# Patient Record
Sex: Male | Born: 1950 | Race: White | Hispanic: No | Marital: Married | Smoking: Former smoker
Health system: Southern US, Community
[De-identification: ages and names within clinical notes are randomized; demographics above are authoritative.]

## PROBLEM LIST (undated history)

## (undated) DIAGNOSIS — M25559 Pain in unspecified hip: Secondary | ICD-10-CM

## (undated) DIAGNOSIS — I4891 Unspecified atrial fibrillation: Secondary | ICD-10-CM

## (undated) DIAGNOSIS — K635 Polyp of colon: Secondary | ICD-10-CM

## (undated) DIAGNOSIS — N503 Cyst of epididymis: Secondary | ICD-10-CM

## (undated) DIAGNOSIS — K219 Gastro-esophageal reflux disease without esophagitis: Secondary | ICD-10-CM

## (undated) DIAGNOSIS — K759 Inflammatory liver disease, unspecified: Secondary | ICD-10-CM

## (undated) HISTORY — DX: Polyp of colon: K63.5

## (undated) HISTORY — PX: APPENDECTOMY: SHX54

## (undated) HISTORY — DX: Gastro-esophageal reflux disease without esophagitis: K21.9

---
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
## 2001-02-16 ENCOUNTER — Emergency Department (HOSPITAL_COMMUNITY): Admission: EM | Admit: 2001-02-16 | Discharge: 2001-02-16 | Payer: Self-pay | Admitting: *Deleted

## 2001-04-27 ENCOUNTER — Encounter: Payer: Self-pay | Admitting: Gastroenterology

## 2001-04-27 ENCOUNTER — Encounter: Admission: RE | Admit: 2001-04-27 | Discharge: 2001-04-27 | Payer: Self-pay | Admitting: Gastroenterology

## 2002-08-29 ENCOUNTER — Encounter (INDEPENDENT_AMBULATORY_CARE_PROVIDER_SITE_OTHER): Payer: Self-pay | Admitting: Specialist

## 2002-08-29 ENCOUNTER — Ambulatory Visit (HOSPITAL_BASED_OUTPATIENT_CLINIC_OR_DEPARTMENT_OTHER): Admission: RE | Admit: 2002-08-29 | Discharge: 2002-08-29 | Payer: Self-pay | Admitting: Urology

## 2002-08-29 HISTORY — PX: EPIDIDYMECTOMY: SHX478

## 2004-05-13 ENCOUNTER — Ambulatory Visit: Payer: Self-pay | Admitting: Gastroenterology

## 2005-06-03 ENCOUNTER — Encounter: Admission: RE | Admit: 2005-06-03 | Discharge: 2005-06-03 | Payer: Self-pay | Admitting: Neurology

## 2005-11-08 LAB — HM COLONOSCOPY: HM Colonoscopy: NORMAL

## 2006-07-11 HISTORY — PX: NASAL SINUS SURGERY: SHX719

## 2006-10-09 ENCOUNTER — Ambulatory Visit (HOSPITAL_BASED_OUTPATIENT_CLINIC_OR_DEPARTMENT_OTHER): Admission: RE | Admit: 2006-10-09 | Discharge: 2006-10-09 | Payer: Self-pay | Admitting: Otolaryngology

## 2006-10-09 ENCOUNTER — Encounter (INDEPENDENT_AMBULATORY_CARE_PROVIDER_SITE_OTHER): Payer: Self-pay | Admitting: Specialist

## 2010-01-29 ENCOUNTER — Ambulatory Visit: Payer: Self-pay | Admitting: Internal Medicine

## 2010-01-29 DIAGNOSIS — J309 Allergic rhinitis, unspecified: Secondary | ICD-10-CM | POA: Insufficient documentation

## 2010-01-29 DIAGNOSIS — R05 Cough: Secondary | ICD-10-CM | POA: Insufficient documentation

## 2010-01-29 DIAGNOSIS — Z860100 Personal history of colon polyps, unspecified: Secondary | ICD-10-CM | POA: Insufficient documentation

## 2010-01-29 DIAGNOSIS — K219 Gastro-esophageal reflux disease without esophagitis: Secondary | ICD-10-CM | POA: Insufficient documentation

## 2010-01-29 DIAGNOSIS — R059 Cough, unspecified: Secondary | ICD-10-CM | POA: Insufficient documentation

## 2010-01-29 LAB — CONVERTED CEMR LAB
ALT: 21 units/L (ref 0–53)
AST: 26 units/L (ref 0–37)
Albumin: 4.7 g/dL (ref 3.5–5.2)
Alkaline Phosphatase: 66 units/L (ref 39–117)
BUN: 13 mg/dL (ref 6–23)
Basophils Absolute: 0 10*3/uL (ref 0.0–0.1)
Basophils Relative: 0.4 % (ref 0.0–3.0)
Bilirubin, Direct: 0.2 mg/dL (ref 0.0–0.3)
CO2: 27 meq/L (ref 19–32)
Calcium: 9.8 mg/dL (ref 8.4–10.5)
Chloride: 104 meq/L (ref 96–112)
Cholesterol: 184 mg/dL (ref 0–200)
Creatinine, Ser: 0.8 mg/dL (ref 0.4–1.5)
Eosinophils Absolute: 0.2 10*3/uL (ref 0.0–0.7)
Eosinophils Relative: 3 % (ref 0.0–5.0)
GFR calc non Af Amer: 106.5 mL/min (ref >60)
Glucose, Bld: 102 mg/dL — ABNORMAL HIGH (ref 70–99)
HCT: 48.4 % (ref 39.0–52.0)
HDL: 81.8 mg/dL (ref >39.00)
Hemoglobin: 17 g/dL (ref 13.0–17.0)
LDL Cholesterol: 91 mg/dL (ref 0–99)
Lymphocytes Relative: 20.6 % (ref 12.0–46.0)
Lymphs Abs: 1.5 10*3/uL (ref 0.7–4.0)
MCHC: 35 g/dL (ref 30.0–36.0)
MCV: 95 fL (ref 78.0–100.0)
Monocytes Absolute: 0.5 10*3/uL (ref 0.1–1.0)
Monocytes Relative: 6.7 % (ref 3.0–12.0)
Neutro Abs: 4.9 10*3/uL (ref 1.4–7.7)
Neutrophils Relative %: 69.3 % (ref 43.0–77.0)
Platelets: 218 10*3/uL (ref 150.0–400.0)
Potassium: 4.8 meq/L (ref 3.5–5.1)
RBC: 5.09 M/uL (ref 4.22–5.81)
RDW: 12.9 % (ref 11.5–14.6)
Sodium: 141 meq/L (ref 135–145)
TSH: 1.36 microintl units/mL (ref 0.35–5.50)
Total Bilirubin: 1.2 mg/dL (ref 0.3–1.2)
Total CHOL/HDL Ratio: 2
Total Protein: 7.1 g/dL (ref 6.0–8.3)
Triglycerides: 55 mg/dL (ref 0.0–149.0)
VLDL: 11 mg/dL (ref 0.0–40.0)
WBC: 7.1 10*3/uL (ref 4.5–10.5)

## 2010-02-03 ENCOUNTER — Ambulatory Visit: Payer: Self-pay | Admitting: Internal Medicine

## 2010-08-10 NOTE — Letter (Signed)
Summary: Out of Work  [facility]  [address] [place], [place] [address]   Phone: [phone]  Fax: [phone]    January 29, 2010   Employee:  GARON MELANDER    To Whom It May Concern:   For Medical reasons, please excuse the above named employee from work for the following dates:  Start:   01/25/10  End:   02/01/10  If you need additional information, please feel free to contact our office.         Sincerely,    Etta Grandchild MD

## 2010-08-10 NOTE — Assessment & Plan Note (Signed)
Summary: NEW PT PHY/BCBS/#/CD---STC   Vital Signs:  Patient profile:   60 year old male Height:      72 inches Weight:      160 pounds BMI:     21.78 O2 Sat:      98 % on Room air Temp:     97.5 degrees F oral Pulse rate:   75 / minute Pulse rhythm:   regular Resp:     16 per minute BP sitting:   128 / 82  (left arm) Cuff size:   large  Vitals Entered By: Rock Nephew CMA (January 29, 2010 9:16 AM)  O2 Flow:  Room air CC: New pt physical w/ labs// pt wants to discuss a cxr order, Preventive Care Is Patient Diabetic? No Pain Assessment Patient in pain? no        Primary Care Provider:  Etta Grandchild MD  CC:  New pt physical w/ labs// pt wants to discuss a cxr order and Preventive Care.  History of Present Illness: New to me he wants a complete physical done.  Preventive Screening-Counseling & Management  Alcohol-Tobacco     Alcohol drinks/day: <1     Alcohol type: all     >5/day in last 3 mos: no     Alcohol Counseling: not indicated; use of alcohol is not excessive or problematic     Feels need to cut down: no     Feels annoyed by complaints: no     Feels guilty re: drinking: no     Needs 'eye opener' in am: no     Smoking Status: quit > 6 months     Year Started: 1964     Year Quit: 1074     Pack years: 10     Tobacco Counseling: to remain off tobacco products  Caffeine-Diet-Exercise     Does Patient Exercise: yes  Hep-HIV-STD-Contraception     Hepatitis Risk: no risk noted     HIV Risk: no risk noted     STD Risk: no risk noted     TSE monthly: yes     Testicular SE Education/Counseling to perform regular STE  Safety-Violence-Falls     Seat Belt Use: yes     Helmet Use: yes     Firearms in the Home: no firearms in the home     Smoke Detectors: yes     Violence in the Home: no risk noted      Sexual History:  currently monogamous.        Drug Use:  never and no.        Blood Transfusions:  no.    Medications Prior to Update: 1)   None  Current Medications (verified): 1)  Vitamin D 1000 Unit Tabs (Cholecalciferol) .... Take 1 Tablet By Mouth Once A Day 2)  Multi Vitamin 3)  Asa 325mg  .... As Needed 4)  Betamethasone Dipropionate 0.05 % Crea (Betamethasone Dipropionate) .... As Needed 5)  Fluticasone Propionate 50 Mcg/act Susp (Fluticasone Propionate) .... As Directed  Allergies (verified): No Known Drug Allergies  Past History:  Family History: Last updated: 01/29/2010 Family History of Arthritis Family History of Stroke M 1st degree relative <50  Social History: Last updated: 01/29/2010 Retired Married Alcohol use-no Drug use-no Regular exercise-yes  Risk Factors: Alcohol Use: <1 (01/29/2010) >5 drinks/d w/in last 3 months: no (01/29/2010) Exercise: yes (01/29/2010)  Risk Factors: Smoking Status: quit > 6 months (01/29/2010)  Past Medical History: Allergic rhinitis GERD Colonic polyps, hx of  Past Surgical History: Epididyectomy 2004- he sees Dr. Jari Sportsman twice a year, last PSA=0.41 Oct 2010 Appendectomy  Family History: Reviewed history and no changes required. Family History of Arthritis Family History of Stroke M 1st degree relative <50  Social History: Reviewed history and no changes required. Retired Married Alcohol use-no Drug use-no Regular exercise-yes Drug Use:  never, no Does Patient Exercise:  yes Smoking Status:  quit > 6 months Hepatitis Risk:  no risk noted HIV Risk:  no risk noted STD Risk:  no risk noted Seat Belt Use:  yes Sexual History:  currently monogamous Blood Transfusions:  no  Review of Systems  The patient denies anorexia, fever, weight loss, weight gain, chest pain, syncope, dyspnea on exertion, peripheral edema, prolonged cough, headaches, hemoptysis, abdominal pain, melena, hematochezia, severe indigestion/heartburn, hematuria, suspicious skin lesions, difficulty walking, depression, enlarged lymph nodes, angioedema, and testicular masses.     Physical Exam  General:  alert, well-developed, well-nourished, well-hydrated, appropriate dress, normal appearance, healthy-appearing, cooperative to examination, and good hygiene.   Head:  normocephalic, atraumatic, no abnormalities observed, and no abnormalities palpated.   Eyes:  vision grossly intact.   Ears:  R ear normal and L ear normal.   Mouth:  Oral mucosa and oropharynx without lesions or exudates.  Teeth in good repair. Neck:  supple, full ROM, no masses, no thyromegaly, no thyroid nodules or tenderness, no JVD, normal carotid upstroke, no carotid bruits, no cervical lymphadenopathy, and no neck tenderness.   Chest Wall:  No deformities, masses, tenderness or gynecomastia noted. Breasts:  No masses or gynecomastia noted Lungs:  Normal respiratory effort, chest expands symmetrically. Lungs are clear to auscultation, no crackles or wheezes. Heart:  Normal rate and regular rhythm. S1 and S2 normal without gallop, murmur, click, rub or other extra sounds. Abdomen:  soft, non-tender, normal bowel sounds, no distention, no masses, no guarding, no rigidity, no rebound tenderness, no abdominal hernia, no inguinal hernia, no hepatomegaly, no splenomegaly, and abdominal scar(s).   Rectal:  No external abnormalities noted. Normal sphincter tone. No rectal masses or tenderness. Genitalia:  circumcised, no scrotal masses, no testicular masses or atrophy, no cutaneous lesions, no urethral discharge, and R spermatocele.   Prostate:  no gland enlargement, no nodules, no asymmetry, and no induration.   Msk:  normal ROM, no joint tenderness, no joint swelling, no joint warmth, no redness over joints, no joint deformities, no joint instability, no crepitation, and no muscle atrophy.   Pulses:  R and L carotid,radial,femoral,dorsalis pedis and posterior tibial pulses are full and equal bilaterally Extremities:  No clubbing, cyanosis, edema, or deformity noted with normal full range of motion of all  joints.   Neurologic:  No cranial nerve deficits noted. Station and gait are normal. Plantar reflexes are down-going bilaterally. DTRs are symmetrical throughout. Sensory, motor and coordinative functions appear intact. Skin:  turgor normal, no rashes, no suspicious lesions, no ecchymoses, no petechiae, no purpura, no ulcerations, no edema, and excessive tan.   Cervical Nodes:  no anterior cervical adenopathy and no posterior cervical adenopathy.   Axillary Nodes:  no R axillary adenopathy and no L axillary adenopathy.   Inguinal Nodes:  no R inguinal adenopathy and no L inguinal adenopathy.   Psych:  Cognition and judgment appear intact. Alert and cooperative with normal attention span and concentration. No apparent delusions, illusions, hallucinations   Impression & Recommendations:  Problem # 1:  ROUTINE GENERAL MEDICAL EXAM@HEALTH  CARE FACL (ICD-V70.0) Assessment New  Orders: Venipuncture ([address]) TLB-Lipid Panel (80061-LIPID)  TLB-BMP (Basic Metabolic Panel-BMET) (80048-METABOL) TLB-CBC Platelet - w/Differential (85025-CBCD) TLB-Hepatic/Liver Function Pnl (80076-HEPATIC) TLB-TSH (Thyroid Stimulating Hormone) (84443-TSH) EKG w/ Interpretation (93000)  Colonoscopy: normal (11/08/2005) Td Booster: Historical (07/11/2008)    Discussed using sunscreen, use of alcohol, drug use, self testicular exam, routine dental care, routine eye care, routine physical exam, seat belts, multiple vitamins,  and recommendations for immunizations.  Discussed exercise and checking cholesterol.  Also recommend checking PSA.  Complete Medication List: 1)  Vitamin D 1000 Unit Tabs (Cholecalciferol) .... Take 1 tablet by mouth once a day 2)  Multi Vitamin  3)  Asa 325mg   .... As needed 4)  Betamethasone Dipropionate 0.05 % Crea (Betamethasone dipropionate) .... As needed 5)  Fluticasone Propionate 50 Mcg/act Susp (Fluticasone propionate) .... As directed  Other Orders: T-2 View CXR (71020TC)  Colorectal  Screening:  Current Recommendations:    Hemoccult: NEG X 1 today    Colonoscopy recommended: scheduled with G.I.  PSA Screening:    Reviewed PSA screening recommendations: patient defers PSA but will reconsider in the future  Immunization & Chemoprophylaxis:    Tetanus vaccine: Historical  (07/11/2008)  Patient Instructions: 1)  Please schedule a follow-up appointment as needed. 2)  Schedule a colonoscopy/sigmoidoscopy to help detect colon cancer.  Preventive Care Screening  Last Tetanus Booster:    Date:  07/11/2008    Results:  Historical   Colonoscopy:    Date:  11/08/2005    Results:  normal

## 2010-11-26 NOTE — Op Note (Signed)
NAME:  Mark Cohen, Mark Cohen                       ACCOUNT NO.:  [id number]   MEDICAL RECORD NO.:  [id number]                   PATIENT TYPE:  AMB   LOCATION:  [facility]                                 FACILITY:  [facility]   PHYSICIAN:  Sigmund I. Patsi Sears, M.D.         DATE OF BIRTH:  18-Jul-1950   DATE OF PROCEDURE:  08/29/2002  DATE OF DISCHARGE:                                 OPERATIVE REPORT   PREOPERATIVE DIAGNOSIS:  Multiloculated left scrotal mass and varicocele.   POSTOPERATIVE DIAGNOSIS:  Multiloculated left scrotal mass and varicocele.   OPERATION:  1. Left internal spermatic vein ligation.  2. Left epididymal cyst removal.   SURGEON:  Sigmund I. Patsi Sears, M.D.   ANESTHESIA:  General (LMA).   PREPARATION:  After appropriate preanesthesia, the patient is brought to the  operating room and placed on the operating table in the dorsal supine  position where general LMA anesthesia was introduced.  He remained in this  position where the pubis was prepped with Betadine solution and draped in  the usual fashion.   DESCRIPTION OF PROCEDURE:  A left inguinal incision was made, measuring 12  cm, and subcutaneous tissue was dissected with the electrosurgical unit.  The fascia of the external oblique was entered, with care taken to avoid  injury to the ilioinguinal nerve.  The pubis was identified, and a Penrose  drain was placed at the level of the pubis.  The spermatic cord was then  dissected, and several large left spermatic veins were identified as well as  lymphatics, and these were all ligated with 3-0 silk suture.  Following  this, 0.25% Marcaine was injected into the spermatic cord and the wound  edges.   The testicle was then delivered into the wound, and the testicle was noted  to measure about 3 cm and was somewhat soft.  A very large, multiloculated  cystic mass was identified, and this was dissected.  This required incision  of the cyst and drainage of the cyst, in  order to remove the cyst and save  the vasculature to the testicle.  The previously dissected sperm granuloma  from prior vasectomy was excised as well.  The patient tolerated the  procedure well, and testicle was placed into the scrotum and sutured in  place with 3-0 Vicryl suture.  The patient underwent closure with 3-0 PDS  closure of the external iliac fascia and then 3-0 Vicryl closure of Scarpa's  fascia.  The skin was closed with skin stapler.  The patient was awakened  and taken to recovery room in good condition.                                               Sigmund I. Patsi Sears, M.D.    SIT/MEDQ  D:  08/29/2002  T:  08/29/2002  Job:  [id number]  

## 2010-11-26 NOTE — Op Note (Signed)
NAME:  Mark Cohen, Mark Cohen             ACCOUNT NO.:  [id number]   MEDICAL RECORD NO.:  [id number]          PATIENT TYPE:  AMB   LOCATION:  [facility]                          FACILITY:  [facility]   PHYSICIAN:  Jefry H. Pollyann Kennedy, MD     DATE OF BIRTH:  12/04/1950   DATE OF PROCEDURE:  10/09/2006  DATE OF DISCHARGE:                               OPERATIVE REPORT   PREOPERATIVE DIAGNOSIS:  Chronic ethmoid and maxillary sinusitis.   POSTOPERATIVE DIAGNOSIS:  Chronic ethmoid and maxillary sinusitis.   PROCEDURE:  Bilateral anterior endoscopic ethmoidectomy and bilateral  endoscopic maxillary antrostomy.   SURGEON:  Dr. Pollyann Kennedy   ANESTHESIA:  General endotracheal anesthesia was used.   COMPLICATIONS:  No complications.   BLOOD LOSS:  Less than 50 mL.   FINDINGS:  Diffuse polypoid and hyperplastic mucosa throughout the  ethmoid complex and the medial aspect of the maxillary sinuses  bilaterally.  There is a bony dehiscence of the left fovea but there was  no dura exposed and no CSF leak.  There was a bony dehiscence of the  right lamina papyracea but again no orbital contents or periorbita  exposed.   REFERRING PHYSICIAN:  [name]. Cohen, M.D.   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  Risks, benefits, alternatives,  complications of procedure explained to the patient who seemed to  understand and agreed to surgery.   PROCEDURE:  The patient was taken to the operating room, placed on the  operating table in supine position.  Following induction of general  endotracheal anesthesia the patient was prepped and draped in standard  fashion.  Afrin spray was used preoperatively in the nasal cavities.  1%  Xylocaine with epinephrine was infiltrated into the superior and  posterior attachments of the middle turbinates and a lateral nasal wall.   1. Bilateral  anterior endoscopic ethmoidectomy.  The uncinate process      was transected at its base with a sickle knife and then resected      with Wild Blakesley forceps.  A 0 and 30 degrees nasal endoscopes      were used throughout the procedure.  The microdebrider was then      used to complete an anterior ethmoid dissection.  The lateral limit      of dissection was the lamina papyracea bilaterally, the superior      limit the fovea and the posterior limit the ground lamella which      was kept intact.  Hyperplastic mucosa and multiple large polyps      were removed with other disease tissue.  There is no fungal type      debris or pus obtained.  This similar procedure was performed on      both sides.  The frontal recess was explored and on the left side      some hyperplastic mucosa was cleaned out of the frontal duct.  The      frontal duct was packed with Gelfoam bilaterally to  ensure patency      postoperatively.  Kennedy packs were placed in the ethmoid cavities      bilaterally at the termination of the procedure.   1. Bilateral maxillary antrostomy.  After the initial uncinate and the      bulla dissection the fontanelle was explored with a curved suction      and the maxillary sinus was entered on both sides.  A backbiting      forceps was used to enlarge the ostia anteriorly and on the left      side a flag forceps was used to enlarge inferiorly.  Posteriorly      the dissection was performed using a straight and angled Tru-Cut      forceps.  After the opening was enlarged as much as possible the      procedure was completed.  The nasal cavities and pharynx were      suctioned of blood and secretions.  The patient was awakened,      extubated, transferred to recovery in stable condition.      Jefry H. Pollyann Kennedy, MD  Electronically Signed     JHR/MEDQ  D:  10/09/2006  T:  10/09/2006  Job:  [id number]   cc:   Mark Cohen, M.D.

## 2011-04-22 ENCOUNTER — Encounter: Payer: Self-pay | Admitting: Internal Medicine

## 2011-04-28 ENCOUNTER — Other Ambulatory Visit (INDEPENDENT_AMBULATORY_CARE_PROVIDER_SITE_OTHER): Payer: BC Managed Care – PPO

## 2011-04-28 ENCOUNTER — Ambulatory Visit (INDEPENDENT_AMBULATORY_CARE_PROVIDER_SITE_OTHER): Payer: BC Managed Care – PPO | Admitting: Internal Medicine

## 2011-04-28 ENCOUNTER — Encounter: Payer: Self-pay | Admitting: Internal Medicine

## 2011-04-28 VITALS — BP 126/82 | HR 68 | Temp 98.2°F | Resp 16 | Wt 157.8 lb

## 2011-04-28 DIAGNOSIS — Z0000 Encounter for general adult medical examination without abnormal findings: Secondary | ICD-10-CM

## 2011-04-28 DIAGNOSIS — Z136 Encounter for screening for cardiovascular disorders: Secondary | ICD-10-CM | POA: Insufficient documentation

## 2011-04-28 LAB — LIPID PANEL
Cholesterol: 165 mg/dL (ref 0–200)
HDL: 78.4 mg/dL (ref >39.00)
Triglycerides: 28 mg/dL (ref 0.0–149.0)
VLDL: 5.6 mg/dL (ref 0.0–40.0)

## 2011-04-28 LAB — CBC WITH DIFFERENTIAL/PLATELET
Eosinophils Absolute: 0.2 10*3/uL (ref 0.0–0.7)
Lymphs Abs: 1.5 10*3/uL (ref 0.7–4.0)
MCHC: 33.5 g/dL (ref 30.0–36.0)
MCV: 95.8 fl (ref 78.0–100.0)
Monocytes Absolute: 0.5 10*3/uL (ref 0.1–1.0)
Neutrophils Relative %: 64.4 % (ref 43.0–77.0)
Platelets: 217 10*3/uL (ref 150.0–400.0)
RDW: 13 % (ref 11.5–14.6)

## 2011-04-28 LAB — URINALYSIS, ROUTINE W REFLEX MICROSCOPIC
Bilirubin Urine: NEGATIVE
Leukocytes, UA: NEGATIVE
Nitrite: NEGATIVE
Specific Gravity, Urine: >=1.03 (ref 1.000–1.030)
Total Protein, Urine: NEGATIVE
pH: 5.5 (ref 5.0–8.0)

## 2011-04-28 LAB — COMPREHENSIVE METABOLIC PANEL
AST: 27 U/L (ref 0–37)
Albumin: 4.3 g/dL (ref 3.5–5.2)
Alkaline Phosphatase: 65 U/L (ref 39–117)
Potassium: 4.6 mEq/L (ref 3.5–5.1)
Sodium: 141 mEq/L (ref 135–145)
Total Bilirubin: 1.3 mg/dL — ABNORMAL HIGH (ref 0.3–1.2)
Total Protein: 6.7 g/dL (ref 6.0–8.3)

## 2011-04-28 LAB — PSA: PSA: 0.4 ng/mL (ref 0.10–4.00)

## 2011-04-28 NOTE — Assessment & Plan Note (Signed)
His EKG is normal 

## 2011-04-28 NOTE — Progress Notes (Signed)
  Subjective:    Patient ID: Mark Cohen, male    DOB: June 20, 1951, 60 y.o.   MRN: [id number]  HPI He returns for a physical and he tells me that he feels well.   Review of Systems  Constitutional: Negative.   HENT: Negative.   Eyes: Negative.   Respiratory: Negative.   Cardiovascular: Negative.   Gastrointestinal: Negative.   Genitourinary: Negative.   Musculoskeletal: Negative.   Skin: Negative.   Neurological: Negative.   Hematological: Negative.   Psychiatric/Behavioral: Negative.        Objective:   Physical Exam  Vitals reviewed. Constitutional: He is oriented to person, place, and time. He appears well-developed and well-nourished. No distress.  HENT:  Head: Normocephalic.  Mouth/Throat: Oropharynx is clear and moist. No oropharyngeal exudate.  Eyes: Conjunctivae are normal. Right eye exhibits no discharge. Left eye exhibits no discharge. No scleral icterus.  Neck: Normal range of motion. Neck supple. No JVD present. No tracheal deviation present. No thyromegaly present.  Cardiovascular: Normal rate, regular rhythm, normal heart sounds and intact distal pulses.  Exam reveals no gallop and no friction rub.   No murmur heard. Pulmonary/Chest: Effort normal and breath sounds normal. No stridor. No respiratory distress. He has no wheezes. He has no rales. He exhibits no tenderness.  Abdominal: Soft. Bowel sounds are normal. He exhibits no distension and no mass. There is no tenderness. There is no rebound and no guarding. Hernia confirmed negative in the right inguinal area and confirmed negative in the left inguinal area.  Genitourinary: Rectum normal, testes normal and penis normal. Rectal exam shows no external hemorrhoid, no internal hemorrhoid, no fissure, no mass, no tenderness and anal tone normal. Guaiac negative stool. Prostate is not enlarged and not tender. Right testis shows no mass, no swelling and no tenderness. Right testis is descended. Left testis shows no  mass, no swelling and no tenderness. Left testis is descended. Circumcised. No penile tenderness. No discharge found.  Musculoskeletal: Normal range of motion. He exhibits no edema and no tenderness.  Lymphadenopathy:    He has no cervical adenopathy.       Right: No inguinal adenopathy present.       Left: No inguinal adenopathy present.  Neurological: He is oriented to person, place, and time. He displays normal reflexes. He exhibits normal muscle tone. Coordination normal.  Skin: Skin is warm and dry. No rash noted. He is not diaphoretic. No erythema. No pallor.  Psychiatric: He has a normal mood and affect. His behavior is normal. Judgment and thought content normal.          Assessment & Plan:

## 2011-04-28 NOTE — Assessment & Plan Note (Signed)
Exam done, labs ordered, pt ed material was given 

## 2011-04-28 NOTE — Patient Instructions (Signed)
Health Maintenance, Males A healthy lifestyle and preventative care can promote health and wellness.  Maintain regular health, dental, and eye exams.   Eat a healthy diet. Foods like vegetables, fruits, whole grains, low-fat dairy products, and lean protein foods contain the nutrients you need without too many calories. Decrease your intake of foods high in solid fats, added sugars, and salt. Get information about a proper diet from your caregiver, if necessary.   Regular physical exercise is one of the most important things you can do for your health. Most adults should get at least 150 minutes of moderate-intensity exercise (any activity that increases your heart rate and causes you to sweat) each week. In addition, most adults need muscle-strengthening exercises on 2 or more days a week.    Maintain a healthy weight. The body mass index (BMI) is a screening tool to identify possible weight problems. It provides an estimate of body fat based on height and weight. Your caregiver can help determine your BMI, and can help you achieve or maintain a healthy weight. For adults 20 years and older:   A BMI below 18.5 is considered underweight.   A BMI of 18.5 to 24.9 is normal.   A BMI of 25 to 29.9 is considered overweight.   A BMI of 30 and above is considered obese.   Maintain normal blood lipids and cholesterol by exercising and minimizing your intake of saturated fat. Eat a balanced diet with plenty of fruits and vegetables. Blood tests for lipids and cholesterol should begin at age 20 and be repeated every 5 years. If your lipid or cholesterol levels are high, you are over 50, or you are a high risk for heart disease, you may need your cholesterol levels checked more frequently.Ongoing high lipid and cholesterol levels should be treated with medicines, if diet and exercise are not effective.   If you smoke, find out from your caregiver how to quit. If you do not use tobacco, do not start.    If you choose to drink alcohol, do not exceed 2 drinks per day. One drink is considered to be 12 ounces (355 mL) of beer, 5 ounces (148 mL) of wine, or 1.5 ounces (44 mL) of liquor.   Avoid use of street drugs. Do not share needles with anyone. Ask for help if you need support or instructions about stopping the use of drugs.   High blood pressure causes heart disease and increases the risk of stroke. Blood pressure should be checked at least every 1 to 2 years. Ongoing high blood pressure should be treated with medicines if weight loss and exercise are not effective.   If you are 45 to 61 years old, ask your caregiver if you should take aspirin to prevent heart disease.   Diabetes screening involves taking a blood sample to check your fasting blood sugar level. This should be done once every 3 years, after age 45, if you are within normal weight and without risk factors for diabetes. Testing should be considered at a younger age or be carried out more frequently if you are overweight and have at least 1 risk factor for diabetes.   Colorectal cancer can be detected and often prevented. Most routine colorectal cancer screening begins at the age of 50 and continues through age 75. However, your caregiver may recommend screening at an earlier age if you have risk factors for colon cancer. On a yearly basis, your caregiver may provide home test kits to check for hidden   blood in the stool. Use of a small camera at the end of a tube, to directly examine the colon (sigmoidoscopy or colonoscopy), can detect the earliest forms of colorectal cancer. Talk to your caregiver about this at age 50, when routine screening begins. Direct examination of the colon should be repeated every 5 to 10 years through age 75, unless early forms of pre-cancerous polyps or small growths are found.   Healthy men should no longer receive prostate-specific antigen (PSA) blood tests as part of routine cancer screening. Consult with  your caregiver about prostate cancer screening.   Practice safe sex. Use condoms and avoid high-risk sexual practices to reduce the spread of sexually transmitted infections (STIs).   Use sunscreen with a sun protection factor (SPF) of 30 or greater. Apply sunscreen liberally and repeatedly throughout the day. You should seek shade when your shadow is shorter than you. Protect yourself by wearing long sleeves, pants, a wide-brimmed hat, and sunglasses year round, whenever you are outdoors.   Notify your caregiver of new moles or changes in moles, especially if there is a change in shape or color. Also notify your caregiver if a mole is larger than the size of a pencil eraser.   A one-time screening for abdominal aortic aneurysm (AAA) and surgical repair of large AAAs by sound wave imaging (ultrasonography) is recommended for ages 65 to 75 years who are current or former smokers.   Stay current with your immunizations.  Document Released: 12/24/2007 Document Revised: 03/09/2011 Document Reviewed: 11/22/2010 [employer] Patient Information 2012 [employer]. 

## 2011-07-29 ENCOUNTER — Other Ambulatory Visit: Payer: Self-pay | Admitting: Gastroenterology

## 2011-08-12 ENCOUNTER — Ambulatory Visit (INDEPENDENT_AMBULATORY_CARE_PROVIDER_SITE_OTHER): Payer: BC Managed Care – PPO | Admitting: Internal Medicine

## 2011-08-12 ENCOUNTER — Encounter: Payer: Self-pay | Admitting: Internal Medicine

## 2011-08-12 ENCOUNTER — Other Ambulatory Visit (INDEPENDENT_AMBULATORY_CARE_PROVIDER_SITE_OTHER): Payer: BC Managed Care – PPO

## 2011-08-12 VITALS — BP 144/96 | HR 70 | Temp 97.8°F | Resp 16 | Wt 160.0 lb

## 2011-08-12 DIAGNOSIS — M25552 Pain in left hip: Secondary | ICD-10-CM

## 2011-08-12 DIAGNOSIS — M545 Low back pain, unspecified: Secondary | ICD-10-CM

## 2011-08-12 DIAGNOSIS — I10 Essential (primary) hypertension: Secondary | ICD-10-CM

## 2011-08-12 DIAGNOSIS — M25559 Pain in unspecified hip: Secondary | ICD-10-CM

## 2011-08-12 DIAGNOSIS — M5416 Radiculopathy, lumbar region: Secondary | ICD-10-CM | POA: Insufficient documentation

## 2011-08-12 DIAGNOSIS — R739 Hyperglycemia, unspecified: Secondary | ICD-10-CM | POA: Insufficient documentation

## 2011-08-12 DIAGNOSIS — R7309 Other abnormal glucose: Secondary | ICD-10-CM

## 2011-08-12 LAB — BASIC METABOLIC PANEL
BUN: 13 mg/dL (ref 6–23)
Calcium: 9.4 mg/dL (ref 8.4–10.5)
Creatinine, Ser: 0.8 mg/dL (ref 0.4–1.5)
GFR: 102.94 mL/min (ref >60.00)
Glucose, Bld: 101 mg/dL — ABNORMAL HIGH (ref 70–99)

## 2011-08-12 MED ORDER — OLMESARTAN MEDOXOMIL 40 MG PO TABS: 40.0000 mg | ORAL_TABLET | Freq: Every day | ORAL | Status: DC

## 2011-08-12 MED ORDER — METHYLPREDNISOLONE 4 MG PO KIT: PACK | ORAL | Status: AC

## 2011-08-12 NOTE — Assessment & Plan Note (Signed)
He will take a course of medrol dose pak for the pain and today I will check plain films to look for spondylosis, occult fracture, etc

## 2011-08-12 NOTE — Progress Notes (Signed)
Subjective:    Patient ID: Mark Cohen, male    DOB: 04/22/1951, 61 y.o.   MRN: [id number]  Hypertension This is a new problem. The current episode started more than 1 month ago. The problem has been gradually worsening since onset. The problem is uncontrolled. Pertinent negatives include no anxiety, blurred vision, chest pain, headaches, malaise/fatigue, neck pain, orthopnea, palpitations, peripheral edema, PND, shortness of breath or sweats.  Back Pain This is a chronic problem. The current episode started more than 1 month ago. The problem occurs intermittently. The problem is unchanged. The pain is present in the lumbar spine. The quality of the pain is described as aching. The pain radiates to the left thigh. The pain is at a severity of 2/10. The pain is mild. The pain is worse during the day. The symptoms are aggravated by position. Stiffness is present in the morning. Associated symptoms include leg pain (left hip). Pertinent negatives include no abdominal pain, bladder incontinence, bowel incontinence, chest pain, dysuria, fever, headaches, numbness, paresis, paresthesias, pelvic pain, perianal numbness, tingling, weakness or weight loss. Risk factors include history of steroid use. Treatments tried: steroids. The treatment provided significant relief.      Review of Systems  Constitutional: Negative for fever, chills, weight loss, malaise/fatigue, diaphoresis, activity change, appetite change, fatigue and unexpected weight change.  HENT: Negative.  Negative for neck pain.   Eyes: Negative.  Negative for blurred vision.  Respiratory: Negative for cough, chest tightness, shortness of breath and wheezing.   Cardiovascular: Negative for chest pain, palpitations, orthopnea, leg swelling and PND.  Gastrointestinal: Negative for nausea, vomiting, abdominal pain, diarrhea, constipation, blood in stool, abdominal distention, anal bleeding and bowel incontinence.  Genitourinary: Negative.   Negative for bladder incontinence, dysuria and pelvic pain.  Musculoskeletal: Positive for back pain and arthralgias (left hip). Negative for myalgias, joint swelling and gait problem.  Skin: Negative for color change, pallor, rash and wound.  Neurological: Negative for dizziness, tingling, tremors, seizures, syncope, facial asymmetry, speech difficulty, weakness, light-headedness, numbness, headaches and paresthesias.  Hematological: Negative for adenopathy. Does not bruise/bleed easily.  Psychiatric/Behavioral: Negative.        Objective:   Physical Exam  Vitals reviewed. Constitutional: He is oriented to person, place, and time. He appears well-developed and well-nourished. No distress.  HENT:  Head: Normocephalic and atraumatic.  Mouth/Throat: Oropharynx is clear and moist. No oropharyngeal exudate.  Eyes: Conjunctivae are normal. Right eye exhibits no discharge. Left eye exhibits no discharge. No scleral icterus.  Neck: Normal range of motion. Neck supple. No JVD present. No tracheal deviation present. No thyromegaly present.  Cardiovascular: Normal rate, regular rhythm, normal heart sounds and intact distal pulses.  Exam reveals no gallop and no friction rub.   No murmur heard. Pulmonary/Chest: Effort normal and breath sounds normal. No stridor. No respiratory distress. He has no wheezes. He has no rales. He exhibits no tenderness.  Abdominal: Soft. Bowel sounds are normal. He exhibits no distension and no mass. There is no tenderness. There is no rebound and no guarding.  Musculoskeletal: Normal range of motion. He exhibits no edema and no tenderness.       Left hip: Normal. He exhibits normal range of motion, normal strength, no tenderness, no bony tenderness, no swelling, no crepitus, no deformity and no laceration.       Lumbar back: Normal. He exhibits normal range of motion, no tenderness, no bony tenderness, no swelling, no edema, no deformity, no laceration, no pain, no spasm  and  normal pulse.  Lymphadenopathy:    He has no cervical adenopathy.  Neurological: He is alert and oriented to person, place, and time. He has normal strength. He displays no atrophy, no tremor and normal reflexes. No cranial nerve deficit or sensory deficit. He exhibits normal muscle tone. He displays a negative Romberg sign. He displays no seizure activity. Coordination and gait normal. He displays no Babinski's sign on the right side. He displays no Babinski's sign on the left side.  Reflex Scores:      Tricep reflexes are 1+ on the right side.      Bicep reflexes are 1+ on the right side and 1+ on the left side.      Brachioradialis reflexes are 1+ on the right side and 1+ on the left side.      Patellar reflexes are 1+ on the right side and 1+ on the left side.      Achilles reflexes are 1+ on the right side and 1+ on the left side. Skin: Skin is warm and dry. No rash noted. He is not diaphoretic. No erythema. No pallor.  Psychiatric: He has a normal mood and affect. His behavior is normal. Judgment and thought content normal.      Lab Results  Component Value Date   WBC 6.1 04/28/2011   HGB 16.0 04/28/2011   HCT 47.7 04/28/2011   PLT 217.0 04/28/2011   GLUCOSE 107* 04/28/2011   CHOL 165 04/28/2011   TRIG 28.0 04/28/2011   HDL 78.40 04/28/2011   LDLCALC 81 04/28/2011   ALT 23 04/28/2011   AST 27 04/28/2011   NA 141 04/28/2011   K 4.6 04/28/2011   CL 105 04/28/2011   CREATININE 0.9 04/28/2011   BUN 11 04/28/2011   CO2 29 04/28/2011   TSH 1.29 04/28/2011   PSA 0.40 04/28/2011      Assessment & Plan:

## 2011-08-12 NOTE — Assessment & Plan Note (Signed)
a1c today to check for DM II

## 2011-08-12 NOTE — Patient Instructions (Signed)
Back Pain, Adult Low back pain is very common. About 1 in 5 people have back pain.The cause of low back pain is rarely dangerous. The pain often gets better over time.About half of people with a sudden onset of back pain feel better in just 2 weeks. About 8 in 10 people feel better by 6 weeks.  CAUSES Some common causes of back pain include:  Strain of the muscles or ligaments supporting the spine.   Wear and tear (degeneration) of the spinal discs.   Arthritis.   Direct injury to the back.  DIAGNOSIS Most of the time, the direct cause of low back pain is not known.However, back pain can be treated effectively even when the exact cause of the pain is unknown.Answering your caregiver's questions about your overall health and symptoms is one of the most accurate ways to make sure the cause of your pain is not dangerous. If your caregiver needs more information, he or she may order lab work or imaging tests (X-rays or MRIs).However, even if imaging tests show changes in your back, this usually does not require surgery. HOME CARE INSTRUCTIONS For many people, back pain returns.Since low back pain is rarely dangerous, it is often a condition that people can learn to manageon their own.   Remain active. It is stressful on the back to sit or stand in one place. Do not sit, drive, or stand in one place for more than 30 minutes at a time. Take short walks on level surfaces as soon as pain allows.Try to increase the length of time you walk each day.   Do not stay in bed.Resting more than 1 or 2 days can delay your recovery.   Do not avoid exercise or work.Your body is made to move.It is not dangerous to be active, even though your back may hurt.Your back will likely heal faster if you return to being active before your pain is gone.   Pay attention to your body when you bend and lift. Many people have less discomfortwhen lifting if they bend their knees, keep the load close to their  bodies,and avoid twisting. Often, the most comfortable positions are those that put less stress on your recovering back.   Find a comfortable position to sleep. Use a firm mattress and lie on your side with your knees slightly bent. If you lie on your back, put a pillow under your knees.   Only take over-the-counter or prescription medicines as directed by your caregiver. Over-the-counter medicines to reduce pain and inflammation are often the most helpful.Your caregiver may prescribe muscle relaxant drugs.These medicines help dull your pain so you can more quickly return to your normal activities and healthy exercise.   Put ice on the injured area.   Put ice in a plastic bag.   Place a towel between your skin and the bag.   Leave the ice on for 15 to 20 minutes, 3 to 4 times a day for the first 2 to 3 days. After that, ice and heat may be alternated to reduce pain and spasms.   Ask your caregiver about trying back exercises and gentle massage. This may be of some benefit.   Avoid feeling anxious or stressed.Stress increases muscle tension and can worsen back pain.It is important to recognize when you are anxious or stressed and learn ways to manage it.Exercise is a great option.  SEEK MEDICAL CARE IF:  You have pain that is not relieved with rest or medicine.   You have   pain that does not improve in 1 week.   You have new symptoms.   You are generally not feeling well.  SEEK IMMEDIATE MEDICAL CARE IF:   You have pain that radiates from your back into your legs.   You develop new bowel or bladder control problems.   You have unusual weakness or numbness in your arms or legs.   You develop nausea or vomiting.   You develop abdominal pain.   You feel faint.  Document Released: 06/27/2005 Document Revised: 03/09/2011 Document Reviewed: 11/15/2010 [employer] Patient Information 2012 [employer].Hypertension As your heart beats, it forces blood through your arteries. This  force is your blood pressure. If the pressure is too high, it is called hypertension (HTN) or high blood pressure. HTN is dangerous because you may have it and not know it. High blood pressure may mean that your heart has to work harder to pump blood. Your arteries may be narrow or stiff. The extra work puts you at risk for heart disease, stroke, and other problems.  Blood pressure consists of two numbers, a higher number over a lower, 110/72, for example. It is stated as "110 over 72." The ideal is below 120 for the top number (systolic) and under 80 for the bottom (diastolic). Write down your blood pressure today. You should pay close attention to your blood pressure if you have certain conditions such as:  Heart failure.   Prior heart attack.   Diabetes   Chronic kidney disease.   Prior stroke.   Multiple risk factors for heart disease.  To see if you have HTN, your blood pressure should be measured while you are seated with your arm held at the level of the heart. It should be measured at least twice. A one-time elevated blood pressure reading (especially in the Emergency Department) does not mean that you need treatment. There may be conditions in which the blood pressure is different between your right and left arms. It is important to see your caregiver soon for a recheck. Most people have essential hypertension which means that there is not a specific cause. This type of high blood pressure may be lowered by changing lifestyle factors such as:  Stress.   Smoking.   Lack of exercise.   Excessive weight.   Drug/tobacco/alcohol use.   Eating less salt.  Most people do not have symptoms from high blood pressure until it has caused damage to the body. Effective treatment can often prevent, delay or reduce that damage. TREATMENT  When a cause has been identified, treatment for high blood pressure is directed at the cause. There are a large number of medications to treat HTN. These  fall into several categories, and your caregiver will help you select the medicines that are best for you. Medications may have side effects. You should review side effects with your caregiver. If your blood pressure stays high after you have made lifestyle changes or started on medicines,   Your medication(s) may need to be changed.   Other problems may need to be addressed.   Be certain you understand your prescriptions, and know how and when to take your medicine.   Be sure to follow up with your caregiver within the time frame advised (usually within two weeks) to have your blood pressure rechecked and to review your medications.   If you are taking more than one medicine to lower your blood pressure, make sure you know how and at what times they should be taken. Taking   two medicines at the same time can result in blood pressure that is too low.  SEEK IMMEDIATE MEDICAL CARE IF:  You develop a severe headache, blurred or changing vision, or confusion.   You have unusual weakness or numbness, or a faint feeling.   You have severe chest or abdominal pain, vomiting, or breathing problems.  MAKE SURE YOU:   Understand these instructions.   Will watch your condition.   Will get help right away if you are not doing well or get worse.  Document Released: 06/27/2005 Document Revised: 03/09/2011 Document Reviewed: 02/15/2008 [employer] Patient Information 2012 [employer]. 

## 2011-08-12 NOTE — Assessment & Plan Note (Addendum)
I will check an xray to look for DJD, AVN, etc

## 2011-08-12 NOTE — Assessment & Plan Note (Signed)
Start benicar 

## 2011-08-14 ENCOUNTER — Encounter: Payer: Self-pay | Admitting: Internal Medicine

## 2013-01-18 ENCOUNTER — Encounter: Payer: Self-pay | Admitting: Internal Medicine

## 2013-01-18 ENCOUNTER — Ambulatory Visit (INDEPENDENT_AMBULATORY_CARE_PROVIDER_SITE_OTHER)
Admission: RE | Admit: 2013-01-18 | Discharge: 2013-01-18 | Disposition: A | Discharge status: Home / Self Care | Payer: BC Managed Care – PPO | Source: Ambulatory Visit | Attending: Internal Medicine | Admitting: Internal Medicine

## 2013-01-18 ENCOUNTER — Other Ambulatory Visit (INDEPENDENT_AMBULATORY_CARE_PROVIDER_SITE_OTHER): Payer: BC Managed Care – PPO

## 2013-01-18 ENCOUNTER — Ambulatory Visit (INDEPENDENT_AMBULATORY_CARE_PROVIDER_SITE_OTHER): Payer: BC Managed Care – PPO | Admitting: Internal Medicine

## 2013-01-18 VITALS — BP 130/82 | HR 64 | Temp 98.6°F | Resp 16 | Wt 169.2 lb

## 2013-01-18 DIAGNOSIS — Z23 Encounter for immunization: Secondary | ICD-10-CM

## 2013-01-18 DIAGNOSIS — Z0000 Encounter for general adult medical examination without abnormal findings: Secondary | ICD-10-CM

## 2013-01-18 DIAGNOSIS — I10 Essential (primary) hypertension: Secondary | ICD-10-CM

## 2013-01-18 DIAGNOSIS — M545 Low back pain, unspecified: Secondary | ICD-10-CM

## 2013-01-18 DIAGNOSIS — Z2911 Encounter for prophylactic immunotherapy for respiratory syncytial virus (RSV): Secondary | ICD-10-CM

## 2013-01-18 DIAGNOSIS — M25559 Pain in unspecified hip: Secondary | ICD-10-CM

## 2013-01-18 DIAGNOSIS — M25552 Pain in left hip: Secondary | ICD-10-CM

## 2013-01-18 LAB — COMPREHENSIVE METABOLIC PANEL
ALT: 19 U/L (ref 0–53)
AST: 25 U/L (ref 0–37)
Albumin: 4.6 g/dL (ref 3.5–5.2)
Calcium: 9.7 mg/dL (ref 8.4–10.5)
Chloride: 103 mEq/L (ref 96–112)
Creatinine, Ser: 0.9 mg/dL (ref 0.4–1.5)
Potassium: 4.5 mEq/L (ref 3.5–5.1)
Sodium: 139 mEq/L (ref 135–145)

## 2013-01-18 LAB — CBC WITH DIFFERENTIAL/PLATELET
Basophils Absolute: 0 10*3/uL (ref 0.0–0.1)
Eosinophils Absolute: 0.3 10*3/uL (ref 0.0–0.7)
Hemoglobin: 16.6 g/dL (ref 13.0–17.0)
Lymphocytes Relative: 23.9 % (ref 12.0–46.0)
MCHC: 34.3 g/dL (ref 30.0–36.0)
Neutro Abs: 4.1 10*3/uL (ref 1.4–7.7)
RDW: 13 % (ref 11.5–14.6)

## 2013-01-18 LAB — URINALYSIS, ROUTINE W REFLEX MICROSCOPIC
Bilirubin Urine: NEGATIVE
Ketones, ur: NEGATIVE
Leukocytes, UA: NEGATIVE
Specific Gravity, Urine: 1.015 (ref 1.000–1.030)
Urobilinogen, UA: 0.2 (ref 0.0–1.0)

## 2013-01-18 LAB — LIPID PANEL
LDL Cholesterol: 85 mg/dL (ref 0–99)
Total CHOL/HDL Ratio: 2

## 2013-01-18 MED ORDER — TRAMADOL HCL 50 MG PO TABS: 50.0000 mg | ORAL_TABLET | Freq: Three times a day (TID) | ORAL | Status: DC | PRN

## 2013-01-18 NOTE — Patient Instructions (Signed)
Hip Bursitis Bursitis is a swelling and soreness (inflammation) of a fluid-filled sac (bursa). This sac overlies and protects the joints.  CAUSES   Injury.  Overuse of the muscles surrounding the joint.  Arthritis.  Gout.  Infection.  Cold weather.  Inadequate warm-up and conditioning prior to activities. The cause may not be known.  SYMPTOMS   Mild to severe irritation.  Tenderness and swelling over the outside of the hip.  Pain with motion of the hip.  If the bursa becomes infected, a fever may be present. Redness, tenderness, and warmth will develop over the hip. Symptoms usually lessen in 3 to 4 weeks with treatment, but can come back. TREATMENT If conservative treatment does not work, your caregiver may advise draining the bursa and injecting cortisone into the area. This may speed up the healing process. This may also be used as an initial treatment of choice. HOME CARE INSTRUCTIONS   Apply ice to the affected area for 15-20 minutes every 3 to 4 hours while awake for the first 2 days. Put the ice in a plastic bag and place a towel between the bag of ice and your skin.  Rest the painful joint as much as possible, but continue to put the joint through a normal range of motion at least 4 times per day. When the pain lessens, begin normal, slow movements and usual activities to help prevent stiffness of the hip.  Only take over-the-counter or prescription medicines for pain, discomfort, or fever as directed by your caregiver.  Use crutches to limit weight bearing on the hip joint, if advised.  Elevate your painful hip to reduce swelling. Use pillows for propping and cushioning your legs and hips.  Gentle massage may provide comfort and decrease swelling. SEEK IMMEDIATE MEDICAL CARE IF:   Your pain increases even during treatment, or you are not improving.  You have a fever.  You have heat and inflammation over the involved bursa.  You have any other questions or  concerns. MAKE SURE YOU:   Understand these instructions.  Will watch your condition.  Will get help right away if you are not doing well or get worse. Document Released: 12/17/2001 Document Revised: 09/19/2011 Document Reviewed: 07/16/2008 [facility] Patient Information 2014 [place], [place]. Health Maintenance, Males A healthy lifestyle and preventative care can promote health and wellness.  Maintain regular health, dental, and eye exams.  Eat a healthy diet. Foods like vegetables, fruits, whole grains, low-fat dairy products, and lean protein foods contain the nutrients you need without too many calories. Decrease your intake of foods high in solid fats, added sugars, and salt. Get information about a proper diet from your caregiver, if necessary.  Regular physical exercise is one of the most important things you can do for your health. Most adults should get at least 150 minutes of moderate-intensity exercise (any activity that increases your heart rate and causes you to sweat) each week. In addition, most adults need muscle-strengthening exercises on 2 or more days a week.   Maintain a healthy weight. The body mass index (BMI) is a screening tool to identify possible weight problems. It provides an estimate of body fat based on height and weight. Your caregiver can help determine your BMI, and can help you achieve or maintain a healthy weight. For adults 20 years and older:  A BMI below 18.5 is considered underweight.  A BMI of 18.5 to 24.9 is normal.  A BMI of 25 to 29.9 is considered overweight.  A BMI  of 30 and above is considered obese.  Maintain normal blood lipids and cholesterol by exercising and minimizing your intake of saturated fat. Eat a balanced diet with plenty of fruits and vegetables. Blood tests for lipids and cholesterol should begin at age 58 and be repeated every 5 years. If your lipid or cholesterol levels are high, you are over 50, or you are a high risk for  heart disease, you may need your cholesterol levels checked more frequently.Ongoing high lipid and cholesterol levels should be treated with medicines, if diet and exercise are not effective.  If you smoke, find out from your caregiver how to quit. If you do not use tobacco, do not start.  If you choose to drink alcohol, do not exceed 2 drinks per day. One drink is considered to be 12 ounces (355 mL) of beer, 5 ounces (148 mL) of wine, or 1.5 ounces (44 mL) of liquor.  Avoid use of street drugs. Do not share needles with anyone. Ask for help if you need support or instructions about stopping the use of drugs.  High blood pressure causes heart disease and increases the risk of stroke. Blood pressure should be checked at least every 1 to 2 years. Ongoing high blood pressure should be treated with medicines if weight loss and exercise are not effective.  If you are 55 to 63 years old, ask your caregiver if you should take aspirin to prevent heart disease.  Diabetes screening involves taking a blood sample to check your fasting blood sugar level. This should be done once every 3 years, after age 41, if you are within normal weight and without risk factors for diabetes. Testing should be considered at a younger age or be carried out more frequently if you are overweight and have at least 1 risk factor for diabetes.  Colorectal cancer can be detected and often prevented. Most routine colorectal cancer screening begins at the age of 13 and continues through age 69. However, your caregiver may recommend screening at an earlier age if you have risk factors for colon cancer. On a yearly basis, your caregiver may provide home test kits to check for hidden blood in the stool. Use of a small camera at the end of a tube, to directly examine the colon (sigmoidoscopy or colonoscopy), can detect the earliest forms of colorectal cancer. Talk to your caregiver about this at age 57, when routine screening begins. Direct  examination of the colon should be repeated every 5 to 10 years through age 18, unless early forms of pre-cancerous polyps or small growths are found.  Hepatitis C blood testing is recommended for all people born from [birth year] through 1966 and any individual with known risks for hepatitis C.  Healthy men should no longer receive prostate-specific antigen (PSA) blood tests as part of routine cancer screening. Consult with your caregiver about prostate cancer screening.  Testicular cancer screening is not recommended for adolescents or adult males who have no symptoms. Screening includes self-exam, caregiver exam, and other screening tests. Consult with your caregiver about any symptoms you have or any concerns you have about testicular cancer.  Practice safe sex. Use condoms and avoid high-risk sexual practices to reduce the spread of sexually transmitted infections (STIs).  Use sunscreen with a sun protection factor (SPF) of 30 or greater. Apply sunscreen liberally and repeatedly throughout the day. You should seek shade when your shadow is shorter than you. Protect yourself by wearing long sleeves, pants, a wide-brimmed hat, and sunglasses year  round, whenever you are outdoors.  Notify your caregiver of new moles or changes in moles, especially if there is a change in shape or color. Also notify your caregiver if a mole is larger than the size of a pencil eraser.  A one-time screening for abdominal aortic aneurysm (AAA) and surgical repair of large AAAs by sound wave imaging (ultrasonography) is recommended for ages 102 to 82 years who are current or former smokers.  Stay current with your immunizations. Document Released: 12/24/2007 Document Revised: 09/19/2011 Document Reviewed: 11/22/2010 [facility] Patient Information 2014 [place], [place].

## 2013-01-18 NOTE — Assessment & Plan Note (Signed)
Plain film is + for DDD He has no s/s of radicular pathology Will continue nsaids and will try tramadol

## 2013-01-18 NOTE — Progress Notes (Signed)
Subjective:    Patient ID: Mark Cohen, male    DOB: [birth year], 62 y.o.   MRN: [id number]  Arthritis Presents for follow-up visit. He complains of pain. He reports no stiffness, joint swelling or joint warmth. The symptoms have been worsening. Affected locations include the left hip. His pain is at a severity of 3/10. Associated symptoms include pain at night and pain while resting. Pertinent negatives include no diarrhea, dry eyes, dry mouth, dysuria, fatigue, fever, rash, Raynaud's syndrome, uveitis or weight loss.      Review of Systems  Constitutional: Negative.  Negative for fever, chills, weight loss, diaphoresis, activity change, appetite change, fatigue and unexpected weight change.  HENT: Negative.   Eyes: Negative.   Respiratory: Negative.  Negative for cough, chest tightness, shortness of breath, wheezing and stridor.   Cardiovascular: Negative.  Negative for chest pain, palpitations and leg swelling.  Gastrointestinal: Negative.  Negative for nausea, vomiting, abdominal pain, diarrhea, constipation and blood in stool.  Endocrine: Negative.   Genitourinary: Negative.  Negative for dysuria, urgency, frequency, hematuria, flank pain, decreased urine volume, discharge, penile swelling, scrotal swelling, enuresis, difficulty urinating, genital sores, penile pain and testicular pain.  Musculoskeletal: Positive for back pain, arthralgias and arthritis. Negative for myalgias, joint swelling, gait problem and stiffness.  Skin: Negative.  Negative for color change, pallor, rash and wound.  Allergic/Immunologic: Negative.   Neurological: Negative.  Negative for dizziness.  Hematological: Negative.  Negative for adenopathy. Does not bruise/bleed easily.  Psychiatric/Behavioral: Negative.        Objective:   Physical Exam  Vitals reviewed. Constitutional: He is oriented to person, place, and time. He appears well-developed and well-nourished. No distress.  HENT:  Head:  Normocephalic and atraumatic.  Mouth/Throat: Oropharynx is clear and moist. No oropharyngeal exudate.  Eyes: Conjunctivae are normal. Right eye exhibits no discharge. Left eye exhibits no discharge. No scleral icterus.  Neck: Normal range of motion. Neck supple. No JVD present. No tracheal deviation present. No thyromegaly present.  Cardiovascular: Normal rate, regular rhythm, normal heart sounds and intact distal pulses.  Exam reveals no gallop and no friction rub.   No murmur heard. Pulmonary/Chest: Effort normal and breath sounds normal. No stridor. No respiratory distress. He has no wheezes. He has no rales. He exhibits no tenderness.  Abdominal: Soft. Bowel sounds are normal. He exhibits no distension and no mass. There is no tenderness. There is no rebound and no guarding. Hernia confirmed negative in the right inguinal area and confirmed negative in the left inguinal area.  Genitourinary: Rectum normal, prostate normal, testes normal and penis normal. Rectal exam shows no external hemorrhoid, no internal hemorrhoid, no fissure, no mass, no tenderness and anal tone normal. Guaiac negative stool. Prostate is not enlarged and not tender. Right testis shows no mass, no swelling and no tenderness. Right testis is descended. Left testis shows no mass, no swelling and no tenderness. Left testis is descended. Circumcised. No penile erythema or penile tenderness. No discharge found.  Left hemiscrotum is empty  Musculoskeletal: Normal range of motion. He exhibits no edema and no tenderness.       Left hip: He exhibits tenderness (over the GT). He exhibits normal range of motion, normal strength, no bony tenderness, no swelling, no crepitus, no deformity and no laceration.       Lumbar back: Normal. He exhibits normal range of motion, no tenderness, no bony tenderness, no swelling, no edema, no deformity, no laceration, no pain, no spasm and normal pulse.  Lymphadenopathy:    He has no cervical adenopathy.        Right: No inguinal adenopathy present.       Left: No inguinal adenopathy present.  Neurological: He is alert and oriented to person, place, and time. He has normal reflexes. He displays normal reflexes. No cranial nerve deficit. He exhibits normal muscle tone. Coordination normal.  Skin: Skin is warm and dry. No rash noted. He is not diaphoretic. No erythema. No pallor.  Psychiatric: He has a normal mood and affect. His behavior is normal. Judgment and thought content normal.      Lab Results  Component Value Date   WBC 6.1 04/28/2011   HGB 16.0 04/28/2011   HCT 47.7 04/28/2011   PLT 217.0 04/28/2011   GLUCOSE 101* 08/12/2011   CHOL 165 04/28/2011   TRIG 28.0 04/28/2011   HDL 78.40 04/28/2011   LDLCALC 81 04/28/2011   ALT 23 04/28/2011   AST 27 04/28/2011   NA 137 08/12/2011   K 3.9 08/12/2011   CL 103 08/12/2011   CREATININE 0.8 08/12/2011   BUN 13 08/12/2011   CO2 27 08/12/2011   TSH 1.29 04/28/2011   PSA 0.40 04/28/2011   HGBA1C 5.2 08/12/2011      Assessment & Plan:

## 2013-01-18 NOTE — Assessment & Plan Note (Signed)
Exam done Labs ordered Vaccines were reviewed and updated Pt ed material was given

## 2013-01-18 NOTE — Assessment & Plan Note (Signed)
He appears to have trochanteric bursitis Will try tramadol in addition to the nsaids He does not want to see ortho

## 2013-01-18 NOTE — Assessment & Plan Note (Signed)
His BP is well controlled 

## 2013-01-23 ENCOUNTER — Other Ambulatory Visit: Payer: Self-pay | Admitting: Physician Assistant

## 2013-02-22 ENCOUNTER — Ambulatory Visit (INDEPENDENT_AMBULATORY_CARE_PROVIDER_SITE_OTHER): Payer: BC Managed Care – PPO | Admitting: Internal Medicine

## 2013-02-22 ENCOUNTER — Encounter: Payer: Self-pay | Admitting: Internal Medicine

## 2013-02-22 VITALS — BP 138/86 | HR 68 | Temp 97.9°F | Resp 16 | Wt 168.0 lb

## 2013-02-22 DIAGNOSIS — I10 Essential (primary) hypertension: Secondary | ICD-10-CM

## 2013-02-22 DIAGNOSIS — IMO0002 Reserved for concepts with insufficient information to code with codable children: Secondary | ICD-10-CM

## 2013-02-22 DIAGNOSIS — M5416 Radiculopathy, lumbar region: Secondary | ICD-10-CM

## 2013-02-22 MED ORDER — CELECOXIB 200 MG PO CAPS: 200.0000 mg | ORAL_CAPSULE | Freq: Every day | ORAL | Status: DC

## 2013-02-22 MED ORDER — HYDROCODONE-ACETAMINOPHEN 5-325 MG PO TABS: 1.0000 | ORAL_TABLET | Freq: Four times a day (QID) | ORAL | Status: DC | PRN

## 2013-02-22 NOTE — Patient Instructions (Signed)
Back Pain, Adult  Low back pain is very common. About 1 in 5 people have back pain. The cause of low back pain is rarely dangerous. The pain often gets better over time. About half of people with a sudden onset of back pain feel better in just 2 weeks. About 8 in 10 people feel better by 6 weeks.   CAUSES  Some common causes of back pain include:  · Strain of the muscles or ligaments supporting the spine.  · Wear and tear (degeneration) of the spinal discs.  · Arthritis.  · Direct injury to the back.  DIAGNOSIS  Most of the time, the direct cause of low back pain is not known. However, back pain can be treated effectively even when the exact cause of the pain is unknown. Answering your caregiver's questions about your overall health and symptoms is one of the most accurate ways to make sure the cause of your pain is not dangerous. If your caregiver needs more information, he or she may order lab work or imaging tests (X-rays or MRIs). However, even if imaging tests show changes in your back, this usually does not require surgery.  HOME CARE INSTRUCTIONS  For many people, back pain returns. Since low back pain is rarely dangerous, it is often a condition that people can learn to manage on their own.   · Remain active. It is stressful on the back to sit or stand in one place. Do not sit, drive, or stand in one place for more than 30 minutes at a time. Take short walks on level surfaces as soon as pain allows. Try to increase the length of time you walk each day.  · Do not stay in bed. Resting more than 1 or 2 days can delay your recovery.  · Do not avoid exercise or work. Your body is made to move. It is not dangerous to be active, even though your back may hurt. Your back will likely heal faster if you return to being active before your pain is gone.  · Pay attention to your body when you  bend and lift. Many people have less discomfort when lifting if they bend their knees, keep the load close to their bodies, and  avoid twisting. Often, the most comfortable positions are those that put less stress on your recovering back.  · Find a comfortable position to sleep. Use a firm mattress and lie on your side with your knees slightly bent. If you lie on your back, put a pillow under your knees.  · Only take over-the-counter or prescription medicines as directed by your caregiver. Over-the-counter medicines to reduce pain and inflammation are often the most helpful. Your caregiver may prescribe muscle relaxant drugs. These medicines help dull your pain so you can more quickly return to your normal activities and healthy exercise.  · Put ice on the injured area.  · Put ice in a plastic bag.  · Place a towel between your skin and the bag.  · Leave the ice on for 15-20 minutes, 3-4 times a day for the first 2 to 3 days. After that, ice and heat may be alternated to reduce pain and spasms.  · Ask your caregiver about trying back exercises and gentle massage. This may be of some benefit.  · Avoid feeling anxious or stressed. Stress increases muscle tension and can worsen back pain. It is important to recognize when you are anxious or stressed and learn ways to manage it. Exercise is a great option.  SEEK MEDICAL CARE IF:  · You have pain that is not relieved with rest or   medicine.  · You have pain that does not improve in 1 week.  · You have new symptoms.  · You are generally not feeling well.  SEEK IMMEDIATE MEDICAL CARE IF:   · You have pain that radiates from your back into your legs.  · You develop new bowel or bladder control problems.  · You have unusual weakness or numbness in your arms or legs.  · You develop nausea or vomiting.  · You develop abdominal pain.  · You feel faint.  Document Released: 06/27/2005 Document Revised: 12/27/2011 Document Reviewed: 11/15/2010  [employer]® Patient Information ©2014 [employer].

## 2013-02-22 NOTE — Progress Notes (Signed)
Subjective:    Patient ID: Mark Cohen, male    DOB: 1951-06-19, 62 y.o.   MRN: [id number]  Back Pain This is a recurrent problem. The current episode started more than 1 month ago. The problem occurs constantly. The problem has been gradually worsening since onset. The pain is present in the lumbar spine. The quality of the pain is described as aching and shooting. The pain radiates to the left thigh. The pain is at a severity of 4/10. The pain is moderate. The pain is worse during the day. The symptoms are aggravated by position, standing and bending. Associated symptoms include leg pain and numbness (in his left thigh). Pertinent negatives include no abdominal pain, bladder incontinence, bowel incontinence, chest pain, dysuria, fever, headaches, paresis, paresthesias, pelvic pain, perianal numbness, tingling, weakness or weight loss. He has tried NSAIDs and analgesics for the symptoms. The treatment provided mild relief.      Review of Systems  Constitutional: Negative.  Negative for fever, chills, weight loss, diaphoresis, appetite change and fatigue.  HENT: Negative.   Eyes: Negative.   Respiratory: Negative.  Negative for cough, chest tightness, shortness of breath, wheezing and stridor.   Cardiovascular: Negative.  Negative for chest pain, palpitations and leg swelling.  Gastrointestinal: Negative.  Negative for nausea, vomiting, abdominal pain, diarrhea, constipation and bowel incontinence.  Endocrine: Negative.   Genitourinary: Negative.  Negative for bladder incontinence, dysuria and pelvic pain.  Musculoskeletal: Positive for back pain. Negative for myalgias, joint swelling, arthralgias and gait problem.  Skin: Negative.   Allergic/Immunologic: Negative.   Neurological: Positive for numbness (in his left thigh). Negative for dizziness, tingling, tremors, weakness, headaches and paresthesias.  Hematological: Negative.  Negative for adenopathy. Does not bruise/bleed easily.   Psychiatric/Behavioral: Negative.        Objective:   Physical Exam  Vitals reviewed. Constitutional: He is oriented to person, place, and time. He appears well-developed and well-nourished. No distress.  HENT:  Head: Normocephalic and atraumatic.  Mouth/Throat: Oropharynx is clear and moist. No oropharyngeal exudate.  Eyes: Conjunctivae are normal. Right eye exhibits no discharge. Left eye exhibits no discharge. No scleral icterus.  Neck: Normal range of motion. Neck supple. No JVD present. No tracheal deviation present. No thyromegaly present.  Cardiovascular: Normal rate, regular rhythm, normal heart sounds and intact distal pulses.  Exam reveals no gallop and no friction rub.   No murmur heard. Pulmonary/Chest: Effort normal and breath sounds normal. No stridor. No respiratory distress. He has no wheezes. He has no rales. He exhibits no tenderness.  Abdominal: Soft. Bowel sounds are normal. He exhibits no distension and no mass. There is no tenderness. There is no rebound and no guarding.  Musculoskeletal: Normal range of motion. He exhibits no edema and no tenderness.       Lumbar back: Normal. He exhibits normal range of motion, no tenderness, no bony tenderness, no swelling, no edema, no deformity, no laceration, no pain, no spasm and normal pulse.  Lymphadenopathy:    He has no cervical adenopathy.  Neurological: He is alert and oriented to person, place, and time. He has normal strength. He displays no atrophy, no tremor and normal reflexes. No cranial nerve deficit or sensory deficit. He exhibits normal muscle tone. He displays no seizure activity. Coordination and gait normal. He displays no Babinski's sign on the right side. He displays no Babinski's sign on the left side.  Reflex Scores:      Tricep reflexes are 1+ on the right side and  1+ on the left side.      Bicep reflexes are 1+ on the right side and 1+ on the left side.      Brachioradialis reflexes are 1+ on the right  side and 1+ on the left side.      Patellar reflexes are 1+ on the right side and 1+ on the left side.      Achilles reflexes are 1+ on the right side and 1+ on the left side. Neg SLR in BLE  Skin: Skin is warm and dry. No rash noted. He is not diaphoretic. No erythema. No pallor.  Psychiatric: He has a normal mood and affect. His behavior is normal. Judgment and thought content normal.     Lab Results  Component Value Date   WBC 6.3 01/18/2013   HGB 16.6 01/18/2013   HCT 48.6 01/18/2013   PLT 204.0 01/18/2013   GLUCOSE 100* 01/18/2013   CHOL 173 01/18/2013   TRIG 35.0 01/18/2013   HDL 80.90 01/18/2013   LDLCALC 85 01/18/2013   ALT 19 01/18/2013   AST 25 01/18/2013   NA 139 01/18/2013   K 4.5 01/18/2013   CL 103 01/18/2013   CREATININE 0.9 01/18/2013   BUN 15 01/18/2013   CO2 30 01/18/2013   TSH 1.66 01/18/2013   PSA 0.40 04/28/2011   HGBA1C 5.2 08/12/2011       Assessment & Plan:

## 2013-02-22 NOTE — Assessment & Plan Note (Signed)
His BP is adequately well controlled 

## 2013-02-22 NOTE — Assessment & Plan Note (Signed)
He was taking aleve for pain but that interferes with his asa therapy so I have asked him to change to celebrex Also, tramadol has not help much with his pain so I have changed to norco He has DDD on his plain film and has radicular s/s so I have ordered an MRI to if she he has spinal stenosis, nerve impingement, HNP

## 2013-02-23 ENCOUNTER — Other Ambulatory Visit: Payer: BC Managed Care – PPO

## 2013-03-07 ENCOUNTER — Encounter: Payer: Self-pay | Admitting: Internal Medicine

## 2013-03-07 ENCOUNTER — Ambulatory Visit
Admission: RE | Admit: 2013-03-07 | Discharge: 2013-03-07 | Disposition: A | Discharge status: Home / Self Care | Payer: BC Managed Care – PPO | Source: Ambulatory Visit | Attending: Internal Medicine | Admitting: Internal Medicine

## 2013-03-07 ENCOUNTER — Other Ambulatory Visit: Payer: Self-pay | Admitting: Internal Medicine

## 2013-03-07 DIAGNOSIS — M5416 Radiculopathy, lumbar region: Secondary | ICD-10-CM

## 2013-03-21 ENCOUNTER — Other Ambulatory Visit: Payer: Self-pay | Admitting: Neurosurgery

## 2013-03-21 DIAGNOSIS — M47817 Spondylosis without myelopathy or radiculopathy, lumbosacral region: Secondary | ICD-10-CM

## 2013-04-02 ENCOUNTER — Ambulatory Visit
Admission: RE | Admit: 2013-04-02 | Discharge: 2013-04-02 | Disposition: A | Discharge status: Home / Self Care | Payer: BC Managed Care – PPO | Source: Ambulatory Visit | Attending: Neurosurgery | Admitting: Neurosurgery

## 2013-04-02 DIAGNOSIS — M47817 Spondylosis without myelopathy or radiculopathy, lumbosacral region: Secondary | ICD-10-CM

## 2013-04-02 MED ORDER — GADOBENATE DIMEGLUMINE 529 MG/ML IV SOLN
15.0000 mL | Freq: Once | INTRAVENOUS | Status: AC | PRN
  Administered 2013-04-02: 15 mL via INTRAVENOUS

## 2013-05-07 ENCOUNTER — Telehealth: Payer: Self-pay

## 2013-05-07 MED ORDER — ALPRAZOLAM 0.25 MG PO TABS: 0.2500 mg | ORAL_TABLET | Freq: Two times a day (BID) | ORAL | Status: DC | PRN

## 2013-05-07 NOTE — Telephone Encounter (Signed)
Pt called stating that wife has be diagnosed with cancer which has caused increased stress and sleeping difficulty. He would like to know if MD was perscribed xanax or something related to help him during this time.

## 2013-05-10 ENCOUNTER — Ambulatory Visit (INDEPENDENT_AMBULATORY_CARE_PROVIDER_SITE_OTHER): Payer: BC Managed Care – PPO | Admitting: Internal Medicine

## 2013-05-10 ENCOUNTER — Encounter: Payer: Self-pay | Admitting: Internal Medicine

## 2013-05-10 ENCOUNTER — Ambulatory Visit (INDEPENDENT_AMBULATORY_CARE_PROVIDER_SITE_OTHER)
Admission: RE | Admit: 2013-05-10 | Discharge: 2013-05-10 | Disposition: A | Discharge status: Home / Self Care | Payer: BC Managed Care – PPO | Source: Ambulatory Visit | Attending: Internal Medicine | Admitting: Internal Medicine

## 2013-05-10 VITALS — BP 122/86 | HR 71 | Temp 98.1°F | Resp 16 | Ht 72.0 in | Wt 169.8 lb

## 2013-05-10 DIAGNOSIS — M25552 Pain in left hip: Secondary | ICD-10-CM

## 2013-05-10 DIAGNOSIS — H9190 Unspecified hearing loss, unspecified ear: Secondary | ICD-10-CM | POA: Insufficient documentation

## 2013-05-10 DIAGNOSIS — M25559 Pain in unspecified hip: Secondary | ICD-10-CM

## 2013-05-10 DIAGNOSIS — M5416 Radiculopathy, lumbar region: Secondary | ICD-10-CM

## 2013-05-10 DIAGNOSIS — IMO0002 Reserved for concepts with insufficient information to code with codable children: Secondary | ICD-10-CM

## 2013-05-10 DIAGNOSIS — M159 Polyosteoarthritis, unspecified: Secondary | ICD-10-CM | POA: Insufficient documentation

## 2013-05-10 MED ORDER — CELECOXIB 200 MG PO CAPS: 200.0000 mg | ORAL_CAPSULE | Freq: Every day | ORAL | Status: DC

## 2013-05-10 MED ORDER — HYDROCODONE-ACETAMINOPHEN 5-325 MG PO TABS: 1.0000 | ORAL_TABLET | Freq: Four times a day (QID) | ORAL | Status: DC | PRN

## 2013-05-10 NOTE — Progress Notes (Signed)
Subjective:    Patient ID: Mark Cohen, male    DOB: 28-Jan-1951, 62 y.o.   MRN: [id number]  Back Pain This is a chronic problem. The current episode started more than 1 year ago. The problem occurs intermittently. The problem is unchanged. The pain is present in the lumbar spine. The quality of the pain is described as aching. The pain radiates to the left thigh. The pain is at a severity of 3/10. The pain is mild. The pain is worse during the day. The symptoms are aggravated by bending, position and standing. Associated symptoms include leg pain (left thigh pain). Pertinent negatives include no abdominal pain, bladder incontinence, bowel incontinence, chest pain, dysuria, fever, headaches, numbness, paresis, paresthesias, pelvic pain, perianal numbness, tingling, weakness or weight loss. He has tried analgesics and NSAIDs for the symptoms. The treatment provided moderate relief.      Review of Systems  Constitutional: Negative.  Negative for fever, chills, weight loss, diaphoresis, appetite change and fatigue.  HENT: Negative.   Eyes: Negative.   Respiratory: Negative.  Negative for cough, chest tightness, shortness of breath, wheezing and stridor.   Cardiovascular: Negative.  Negative for chest pain, palpitations and leg swelling.  Gastrointestinal: Negative.  Negative for nausea, vomiting, abdominal pain, diarrhea, constipation and bowel incontinence.  Endocrine: Negative.   Genitourinary: Negative.  Negative for bladder incontinence, dysuria and pelvic pain.  Musculoskeletal: Positive for back pain. Negative for arthralgias, gait problem, joint swelling, myalgias, neck pain and neck stiffness.  Skin: Negative.   Allergic/Immunologic: Negative.   Neurological: Negative.  Negative for dizziness, tingling, tremors, weakness, light-headedness, numbness, headaches and paresthesias.  Hematological: Negative.  Negative for adenopathy. Does not bruise/bleed easily.  Psychiatric/Behavioral:  Negative for suicidal ideas, hallucinations, behavioral problems, confusion, sleep disturbance, self-injury, dysphoric mood, decreased concentration and agitation. The patient is nervous/anxious. The patient is not hyperactive.        Objective:   Physical Exam  Vitals reviewed. Constitutional: He is oriented to person, place, and time. He appears well-developed and well-nourished. No distress.  HENT:  Head: Normocephalic and atraumatic.  Mouth/Throat: Oropharynx is clear and moist. No oropharyngeal exudate.  Eyes: Conjunctivae are normal. Right eye exhibits no discharge. Left eye exhibits no discharge. No scleral icterus.  Neck: Normal range of motion. Neck supple. No JVD present. No tracheal deviation present. No thyromegaly present.  Cardiovascular: Normal rate, regular rhythm, normal heart sounds and intact distal pulses.  Exam reveals no gallop and no friction rub.   No murmur heard. Pulmonary/Chest: Effort normal and breath sounds normal. No stridor. No respiratory distress. He has no wheezes. He has no rales. He exhibits no tenderness.  Abdominal: Soft. Bowel sounds are normal. He exhibits no distension and no mass. There is no tenderness. There is no rebound and no guarding.  Musculoskeletal: Normal range of motion. He exhibits no edema and no tenderness.       Left hip: Normal.       Left knee: Normal.       Lumbar back: Normal.       Left upper leg: Normal. He exhibits no tenderness, no bony tenderness, no swelling, no edema, no deformity and no laceration.  Lymphadenopathy:    He has no cervical adenopathy.  Neurological: He is alert and oriented to person, place, and time. He has normal strength. He displays no atrophy, no tremor and normal reflexes. No cranial nerve deficit or sensory deficit. He exhibits normal muscle tone. He displays a negative Romberg sign. He displays  no seizure activity. Coordination and gait normal. He displays no Babinski's sign on the right side. He  displays no Babinski's sign on the left side.  Reflex Scores:      Tricep reflexes are 1+ on the right side and 1+ on the left side.      Bicep reflexes are 1+ on the right side and 1+ on the left side.      Brachioradialis reflexes are 1+ on the right side and 1+ on the left side.      Patellar reflexes are 1+ on the right side and 1+ on the left side.      Achilles reflexes are 1+ on the right side and 1+ on the left side. Neg SLR in BLE  Skin: Skin is warm and dry. No rash noted. He is not diaphoretic. No erythema. No pallor.  Psychiatric: He has a normal mood and affect. His behavior is normal. Judgment and thought content normal.     Lab Results  Component Value Date   WBC 6.3 01/18/2013   HGB 16.6 01/18/2013   HCT 48.6 01/18/2013   PLT 204.0 01/18/2013   GLUCOSE 100* 01/18/2013   CHOL 173 01/18/2013   TRIG 35.0 01/18/2013   HDL 80.90 01/18/2013   LDLCALC 85 01/18/2013   ALT 19 01/18/2013   AST 25 01/18/2013   NA 139 01/18/2013   K 4.5 01/18/2013   CL 103 01/18/2013   CREATININE 0.9 01/18/2013   BUN 15 01/18/2013   CO2 30 01/18/2013   TSH 1.66 01/18/2013   PSA 0.40 04/28/2011   HGBA1C 5.2 08/12/2011       Assessment & Plan:

## 2013-05-10 NOTE — Assessment & Plan Note (Signed)
He will cont celebrex and norco as needed for pain

## 2013-05-10 NOTE — Patient Instructions (Signed)
Back Pain, Adult  Low back pain is very common. About 1 in 5 people have back pain. The cause of low back pain is rarely dangerous. The pain often gets better over time. About half of people with a sudden onset of back pain feel better in just 2 weeks. About 8 in 10 people feel better by 6 weeks.   CAUSES  Some common causes of back pain include:  · Strain of the muscles or ligaments supporting the spine.  · Wear and tear (degeneration) of the spinal discs.  · Arthritis.  · Direct injury to the back.  DIAGNOSIS  Most of the time, the direct cause of low back pain is not known. However, back pain can be treated effectively even when the exact cause of the pain is unknown. Answering your caregiver's questions about your overall health and symptoms is one of the most accurate ways to make sure the cause of your pain is not dangerous. If your caregiver needs more information, he or she may order lab work or imaging tests (X-rays or MRIs). However, even if imaging tests show changes in your back, this usually does not require surgery.  HOME CARE INSTRUCTIONS  For many people, back pain returns. Since low back pain is rarely dangerous, it is often a condition that people can learn to manage on their own.   · Remain active. It is stressful on the back to sit or stand in one place. Do not sit, drive, or stand in one place for more than 30 minutes at a time. Take short walks on level surfaces as soon as pain allows. Try to increase the length of time you walk each day.  · Do not stay in bed. Resting more than 1 or 2 days can delay your recovery.  · Do not avoid exercise or work. Your body is made to move. It is not dangerous to be active, even though your back may hurt. Your back will likely heal faster if you return to being active before your pain is gone.  · Pay attention to your body when you  bend and lift. Many people have less discomfort when lifting if they bend their knees, keep the load close to their bodies, and  avoid twisting. Often, the most comfortable positions are those that put less stress on your recovering back.  · Find a comfortable position to sleep. Use a firm mattress and lie on your side with your knees slightly bent. If you lie on your back, put a pillow under your knees.  · Only take over-the-counter or prescription medicines as directed by your caregiver. Over-the-counter medicines to reduce pain and inflammation are often the most helpful. Your caregiver may prescribe muscle relaxant drugs. These medicines help dull your pain so you can more quickly return to your normal activities and healthy exercise.  · Put ice on the injured area.  · Put ice in a plastic bag.  · Place a towel between your skin and the bag.  · Leave the ice on for 15-20 minutes, 3-4 times a day for the first 2 to 3 days. After that, ice and heat may be alternated to reduce pain and spasms.  · Ask your caregiver about trying back exercises and gentle massage. This may be of some benefit.  · Avoid feeling anxious or stressed. Stress increases muscle tension and can worsen back pain. It is important to recognize when you are anxious or stressed and learn ways to manage it. Exercise is a great option.  SEEK MEDICAL CARE IF:  · You have pain that is not relieved with rest or   medicine.  · You have pain that does not improve in 1 week.  · You have new symptoms.  · You are generally not feeling well.  SEEK IMMEDIATE MEDICAL CARE IF:   · You have pain that radiates from your back into your legs.  · You develop new bowel or bladder control problems.  · You have unusual weakness or numbness in your arms or legs.  · You develop nausea or vomiting.  · You develop abdominal pain.  · You feel faint.  Document Released: 06/27/2005 Document Revised: 12/27/2011 Document Reviewed: 11/15/2010  [employer]® Patient Information ©2014 [employer].

## 2013-05-10 NOTE — Assessment & Plan Note (Signed)
I will check a plain film to see if there is a boney lesion

## 2013-06-23 ENCOUNTER — Other Ambulatory Visit: Payer: Self-pay | Admitting: Internal Medicine

## 2013-10-14 ENCOUNTER — Telehealth: Payer: Self-pay | Admitting: *Deleted

## 2013-10-14 NOTE — Telephone Encounter (Signed)
He needs to be seen

## 2013-10-14 NOTE — Telephone Encounter (Signed)
Pt called requesting Hydrocodone refill.  Please advise 

## 2013-10-14 NOTE — Telephone Encounter (Signed)
Left detailed message on pts VM advising of MDs message

## 2013-11-20 ENCOUNTER — Ambulatory Visit: Payer: BC Managed Care – PPO | Admitting: Internal Medicine

## 2013-11-22 ENCOUNTER — Ambulatory Visit (INDEPENDENT_AMBULATORY_CARE_PROVIDER_SITE_OTHER): Payer: BC Managed Care – PPO | Admitting: Internal Medicine

## 2013-11-22 ENCOUNTER — Encounter: Payer: Self-pay | Admitting: Internal Medicine

## 2013-11-22 ENCOUNTER — Other Ambulatory Visit (INDEPENDENT_AMBULATORY_CARE_PROVIDER_SITE_OTHER): Payer: BC Managed Care – PPO

## 2013-11-22 VITALS — BP 138/82 | HR 88 | Temp 98.2°F | Resp 16 | Ht 72.0 in | Wt 168.0 lb

## 2013-11-22 DIAGNOSIS — M25559 Pain in unspecified hip: Secondary | ICD-10-CM

## 2013-11-22 DIAGNOSIS — IMO0002 Reserved for concepts with insufficient information to code with codable children: Secondary | ICD-10-CM

## 2013-11-22 DIAGNOSIS — I10 Essential (primary) hypertension: Secondary | ICD-10-CM

## 2013-11-22 DIAGNOSIS — R7309 Other abnormal glucose: Secondary | ICD-10-CM

## 2013-11-22 DIAGNOSIS — M5416 Radiculopathy, lumbar region: Secondary | ICD-10-CM

## 2013-11-22 LAB — HEMOGLOBIN A1C: Hgb A1c MFr Bld: 5.2 % (ref 4.6–6.5)

## 2013-11-22 LAB — BASIC METABOLIC PANEL
BUN: 11 mg/dL (ref 6–23)
CALCIUM: 9.5 mg/dL (ref 8.4–10.5)
CO2: 27 meq/L (ref 19–32)
CREATININE: 0.8 mg/dL (ref 0.4–1.5)
Chloride: 105 mEq/L (ref 96–112)
GFR: 100.74 mL/min (ref >60.00)
GLUCOSE: 95 mg/dL (ref 70–99)
Potassium: 4.3 mEq/L (ref 3.5–5.1)
SODIUM: 139 meq/L (ref 135–145)

## 2013-11-22 MED ORDER — HYDROCODONE-ACETAMINOPHEN 5-325 MG PO TABS: 1.0000 | ORAL_TABLET | Freq: Four times a day (QID) | ORAL | Status: DC | PRN

## 2013-11-22 NOTE — Progress Notes (Signed)
Subjective:    Patient ID: Mark Cohen, male    DOB: March 19, 1951, 63 y.o.   MRN: [id number]  Arthritis Presents for follow-up visit. He complains of pain. He reports no stiffness, joint swelling or joint warmth. Affected locations include the left hip. His pain is at a severity of 3/10. Associated symptoms include pain at night and pain while resting. Pertinent negatives include no diarrhea, dry eyes, dry mouth, dysuria, fatigue, fever, rash, Raynaud's syndrome, uveitis or weight loss. His past medical history is significant for osteoarthritis. His pertinent risk factors include overuse. Past treatments include NSAIDs, acetaminophen and an opioid. The treatment provided significant relief. Factors aggravating his arthritis include activity. Compliance with prior treatments has been good.      Review of Systems  Constitutional: Negative.  Negative for fever, chills, weight loss, diaphoresis, appetite change and fatigue.  HENT: Negative.   Eyes: Negative.   Respiratory: Negative.  Negative for cough, choking, chest tightness, shortness of breath and stridor.   Cardiovascular: Negative.  Negative for chest pain, palpitations and leg swelling.  Gastrointestinal: Negative.  Negative for nausea, vomiting, abdominal pain, diarrhea, constipation and blood in stool.  Endocrine: Negative.   Genitourinary: Negative.  Negative for dysuria.  Musculoskeletal: Positive for arthralgias and arthritis. Negative for back pain, gait problem, joint swelling, myalgias, neck pain, neck stiffness and stiffness.  Skin: Negative.  Negative for rash.  Allergic/Immunologic: Negative.   Neurological: Negative.   Hematological: Negative.  Negative for adenopathy. Does not bruise/bleed easily.  Psychiatric/Behavioral: Negative.        Objective:   Physical Exam  Vitals reviewed. Constitutional: He is oriented to person, place, and time. He appears well-developed and well-nourished. No distress.  HENT:    Head: Normocephalic and atraumatic.  Mouth/Throat: Oropharynx is clear and moist. No oropharyngeal exudate.  Eyes: Conjunctivae are normal. Right eye exhibits no discharge. Left eye exhibits no discharge. No scleral icterus.  Neck: Normal range of motion. Neck supple. No JVD present. No tracheal deviation present. No thyromegaly present.  Cardiovascular: Normal rate, regular rhythm, normal heart sounds and intact distal pulses.  Exam reveals no gallop and no friction rub.   No murmur heard. Pulmonary/Chest: Effort normal and breath sounds normal. No stridor. No respiratory distress. He has no wheezes. He has no rales. He exhibits no tenderness.  Abdominal: Soft. Bowel sounds are normal. He exhibits no distension and no mass. There is no tenderness. There is no rebound and no guarding.  Musculoskeletal: Normal range of motion. He exhibits no edema and no tenderness.       Left hip: Normal. He exhibits normal range of motion, normal strength, no tenderness, no bony tenderness, no swelling, no crepitus, no deformity and no laceration.  Lymphadenopathy:    He has no cervical adenopathy.  Neurological: He is oriented to person, place, and time.  Skin: Skin is warm and dry. No rash noted. He is not diaphoretic. No erythema. No pallor.  Psychiatric: He has a normal mood and affect. His behavior is normal. Judgment and thought content normal.    Lab Results  Component Value Date   WBC 6.3 01/18/2013   HGB 16.6 01/18/2013   HCT 48.6 01/18/2013   PLT 204.0 01/18/2013   GLUCOSE 100* 01/18/2013   CHOL 173 01/18/2013   TRIG 35.0 01/18/2013   HDL 80.90 01/18/2013   LDLCALC 85 01/18/2013   ALT 19 01/18/2013   AST 25 01/18/2013   NA 139 01/18/2013   K 4.5 01/18/2013   CL  103 01/18/2013   CREATININE 0.9 01/18/2013   BUN 15 01/18/2013   CO2 30 01/18/2013   TSH 1.66 01/18/2013   PSA 0.40 04/28/2011   HGBA1C 5.2 08/12/2011        Assessment & Plan:

## 2013-11-22 NOTE — Progress Notes (Signed)
Pre visit review using our clinic review tool, if applicable. No additional management support is needed unless otherwise documented below in the visit note. 

## 2013-11-22 NOTE — Assessment & Plan Note (Signed)
His BP is well controlled I will monitor his lytes and renal function today 

## 2013-11-22 NOTE — Patient Instructions (Signed)
Osteoarthritis Osteoarthritis is a disease that causes soreness and swelling (inflammation) of a joint. It occurs when the cartilage at the affected joint wears down. Cartilage acts as a cushion, covering the ends of bones where they meet to form a joint. Osteoarthritis is the most common form of arthritis. It often occurs in older people. The joints affected most often by this condition include those in the:  Ends of the fingers.  Thumbs.  Neck.  Lower back.  Knees.  Hips. CAUSES  Over time, the cartilage that covers the ends of bones begins to wear away. This causes bone to rub on bone, producing pain and stiffness in the affected joints.  RISK FACTORS Certain factors can increase your chances of having osteoarthritis, including:  Older age.  Excessive body weight.  Overuse of joints. SIGNS AND SYMPTOMS   Pain, swelling, and stiffness in the joint.  Over time, the joint may lose its normal shape.  Small deposits of bone (osteophytes) may grow on the edges of the joint.  Bits of bone or cartilage can break off and float inside the joint space. This may cause more pain and damage. DIAGNOSIS  Your health care provider will do a physical exam and ask about your symptoms. Various tests may be ordered, such as:  X-rays of the affected joint.  An MRI scan.  Blood tests to rule out other types of arthritis.  Joint fluid tests. This involves using a needle to draw fluid from the joint and examining the fluid under a microscope. TREATMENT  Goals of treatment are to control pain and improve joint function. Treatment plans may include:  A prescribed exercise program that allows for rest and joint relief.  A weight control plan.  Pain relief techniques, such as:  Properly applied heat and cold.  Electric pulses delivered to nerve endings under the skin (transcutaneous electrical nerve stimulation, TENS).  Massage.  Certain nutritional supplements.  Medicines to  control pain, such as:  Acetaminophen.  Nonsteroidal anti-inflammatory drugs (NSAIDs), such as naproxen.  Narcotic or central-acting agents, such as tramadol.  Corticosteroids. These can be given orally or as an injection.  Surgery to reposition the bones and relieve pain (osteotomy) or to remove loose pieces of bone and cartilage. Joint replacement may be needed in advanced states of osteoarthritis. HOME CARE INSTRUCTIONS   Only take over-the-counter or prescription medicines as directed by your health care provider. Take all medicines exactly as instructed.  Maintain a healthy weight. Follow your health care provider's instructions for weight control. This may include dietary instructions.  Exercise as directed. Your health care provider can recommend specific types of exercise. These may include:  Strengthening exercises These are done to strengthen the muscles that support joints affected by arthritis. They can be performed with weights or with exercise bands to add resistance.  Aerobic activities These are exercises, such as brisk walking or low-impact aerobics, that get your heart pumping.  Range-of-motion activities These keep your joints limber.  Balance and agility exercises These help you maintain daily living skills.  Rest your affected joints as directed by your health care provider.  Follow up with your health care provider as directed. SEEK MEDICAL CARE IF:   Your skin turns red.  You develop a rash in addition to your joint pain.  You have worsening joint pain. SEEK IMMEDIATE MEDICAL CARE IF:  You have a significant loss of weight or appetite.  You have a fever along with joint or muscle aches.  You have   night sweats. FOR MORE INFORMATION  National Institute of Arthritis and Musculoskeletal and Skin Diseases: www.niams.nih.gov National Institute on Aging: www.nia.nih.gov American College of Rheumatology: www.rheumatology.org Document Released: 06/27/2005  Document Revised: 04/17/2013 Document Reviewed: 03/04/2013 [employer] Patient Information 2014 [employer].  

## 2013-11-22 NOTE — Assessment & Plan Note (Signed)
His low back pain has improved

## 2013-11-22 NOTE — Assessment & Plan Note (Signed)
He will cont the current meds for pain 

## 2013-11-22 NOTE — Assessment & Plan Note (Signed)
I will check his A1C to see if he has developed DM2 

## 2013-12-10 ENCOUNTER — Encounter: Payer: Self-pay | Admitting: Internal Medicine

## 2013-12-25 ENCOUNTER — Other Ambulatory Visit: Payer: Self-pay | Admitting: Internal Medicine

## 2014-03-14 ENCOUNTER — Other Ambulatory Visit: Payer: Self-pay | Admitting: Physician Assistant

## 2014-08-06 ENCOUNTER — Other Ambulatory Visit: Payer: Self-pay | Admitting: Internal Medicine

## 2015-01-21 ENCOUNTER — Other Ambulatory Visit (INDEPENDENT_AMBULATORY_CARE_PROVIDER_SITE_OTHER): Payer: BLUE CROSS/BLUE SHIELD

## 2015-01-21 ENCOUNTER — Encounter: Payer: Self-pay | Admitting: Internal Medicine

## 2015-01-21 ENCOUNTER — Ambulatory Visit (INDEPENDENT_AMBULATORY_CARE_PROVIDER_SITE_OTHER): Payer: BLUE CROSS/BLUE SHIELD | Admitting: Internal Medicine

## 2015-01-21 VITALS — BP 130/86 | HR 73 | Temp 98.0°F | Resp 11 | Ht 72.0 in | Wt 164.0 lb

## 2015-01-21 DIAGNOSIS — F411 Generalized anxiety disorder: Secondary | ICD-10-CM | POA: Diagnosis not present

## 2015-01-21 DIAGNOSIS — R739 Hyperglycemia, unspecified: Secondary | ICD-10-CM

## 2015-01-21 DIAGNOSIS — M5416 Radiculopathy, lumbar region: Secondary | ICD-10-CM | POA: Diagnosis not present

## 2015-01-21 DIAGNOSIS — Z0000 Encounter for general adult medical examination without abnormal findings: Secondary | ICD-10-CM | POA: Diagnosis not present

## 2015-01-21 DIAGNOSIS — Z23 Encounter for immunization: Secondary | ICD-10-CM | POA: Diagnosis not present

## 2015-01-21 DIAGNOSIS — I10 Essential (primary) hypertension: Secondary | ICD-10-CM | POA: Diagnosis not present

## 2015-01-21 LAB — PSA: PSA: 0.5 ng/mL (ref 0.10–4.00)

## 2015-01-21 LAB — CBC WITH DIFFERENTIAL/PLATELET
BASOS ABS: 0 10*3/uL (ref 0.0–0.1)
BASOS PCT: 0.3 % (ref 0.0–3.0)
EOS ABS: 0.1 10*3/uL (ref 0.0–0.7)
Eosinophils Relative: 1.7 % (ref 0.0–5.0)
HCT: 47.5 % (ref 39.0–52.0)
HEMOGLOBIN: 16.1 g/dL (ref 13.0–17.0)
LYMPHS ABS: 1.5 10*3/uL (ref 0.7–4.0)
Lymphocytes Relative: 17.5 % (ref 12.0–46.0)
MCHC: 33.8 g/dL (ref 30.0–36.0)
MCV: 93.6 fl (ref 78.0–100.0)
MONO ABS: 0.5 10*3/uL (ref 0.1–1.0)
Monocytes Relative: 5.8 % (ref 3.0–12.0)
NEUTROS ABS: 6.3 10*3/uL (ref 1.4–7.7)
Neutrophils Relative %: 74.7 % (ref 43.0–77.0)
PLATELETS: 218 10*3/uL (ref 150.0–400.0)
RBC: 5.07 Mil/uL (ref 4.22–5.81)
RDW: 13.2 % (ref 11.5–15.5)
WBC: 8.4 10*3/uL (ref 4.0–10.5)

## 2015-01-21 LAB — COMPREHENSIVE METABOLIC PANEL
ALK PHOS: 70 U/L (ref 39–117)
ALT: 19 U/L (ref 0–53)
AST: 23 U/L (ref 0–37)
Albumin: 4.7 g/dL (ref 3.5–5.2)
BUN: 11 mg/dL (ref 6–23)
CHLORIDE: 104 meq/L (ref 96–112)
CO2: 29 meq/L (ref 19–32)
CREATININE: 0.93 mg/dL (ref 0.40–1.50)
Calcium: 9.8 mg/dL (ref 8.4–10.5)
GFR: 86.79 mL/min (ref >60.00)
GLUCOSE: 133 mg/dL — AB (ref 70–99)
Potassium: 4.3 mEq/L (ref 3.5–5.1)
SODIUM: 140 meq/L (ref 135–145)
Total Bilirubin: 0.7 mg/dL (ref 0.2–1.2)
Total Protein: 7 g/dL (ref 6.0–8.3)

## 2015-01-21 LAB — LIPID PANEL
CHOL/HDL RATIO: 2
Cholesterol: 154 mg/dL (ref 0–200)
HDL: 73.6 mg/dL (ref >39.00)
LDL Cholesterol: 66 mg/dL (ref 0–99)
NonHDL: 80.4
Triglycerides: 73 mg/dL (ref 0.0–149.0)
VLDL: 14.6 mg/dL (ref 0.0–40.0)

## 2015-01-21 LAB — URINALYSIS, ROUTINE W REFLEX MICROSCOPIC
Bilirubin Urine: NEGATIVE
Hgb urine dipstick: NEGATIVE
Ketones, ur: NEGATIVE
Leukocytes, UA: NEGATIVE
NITRITE: NEGATIVE
Total Protein, Urine: NEGATIVE
Urine Glucose: NEGATIVE
Urobilinogen, UA: 0.2 (ref 0.0–1.0)
pH: 5.5 (ref 5.0–8.0)

## 2015-01-21 LAB — HEMOGLOBIN A1C: HEMOGLOBIN A1C: 5.2 % (ref 4.6–6.5)

## 2015-01-21 LAB — TSH: TSH: 1.78 u[IU]/mL (ref 0.35–4.50)

## 2015-01-21 MED ORDER — HYDROCODONE-ACETAMINOPHEN 5-325 MG PO TABS: 1.0000 | ORAL_TABLET | Freq: Four times a day (QID) | ORAL | Status: DC | PRN

## 2015-01-21 MED ORDER — ALPRAZOLAM 0.25 MG PO TABS: 0.2500 mg | ORAL_TABLET | Freq: Two times a day (BID) | ORAL | Status: DC | PRN

## 2015-01-21 NOTE — Progress Notes (Signed)
Subjective:  Patient ID: Mark Cohen, male    DOB: 09-13-1950  Age: 64 y.o. MRN: [id number]  CC: Hypertension; Annual Exam; and Back Pain   HPI Mark Cohen presents for a complete physical as well as a blood pressure check and refill medication for chronic low back pain. He reports that overall the pain is a little worse over the last year. There is some radiation into the right lower extremity with numbness and tingling in the right lower extremity as well. 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  Outpatient Prescriptions Prior to Visit  Medication Sig Dispense Refill  . aspirin 325 MG tablet Take 325 mg by mouth as needed.     . cholecalciferol (VITAMIN D) 1000 UNITS tablet Take 1,000 Units by mouth daily.      . fluticasone (FLONASE) 50 MCG/ACT nasal spray Place 2 sprays into the nose as directed.      Marland Kitchen ALPRAZolam (XANAX) 0.25 MG tablet TAKE 1 TABLET BY MOUTH TWICE DAILY AS NEEDED 35 tablet 1  . HYDROcodone-acetaminophen (NORCO/VICODIN) 5-325 MG per tablet Take 1 tablet by mouth every 6 (six) hours as needed. 65 tablet 0  . B Complex-C-Folic Acid (MULTIVITAMIN, STRESS FORMULA) tablet Take 1 tablet by mouth daily.      Marland Kitchen BETAMETHASONE PO Apply topically as needed.     Marland Kitchen olmesartan (BENICAR) 40 MG tablet Take 1 tablet (40 mg total) by mouth daily. 84 tablet 0  . celecoxib (CELEBREX) 200 MG capsule Take 1 capsule (200 mg total) by mouth daily. (Patient not taking: Reported on 01/21/2015) 90 capsule 3   No facility-administered medications prior to visit.    ROS Review of Systems  Constitutional: Negative.  Negative for fever, chills, diaphoresis, appetite change and fatigue.  HENT: Negative.  Negative for trouble swallowing and voice change.   Eyes: Negative.   Respiratory: Negative.  Negative for cough, choking, chest tightness, shortness of breath and stridor.   Cardiovascular: Negative.   Negative for chest pain, palpitations and leg swelling.  Gastrointestinal: Negative.  Negative for nausea, vomiting, abdominal pain, diarrhea, constipation and blood in stool.  Endocrine: Negative.  Negative for polydipsia, polyphagia and polyuria.  Genitourinary: Negative.  Negative for dysuria, hematuria and difficulty urinating.  Musculoskeletal: Positive for back pain. Negative for myalgias, joint swelling, arthralgias, gait problem, neck pain and neck stiffness.  Skin: Negative.  Negative for rash.  Allergic/Immunologic: Negative.   Neurological: Negative.   Hematological: Negative.  Negative for adenopathy. Does not bruise/bleed easily.  Psychiatric/Behavioral: Negative for suicidal ideas, hallucinations, behavioral problems, confusion, sleep disturbance, self-injury, dysphoric mood, decreased concentration and agitation. The patient is nervous/anxious. The patient is not hyperactive.     Objective:  BP 130/86 mmHg  Pulse 73  Temp(Src) 98 F (36.7 C) (Oral)  Resp 11  Ht 6' (1.829 m)  Wt 164 lb (74.39 kg)  BMI 22.24 kg/m2  SpO2 98%  BP Readings from Last 3 Encounters:  01/21/15 130/86  11/22/13 138/82  05/10/13 122/86    Wt Readings from Last 3 Encounters:  01/21/15 164 lb (74.39 kg)  11/22/13 168 lb (76.204 kg)  05/10/13 169 lb 12 oz (76.998 kg)    Physical Exam  Constitutional: He is oriented to person, place, and time. He appears well-developed and well-nourished. No distress.  HENT:  Head: Normocephalic and atraumatic.  Mouth/Throat: Oropharynx is clear and moist. No oropharyngeal exudate.  Eyes: Conjunctivae are normal. Right eye exhibits no discharge. Left eye exhibits no discharge. No scleral icterus.  Neck: Normal range of motion. Neck supple. No JVD present. No tracheal deviation present. No thyromegaly present.  Cardiovascular: Normal rate, regular rhythm, normal heart sounds and intact distal pulses.  Exam reveals no gallop and no friction rub.   No murmur  heard. Pulmonary/Chest: Effort normal and breath sounds normal. No stridor. No respiratory distress. He has no wheezes. He has no rales. He exhibits no tenderness.  Abdominal: Soft. Bowel sounds are normal. He exhibits no distension and no mass. There is no tenderness. There is no rebound and no guarding.  Musculoskeletal: Normal range of motion. He exhibits no edema or tenderness.       Lumbar back: Normal. He exhibits normal range of motion, no tenderness, no bony tenderness, no swelling, no edema, no deformity, no laceration, no pain, no spasm and normal pulse.  Lymphadenopathy:    He has no cervical adenopathy.  Neurological: He is oriented to person, place, and time. He has normal strength. He displays no atrophy, no tremor and normal reflexes. No cranial nerve deficit or sensory deficit. He exhibits normal muscle tone. He displays a negative Romberg sign. He displays no seizure activity. Coordination and gait normal. He displays no Babinski's sign on the right side. He displays no Babinski's sign on the left side.  Skin: Skin is warm and dry. No rash noted. He is not diaphoretic. No erythema. No pallor.  Psychiatric: He has a normal mood and affect. His behavior is normal. Judgment and thought content normal.  Vitals reviewed.   Lab Results  Component Value Date   WBC 8.4 01/21/2015   HGB 16.1 01/21/2015   HCT 47.5 01/21/2015   PLT 218.0 01/21/2015   GLUCOSE 133* 01/21/2015   CHOL 154 01/21/2015   TRIG 73.0 01/21/2015   HDL 73.60 01/21/2015   LDLCALC 66 01/21/2015   ALT 19 01/21/2015   AST 23 01/21/2015   NA 140 01/21/2015   K 4.3 01/21/2015   CL 104 01/21/2015   CREATININE 0.93 01/21/2015   BUN 11 01/21/2015   CO2 29 01/21/2015   TSH 1.78 01/21/2015   PSA 0.50 01/21/2015   HGBA1C 5.2 01/21/2015    Dg Femur Left  05/10/2013   CLINICAL DATA:  Leg numbness  EXAM: LEFT FEMUR - 2 VIEW  COMPARISON:  01/18/2013  FINDINGS: No acute fracture. No dislocation. Mild degenerative  changes of the hip joint. Moderate degenerative change of the knee joint.  IMPRESSION: No acute bony pathology.   Electronically Signed   By: Maryclare Bean M.D.   On: 05/10/2013 09:09    Assessment & Plan:   Ada was seen today for hypertension, annual exam and back pain.  Diagnoses and all orders for this visit:  Routine general medical examination at a health care facility- exam done, labs ordered and reviewed, the vaccines were reviewed and updated, he was given patient education material. Colonoscopy is up-to-date. Orders: -     Lipid panel; Future -     Comprehensive metabolic panel; Future -     CBC with Differential/Platelet; Future -     TSH; Future -     Urinalysis, Routine w reflex microscopic (not at [facility]); Future -     PSA; Future  Essential hypertension, benign- his blood pressure is well controlled, lites and renal function are stable.  Hyperglycemia- he has very mild hyperglycemia, will continue his lifestyle modifications. 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 I've also asked him to have a follow-up examination with his neurosurgeon. Orders: -     Ambulatory referral to Neurosurgery -     MR Lumbar Spine Wo Contrast; Future -     HYDROcodone-acetaminophen (NORCO/VICODIN) 5-325 MG per tablet; Take 1 tablet by mouth every 6 (six) hours as needed.  GAD (generalized anxiety disorder) Orders: -     ALPRAZolam (XANAX) 0.25 MG tablet; Take 1 tablet (0.25 mg total) by mouth 2 (two) times daily as needed.  Need for prophylactic vaccination against Streptococcus pneumoniae (pneumococcus) Orders: -     Pneumococcal polysaccharide vaccine 23-valent greater than or equal to 2yo subcutaneous/IM   I have discontinued Mr. Conover celecoxib. I have also changed his ALPRAZolam. Additionally, I am having him maintain his  aspirin, fluticasone, (multivitamin, stress formula), cholecalciferol, BETAMETHASONE PO, olmesartan, and HYDROcodone-acetaminophen.  Meds ordered this encounter  Medications  . HYDROcodone-acetaminophen (NORCO/VICODIN) 5-325 MG per tablet    Sig: Take 1 tablet by mouth every 6 (six) hours as needed.    Dispense:  65 tablet    Refill:  0    Fill on or after 01/21/15  . ALPRAZolam (XANAX) 0.25 MG tablet    Sig: Take 1 tablet (0.25 mg total) by mouth 2 (two) times daily as needed.    Dispense:  35 tablet    Refill:  1     Follow-up: Return in about 4 weeks (around 02/18/2015).  Scarlette Calico, MD

## 2015-01-21 NOTE — Patient Instructions (Signed)

## 2015-01-21 NOTE — Progress Notes (Signed)
Pre visit review using our clinic review tool, if applicable. No additional management support is needed unless otherwise documented below in the visit note. 

## 2015-02-12 ENCOUNTER — Inpatient Hospital Stay: Admission: RE | Admit: 2015-02-12 | Payer: BLUE CROSS/BLUE SHIELD | Source: Ambulatory Visit

## 2015-05-14 ENCOUNTER — Other Ambulatory Visit: Payer: Self-pay | Admitting: Internal Medicine

## 2015-05-14 DIAGNOSIS — M5416 Radiculopathy, lumbar region: Secondary | ICD-10-CM

## 2015-05-14 NOTE — Telephone Encounter (Signed)
Pt requesting refill for HYDROcodone-acetaminophen (NORCO/VICODIN) 5-325 MG per tablet [[id number]]  Patient is aware Dr. Ronnald Ramp is out till next week which is fine with him. He still has enough for a while

## 2015-05-15 NOTE — Telephone Encounter (Signed)
Pt requesting rf on pended med 

## 2015-05-17 MED ORDER — HYDROCODONE-ACETAMINOPHEN 5-325 MG PO TABS: 1.0000 | ORAL_TABLET | Freq: Four times a day (QID) | ORAL | Status: DC | PRN

## 2015-05-18 NOTE — Telephone Encounter (Signed)
LMOVM script at front

## 2015-06-23 ENCOUNTER — Other Ambulatory Visit: Payer: Self-pay | Admitting: Internal Medicine

## 2015-06-24 NOTE — Telephone Encounter (Signed)
Faxed back to [facility]...Johny Chess

## 2015-07-21 DIAGNOSIS — H518 Other specified disorders of binocular movement: Secondary | ICD-10-CM | POA: Diagnosis not present

## 2015-09-23 DIAGNOSIS — N509 Disorder of male genital organs, unspecified: Secondary | ICD-10-CM | POA: Diagnosis not present

## 2015-09-23 DIAGNOSIS — Z0000 Encounter for general adult medical examination without abnormal findings: Secondary | ICD-10-CM | POA: Diagnosis not present

## 2015-09-23 DIAGNOSIS — N500 Atrophy of testis: Secondary | ICD-10-CM | POA: Diagnosis not present

## 2015-09-24 ENCOUNTER — Other Ambulatory Visit (HOSPITAL_COMMUNITY): Payer: Self-pay | Admitting: Urology

## 2015-09-24 DIAGNOSIS — N500 Atrophy of testis: Secondary | ICD-10-CM

## 2015-09-29 ENCOUNTER — Ambulatory Visit (HOSPITAL_COMMUNITY)
Admission: RE | Admit: 2015-09-29 | Discharge: 2015-09-29 | Disposition: A | Discharge status: Home / Self Care | Payer: Medicare Other | Source: Ambulatory Visit | Attending: Urology | Admitting: Urology

## 2015-09-29 DIAGNOSIS — N4341 Spermatocele of epididymis, single: Secondary | ICD-10-CM | POA: Diagnosis not present

## 2015-09-29 DIAGNOSIS — N500 Atrophy of testis: Secondary | ICD-10-CM

## 2015-09-29 DIAGNOSIS — N503 Cyst of epididymis: Secondary | ICD-10-CM | POA: Insufficient documentation

## 2015-09-29 MED ORDER — GADOBENATE DIMEGLUMINE 529 MG/ML IV SOLN
15.0000 mL | Freq: Once | INTRAVENOUS | Status: AC | PRN
  Administered 2015-09-29: 15 mL via INTRAVENOUS

## 2015-10-01 DIAGNOSIS — R278 Other lack of coordination: Secondary | ICD-10-CM | POA: Diagnosis not present

## 2015-10-01 DIAGNOSIS — M62838 Other muscle spasm: Secondary | ICD-10-CM | POA: Diagnosis not present

## 2015-10-01 DIAGNOSIS — R1030 Lower abdominal pain, unspecified: Secondary | ICD-10-CM | POA: Diagnosis not present

## 2015-10-01 DIAGNOSIS — M545 Low back pain: Secondary | ICD-10-CM | POA: Diagnosis not present

## 2015-10-05 DIAGNOSIS — N4341 Spermatocele of epididymis, single: Secondary | ICD-10-CM | POA: Diagnosis not present

## 2016-01-14 ENCOUNTER — Encounter: Payer: Self-pay | Admitting: Internal Medicine

## 2016-01-14 ENCOUNTER — Ambulatory Visit (INDEPENDENT_AMBULATORY_CARE_PROVIDER_SITE_OTHER): Payer: Medicare Other | Admitting: Internal Medicine

## 2016-01-14 VITALS — BP 120/88 | HR 74 | Temp 97.9°F | Resp 16 | Ht 72.0 in | Wt 164.8 lb

## 2016-01-14 DIAGNOSIS — E785 Hyperlipidemia, unspecified: Secondary | ICD-10-CM | POA: Diagnosis not present

## 2016-01-14 DIAGNOSIS — M150 Primary generalized (osteo)arthritis: Secondary | ICD-10-CM

## 2016-01-14 DIAGNOSIS — F411 Generalized anxiety disorder: Secondary | ICD-10-CM

## 2016-01-14 DIAGNOSIS — I10 Essential (primary) hypertension: Secondary | ICD-10-CM

## 2016-01-14 DIAGNOSIS — M159 Polyosteoarthritis, unspecified: Secondary | ICD-10-CM

## 2016-01-14 DIAGNOSIS — M5416 Radiculopathy, lumbar region: Secondary | ICD-10-CM

## 2016-01-14 DIAGNOSIS — R739 Hyperglycemia, unspecified: Secondary | ICD-10-CM

## 2016-01-14 DIAGNOSIS — Z0000 Encounter for general adult medical examination without abnormal findings: Secondary | ICD-10-CM

## 2016-01-14 DIAGNOSIS — M8949 Other hypertrophic osteoarthropathy, multiple sites: Secondary | ICD-10-CM

## 2016-01-14 DIAGNOSIS — Z1159 Encounter for screening for other viral diseases: Secondary | ICD-10-CM | POA: Insufficient documentation

## 2016-01-14 MED ORDER — HYDROCODONE-ACETAMINOPHEN 5-325 MG PO TABS: 1.0000 | ORAL_TABLET | Freq: Four times a day (QID) | ORAL | Status: DC | PRN

## 2016-01-14 MED ORDER — CELECOXIB 200 MG PO CAPS: 200.0000 mg | ORAL_CAPSULE | Freq: Two times a day (BID) | ORAL | Status: DC

## 2016-01-14 MED ORDER — ALPRAZOLAM 0.25 MG PO TABS: 0.2500 mg | ORAL_TABLET | Freq: Two times a day (BID) | ORAL | Status: DC | PRN

## 2016-01-14 NOTE — Patient Instructions (Signed)

## 2016-01-14 NOTE — Progress Notes (Signed)
Subjective:  Patient ID: Mark Cohen, male    DOB: 15-May-1951  Age: 65 y.o. MRN: [id number]  CC: Annual Exam; Hypertension; Hyperlipidemia; and Back Pain   HPI DJ MCCRANIE presents for a CPX.  He tells me that his blood pressures been well controlled on no medications. He is very diligent about his lifestyle modifications.  He is due for a lipid panel. Other than being male and his age he has no significant risk factors for coronary artery disease or CVA.  He complains of persistent back pain and wants to restart Celebrex. He stopped taking it years ago because he heard bad things about it but now he is not concerned about the side effects of Celebrex so he had some around the house and started taking them a few weeks ago and says it is helped his low back pain. His low back pain is stable and does not radiate into his lower extremities and he denies paresthesias.  He saw his urologist, Dr. Gaynelle Arabian, about 3 months ago and is having an MRI done because of the asymmetry in his testicles.    Past Medical History  Diagnosis Date  . Allergic rhinitis   . GERD (gastroesophageal reflux disease)   . Colon polyps    Past Surgical History  Procedure Laterality Date  . Epididymectomy  2004    Sees Dr Gaynelle Arabian twice a year  . Appendectomy      reports that he has never smoked. He has never used smokeless tobacco. He reports that he does not drink alcohol or use illicit drugs. family history is negative for Cancer. No Known Allergies  Outpatient Prescriptions Prior to Visit  Medication Sig Dispense Refill  . BETAMETHASONE PO Apply topically as needed.     . cholecalciferol (VITAMIN D) 1000 UNITS tablet Take 1,000 Units by mouth daily.      . fluticasone (FLONASE) 50 MCG/ACT nasal spray Place 2 sprays into the nose as directed.      Marland Kitchen ALPRAZolam (XANAX) 0.25 MG tablet TAKE 1 TABLET BY MOUTH TWICE DAILY AS NEEDED 35 tablet 2  . HYDROcodone-acetaminophen (NORCO/VICODIN)  5-325 MG tablet Take 1 tablet by mouth every 6 (six) hours as needed. 65 tablet 0  . olmesartan (BENICAR) 40 MG tablet Take 1 tablet (40 mg total) by mouth daily. 84 tablet 0  . aspirin 325 MG tablet Take 325 mg by mouth as needed.     . B Complex-C-Folic Acid (MULTIVITAMIN, STRESS FORMULA) tablet Take 1 tablet by mouth daily.       No facility-administered medications prior to visit.    ROS Review of Systems  Constitutional: Negative.  Negative for diaphoresis, activity change and fatigue.  HENT: Negative.  Negative for trouble swallowing.   Eyes: Negative.  Negative for visual disturbance.  Respiratory: Negative.  Negative for cough, choking, chest tightness, shortness of breath and stridor.   Cardiovascular: Negative.  Negative for chest pain and leg swelling.  Gastrointestinal: Negative.  Negative for abdominal pain.  Endocrine: Negative.   Genitourinary: Negative.   Musculoskeletal: Positive for back pain. Negative for myalgias, joint swelling, arthralgias and neck pain.  Skin: Negative.  Negative for color change.  Allergic/Immunologic: Negative.   Neurological: Negative.  Negative for dizziness, weakness, light-headedness and numbness.  Hematological: Negative.  Negative for adenopathy. Does not bruise/bleed easily.  Psychiatric/Behavioral: Negative for suicidal ideas, confusion, sleep disturbance, dysphoric mood and decreased concentration. The patient is nervous/anxious.     Objective:  BP 120/88 mmHg  Pulse  74  Temp(Src) 97.9 F (36.6 C) (Oral)  Resp 16  Ht 6' (1.829 m)  Wt 164 lb 12.8 oz (74.753 kg)  BMI 22.35 kg/m2  SpO2 96%  BP Readings from Last 3 Encounters:  01/14/16 120/88  01/21/15 130/86  11/22/13 138/82    Wt Readings from Last 3 Encounters:  01/14/16 164 lb 12.8 oz (74.753 kg)  01/21/15 164 lb (74.39 kg)  11/22/13 168 lb (76.204 kg)    Physical Exam  Constitutional: He is oriented to person, place, and time. No distress.  HENT:  Head:  Normocephalic and atraumatic.  Mouth/Throat: Oropharynx is clear and moist. No oropharyngeal exudate.  Eyes: Conjunctivae are normal. Right eye exhibits no discharge. Left eye exhibits no discharge. No scleral icterus.  Neck: Normal range of motion. Neck supple. No JVD present. No tracheal deviation present. No thyromegaly present.  Cardiovascular: Normal rate, regular rhythm, normal heart sounds and intact distal pulses.  Exam reveals no gallop and no friction rub.   No murmur heard. Pulmonary/Chest: Effort normal and breath sounds normal. No stridor. No respiratory distress. He has no wheezes. He has no rales. He exhibits no tenderness.  Abdominal: Soft. Bowel sounds are normal. He exhibits no distension and no mass. There is no tenderness. There is no rebound and no guarding.  Genitourinary:  GU and rectal exams were deferred at his request since he just saw his urologist 3 months ago  Musculoskeletal: Normal range of motion. He exhibits no edema or tenderness.       Lumbar back: Normal.  Lymphadenopathy:    He has no cervical adenopathy.  Neurological: He is oriented to person, place, and time. He has normal reflexes. He displays normal reflexes. He exhibits normal muscle tone. Coordination normal.  Neg SLR in BLE  Skin: Skin is warm and dry. No rash noted. He is not diaphoretic. No erythema. No pallor.  Psychiatric: His speech is normal and behavior is normal. Thought content normal. His mood appears not anxious. Cognition and memory are normal. He does not exhibit a depressed mood.  Vitals reviewed.   Lab Results  Component Value Date   WBC 8.4 01/21/2015   HGB 16.1 01/21/2015   HCT 47.5 01/21/2015   PLT 218.0 01/21/2015   GLUCOSE 133* 01/21/2015   CHOL 154 01/21/2015   TRIG 73.0 01/21/2015   HDL 73.60 01/21/2015   LDLCALC 66 01/21/2015   ALT 19 01/21/2015   AST 23 01/21/2015   NA 140 01/21/2015   K 4.3 01/21/2015   CL 104 01/21/2015   CREATININE 0.93 01/21/2015   BUN 11  01/21/2015   CO2 29 01/21/2015   TSH 1.78 01/21/2015   PSA 0.50 01/21/2015   HGBA1C 5.2 01/21/2015    Mr Pelvis W Wo Contrast  09/29/2015  CLINICAL DATA:  Right inguinal canal pain, evaluate for inguinal hernia, multiple right epididymal cysts EXAM: MRI PELVIS WITHOUT AND WITH CONTRAST TECHNIQUE: Multiplanar multisequence MR imaging of the pelvis was performed both before and after administration of intravenous contrast. CONTRAST:  62mL MULTIHANCE GADOBENATE DIMEGLUMINE 529 MG/ML IV SOLN COMPARISON:  [facility] scrotal ultrasound dated 11/19/2014 FINDINGS: No evidence of inguinal hernia. 9.0 x 4.3 x 6.1 cm septated cystic lesion in the right hemiscrotum adjacent to the right testis, without a few thin septations but without enhancement, compatible with a spermatocele/epididymal cyst. Prostate is grossly unremarkable. Bladder is mildly thick-walled although underdistended. Sigmoid diverticulosis with mucosal wall thickening, suggesting chronic mucosal hypertrophy. No associated inflammatory changes. No pelvic ascites. No suspicious  pelvic lymphadenopathy. No focal osseous lesions. IMPRESSION: No evidence of inguinal hernia. 9.0 x 4.3 x 6.1 cm right spermatocele/epididymal cyst. Electronically Signed   By: Julian Hy M.D.   On: 09/29/2015 16:06    Assessment & Plan:   Jahmali was seen today for annual exam, hypertension, hyperlipidemia and back pain.  Diagnoses and all orders for this visit:  Essential hypertension, benign- his blood pressure is adequately well-controlled on no medications, I will check his labs to screen for secondary causes and end organ damage. -     Comprehensive metabolic panel; Future -     CBC with Differential/Platelet; Future -     TSH; Future  Hyperglycemia- I will recheck his A1c to screen for prediabetes or diabetes, will treat if indicated. -     Comprehensive metabolic panel; Future -     Hemoglobin A1c; Future  Routine general medical examination at  a health care facility  Hyperlipidemia with target LDL less than 130- his Framingham risk was only 6% so I do not anticipate starting a statin, he is due for a follow-up fasting lipid panel and if there is been a significant change will advise further. -     Lipid panel; Future -     Comprehensive metabolic panel; Future -     TSH; Future  Need for hepatitis C screening test -     Hepatitis C antibody; Future  Primary osteoarthritis involving multiple joints -     celecoxib (CELEBREX) 200 MG capsule; Take 1 capsule (200 mg total) by mouth 2 (two) times daily. -     Discontinue: HYDROcodone-acetaminophen (NORCO/VICODIN) 5-325 MG tablet; Take 1 tablet by mouth every 6 (six) hours as needed. -     Discontinue: HYDROcodone-acetaminophen (NORCO/VICODIN) 5-325 MG tablet; Take 1 tablet by mouth every 6 (six) hours as needed. -     HYDROcodone-acetaminophen (NORCO/VICODIN) 5-325 MG tablet; Take 1 tablet by mouth every 6 (six) hours as needed.  Left lumbar radiculitis -     celecoxib (CELEBREX) 200 MG capsule; Take 1 capsule (200 mg total) by mouth 2 (two) times daily. -     Discontinue: HYDROcodone-acetaminophen (NORCO/VICODIN) 5-325 MG tablet; Take 1 tablet by mouth every 6 (six) hours as needed. -     Discontinue: HYDROcodone-acetaminophen (NORCO/VICODIN) 5-325 MG tablet; Take 1 tablet by mouth every 6 (six) hours as needed. -     HYDROcodone-acetaminophen (NORCO/VICODIN) 5-325 MG tablet; Take 1 tablet by mouth every 6 (six) hours as needed.  GAD (generalized anxiety disorder) -     ALPRAZolam (XANAX) 0.25 MG tablet; Take 1 tablet (0.25 mg total) by mouth 2 (two) times daily as needed.   I have discontinued Mr. Waldrop aspirin and (multivitamin, stress formula). I have also changed his ALPRAZolam. Additionally, I am having him start on celecoxib. Lastly, I am having him maintain his fluticasone, cholecalciferol, BETAMETHASONE PO, olmesartan, and HYDROcodone-acetaminophen.  Meds ordered this  encounter  Medications  . celecoxib (CELEBREX) 200 MG capsule    Sig: Take 1 capsule (200 mg total) by mouth 2 (two) times daily.    Dispense:  90 capsule    Refill:  3  . DISCONTD: HYDROcodone-acetaminophen (NORCO/VICODIN) 5-325 MG tablet    Sig: Take 1 tablet by mouth every 6 (six) hours as needed.    Dispense:  65 tablet    Refill:  0    Fill on or after 01/14/16  . DISCONTD: HYDROcodone-acetaminophen (NORCO/VICODIN) 5-325 MG tablet    Sig: Take 1 tablet by mouth  every 6 (six) hours as needed.    Dispense:  65 tablet    Refill:  0    Fill on or after 02/14/16  . HYDROcodone-acetaminophen (NORCO/VICODIN) 5-325 MG tablet    Sig: Take 1 tablet by mouth every 6 (six) hours as needed.    Dispense:  65 tablet    Refill:  0    Fill on or after 03/16/16  . ALPRAZolam (XANAX) 0.25 MG tablet    Sig: Take 1 tablet (0.25 mg total) by mouth 2 (two) times daily as needed.    Dispense:  35 tablet    Refill:  2   See AVS for instructions about healthy living and anticipatory guidance.  Follow-up: Return in about 6 months (around 07/16/2016).  Scarlette Calico, MD

## 2016-01-20 ENCOUNTER — Other Ambulatory Visit (INDEPENDENT_AMBULATORY_CARE_PROVIDER_SITE_OTHER): Payer: Medicare Other

## 2016-01-20 ENCOUNTER — Encounter: Payer: Self-pay | Admitting: Internal Medicine

## 2016-01-20 DIAGNOSIS — R739 Hyperglycemia, unspecified: Secondary | ICD-10-CM | POA: Diagnosis not present

## 2016-01-20 DIAGNOSIS — E785 Hyperlipidemia, unspecified: Secondary | ICD-10-CM

## 2016-01-20 DIAGNOSIS — I10 Essential (primary) hypertension: Secondary | ICD-10-CM

## 2016-01-20 DIAGNOSIS — Z1159 Encounter for screening for other viral diseases: Secondary | ICD-10-CM | POA: Diagnosis not present

## 2016-01-20 LAB — COMPREHENSIVE METABOLIC PANEL
ALK PHOS: 65 U/L (ref 39–117)
ALT: 16 U/L (ref 0–53)
AST: 20 U/L (ref 0–37)
Albumin: 4.4 g/dL (ref 3.5–5.2)
BUN: 15 mg/dL (ref 6–23)
CO2: 28 meq/L (ref 19–32)
Calcium: 9.7 mg/dL (ref 8.4–10.5)
Chloride: 106 mEq/L (ref 96–112)
Creatinine, Ser: 0.93 mg/dL (ref 0.40–1.50)
GFR: 86.52 mL/min (ref >60.00)
GLUCOSE: 104 mg/dL — AB (ref 70–99)
POTASSIUM: 4.6 meq/L (ref 3.5–5.1)
SODIUM: 140 meq/L (ref 135–145)
TOTAL PROTEIN: 6.6 g/dL (ref 6.0–8.3)
Total Bilirubin: 0.7 mg/dL (ref 0.2–1.2)

## 2016-01-20 LAB — CBC WITH DIFFERENTIAL/PLATELET
BASOS PCT: 0.5 % (ref 0.0–3.0)
Basophils Absolute: 0 10*3/uL (ref 0.0–0.1)
EOS PCT: 5.7 % — AB (ref 0.0–5.0)
Eosinophils Absolute: 0.3 10*3/uL (ref 0.0–0.7)
HCT: 45.4 % (ref 39.0–52.0)
Hemoglobin: 15.4 g/dL (ref 13.0–17.0)
LYMPHS ABS: 1.9 10*3/uL (ref 0.7–4.0)
Lymphocytes Relative: 33.6 % (ref 12.0–46.0)
MCHC: 34 g/dL (ref 30.0–36.0)
MCV: 93.2 fl (ref 78.0–100.0)
MONO ABS: 0.4 10*3/uL (ref 0.1–1.0)
MONOS PCT: 7.5 % (ref 3.0–12.0)
NEUTROS ABS: 3 10*3/uL (ref 1.4–7.7)
NEUTROS PCT: 52.7 % (ref 43.0–77.0)
PLATELETS: 193 10*3/uL (ref 150.0–400.0)
RBC: 4.87 Mil/uL (ref 4.22–5.81)
RDW: 13 % (ref 11.5–15.5)
WBC: 5.6 10*3/uL (ref 4.0–10.5)

## 2016-01-20 LAB — LIPID PANEL
CHOL/HDL RATIO: 2
Cholesterol: 159 mg/dL (ref 0–200)
HDL: 76.8 mg/dL (ref >39.00)
LDL Cholesterol: 76 mg/dL (ref 0–99)
NONHDL: 82.43
Triglycerides: 34 mg/dL (ref 0.0–149.0)
VLDL: 6.8 mg/dL (ref 0.0–40.0)

## 2016-01-20 LAB — TSH: TSH: 1.84 u[IU]/mL (ref 0.35–4.50)

## 2016-01-20 LAB — HEPATITIS C ANTIBODY: HCV Ab: NEGATIVE

## 2016-01-20 LAB — HEMOGLOBIN A1C: Hgb A1c MFr Bld: 5.3 % (ref 4.6–6.5)

## 2016-04-22 DIAGNOSIS — Z23 Encounter for immunization: Secondary | ICD-10-CM | POA: Diagnosis not present

## 2016-06-14 DIAGNOSIS — R350 Frequency of micturition: Secondary | ICD-10-CM | POA: Diagnosis not present

## 2016-06-14 DIAGNOSIS — N500 Atrophy of testis: Secondary | ICD-10-CM | POA: Diagnosis not present

## 2016-06-14 DIAGNOSIS — N401 Enlarged prostate with lower urinary tract symptoms: Secondary | ICD-10-CM | POA: Diagnosis not present

## 2016-07-27 DIAGNOSIS — K5730 Diverticulosis of large intestine without perforation or abscess without bleeding: Secondary | ICD-10-CM | POA: Diagnosis not present

## 2016-07-27 DIAGNOSIS — Z86010 Personal history of colonic polyps: Secondary | ICD-10-CM | POA: Diagnosis not present

## 2016-07-27 HISTORY — PX: OTHER SURGICAL HISTORY: SHX169

## 2016-07-27 LAB — HM COLONOSCOPY

## 2016-07-29 ENCOUNTER — Encounter: Payer: Self-pay | Admitting: Internal Medicine

## 2016-08-02 ENCOUNTER — Other Ambulatory Visit: Payer: Self-pay | Admitting: Internal Medicine

## 2016-08-02 DIAGNOSIS — F411 Generalized anxiety disorder: Secondary | ICD-10-CM

## 2016-08-02 NOTE — Telephone Encounter (Signed)
Rx faxed to [employer] Sales executive and [facility]).

## 2016-09-02 ENCOUNTER — Other Ambulatory Visit: Payer: Self-pay | Admitting: Urology

## 2016-09-02 NOTE — Progress Notes (Signed)
Talked to Darrel Reach at [facility] and informed her we need orders in [facility]. She will let Coni Mabe know as Coni scheduled him.

## 2016-09-05 ENCOUNTER — Other Ambulatory Visit (HOSPITAL_COMMUNITY): Payer: Self-pay | Admitting: *Deleted

## 2016-09-05 NOTE — Patient Instructions (Addendum)
Indra Okelley Vallo  09/05/2016   Your procedure is scheduled on: 09-09-16  Report to [facility] Main  Entrance take [facility]  elevators to 3rd floor to  [place] at 645  AM.  Call this number if you have problems the morning of surgery [phone]   Remember: ONLY 1 PERSON MAY GO WITH YOU TO SHORT STAY TO GET  READY MORNING OF [place].  Do not eat food or drink liquids :After Midnight.     Take these medicines the morning of surgery with A SIP OF WATER: ALPROZOLAM Duanne Moron),  IF NEEDED, HYDROCODNE IF NEEDED                               You may not have any metal on your body including hair pins and              piercings  Do not wear jewelry, make-up, lotions, powders or perfumes, deodorant             Do not wear nail polish.  Do not shave  48 hours prior to surgery.              Men may shave face and neck.   Do not bring valuables to the hospital. [place].  Contacts, dentures or bridgework may not be worn into surgery.  Leave suitcase in the car. After surgery it may be brought to your room.     Patients discharged the day of surgery will not be allowed to drive home.  Name and phone number of your driver: wige christine cell [phone]  Special Instructions: N/A              Please read over the following fact sheets you were given: _____________________________________________________________________             [facility] - Preparing for Surgery Before surgery, you can play an important role.  Because skin is not sterile, your skin needs to be as free of germs as possible.  You can reduce the number of germs on your skin by washing with CHG (chlorahexidine gluconate) soap before surgery.  CHG is an antiseptic cleaner which kills germs and bonds with the skin to continue killing germs even after washing. Please DO NOT use if you have an allergy to CHG or antibacterial soaps.  If your skin  becomes reddened/irritated stop using the CHG and inform your nurse when you arrive at Short Stay. Do not shave (including legs and underarms) for at least 48 hours prior to the first CHG shower.  You may shave your face/neck. Please follow these instructions carefully:  1.  Shower with CHG Soap the night before surgery and the  morning of Surgery.  2.  If you choose to wash your hair, wash your hair first as usual with your  normal  shampoo.  3.  After you shampoo, rinse your hair and body thoroughly to remove the  shampoo.                           4.  Use CHG as you would any other liquid soap.  You can apply chg directly  to the skin and wash  Gently with a scrungie or clean washcloth.  5.  Apply the CHG Soap to your body ONLY FROM THE NECK DOWN.   Do not use on face/ open                           Wound or open sores. Avoid contact with eyes, ears mouth and genitals (private parts).                       Wash face,  Genitals (private parts) with your normal soap.             6.  Wash thoroughly, paying special attention to the area where your surgery  will be performed.  7.  Thoroughly rinse your body with warm water from the neck down.  8.  DO NOT shower/wash with your normal soap after using and rinsing off  the CHG Soap.                9.  Pat yourself dry with a clean towel.            10.  Wear clean pajamas.            11.  Place clean sheets on your bed the night of your first shower and do not  sleep with pets. Day of Surgery : Do not apply any lotions/deodorants the morning of surgery.  Please wear clean clothes to the hospital/surgery center.  FAILURE TO FOLLOW THESE INSTRUCTIONS MAY RESULT IN THE CANCELLATION OF YOUR SURGERY PATIENT SIGNATURE_________________________________  NURSE SIGNATURE__________________________________  ________________________________________________________________________

## 2016-09-07 ENCOUNTER — Encounter (HOSPITAL_COMMUNITY): Payer: Self-pay

## 2016-09-07 ENCOUNTER — Encounter (HOSPITAL_COMMUNITY)
Admission: RE | Admit: 2016-09-07 | Discharge: 2016-09-07 | Disposition: A | Discharge status: Home / Self Care | Payer: Medicare Other | Source: Ambulatory Visit | Attending: Urology | Admitting: Urology

## 2016-09-07 DIAGNOSIS — R3915 Urgency of urination: Secondary | ICD-10-CM | POA: Diagnosis not present

## 2016-09-07 DIAGNOSIS — Z9889 Other specified postprocedural states: Secondary | ICD-10-CM | POA: Diagnosis not present

## 2016-09-07 DIAGNOSIS — R350 Frequency of micturition: Secondary | ICD-10-CM | POA: Diagnosis not present

## 2016-09-07 DIAGNOSIS — R351 Nocturia: Secondary | ICD-10-CM | POA: Diagnosis not present

## 2016-09-07 DIAGNOSIS — Z79899 Other long term (current) drug therapy: Secondary | ICD-10-CM | POA: Diagnosis not present

## 2016-09-07 DIAGNOSIS — N4341 Spermatocele of epididymis, single: Secondary | ICD-10-CM | POA: Diagnosis not present

## 2016-09-07 DIAGNOSIS — R3916 Straining to void: Secondary | ICD-10-CM | POA: Diagnosis not present

## 2016-09-07 DIAGNOSIS — Z8719 Personal history of other diseases of the digestive system: Secondary | ICD-10-CM | POA: Diagnosis not present

## 2016-09-07 DIAGNOSIS — Z79891 Long term (current) use of opiate analgesic: Secondary | ICD-10-CM | POA: Diagnosis not present

## 2016-09-07 DIAGNOSIS — R3912 Poor urinary stream: Secondary | ICD-10-CM | POA: Diagnosis not present

## 2016-09-07 DIAGNOSIS — Z87891 Personal history of nicotine dependence: Secondary | ICD-10-CM | POA: Diagnosis not present

## 2016-09-07 DIAGNOSIS — E559 Vitamin D deficiency, unspecified: Secondary | ICD-10-CM | POA: Diagnosis not present

## 2016-09-07 DIAGNOSIS — N5089 Other specified disorders of the male genital organs: Secondary | ICD-10-CM | POA: Diagnosis not present

## 2016-09-07 DIAGNOSIS — N401 Enlarged prostate with lower urinary tract symptoms: Secondary | ICD-10-CM | POA: Diagnosis not present

## 2016-09-07 DIAGNOSIS — N500 Atrophy of testis: Secondary | ICD-10-CM | POA: Diagnosis not present

## 2016-09-07 DIAGNOSIS — Z8619 Personal history of other infectious and parasitic diseases: Secondary | ICD-10-CM | POA: Diagnosis not present

## 2016-09-07 HISTORY — DX: Inflammatory liver disease, unspecified: K75.9

## 2016-09-07 HISTORY — DX: Pain in unspecified hip: M25.559

## 2016-09-07 HISTORY — DX: Cyst of epididymis: N50.3

## 2016-09-07 LAB — BASIC METABOLIC PANEL
ANION GAP: 5 (ref 5–15)
BUN: 11 mg/dL (ref 6–20)
CHLORIDE: 105 mmol/L (ref 101–111)
CO2: 29 mmol/L (ref 22–32)
Calcium: 9.4 mg/dL (ref 8.9–10.3)
Creatinine, Ser: 0.76 mg/dL (ref 0.61–1.24)
GFR calc Af Amer: >60 mL/min (ref >60)
GFR calc non Af Amer: >60 mL/min (ref >60)
GLUCOSE: 90 mg/dL (ref 65–99)
POTASSIUM: 4 mmol/L (ref 3.5–5.1)
Sodium: 139 mmol/L (ref 135–145)

## 2016-09-07 LAB — CBC
HEMATOCRIT: 42.6 % (ref 39.0–52.0)
HEMOGLOBIN: 15.1 g/dL (ref 13.0–17.0)
MCH: 31.6 pg (ref 26.0–34.0)
MCHC: 35.4 g/dL (ref 30.0–36.0)
MCV: 89.1 fL (ref 78.0–100.0)
Platelets: 222 10*3/uL (ref 150–400)
RBC: 4.78 MIL/uL (ref 4.22–5.81)
RDW: 12.7 % (ref 11.5–15.5)
WBC: 5.9 10*3/uL (ref 4.0–10.5)

## 2016-09-08 NOTE — Progress Notes (Signed)
Spoke with pt by phone aware surgery time changed to 930 am arrive 730 am 2--2-18 [facility] short stay

## 2016-09-09 ENCOUNTER — Ambulatory Visit (HOSPITAL_COMMUNITY): Payer: Medicare Other | Admitting: Anesthesiology

## 2016-09-09 ENCOUNTER — Ambulatory Visit (HOSPITAL_COMMUNITY)
Admission: RE | Admit: 2016-09-09 | Discharge: 2016-09-09 | Disposition: A | Discharge status: Home / Self Care | Payer: Medicare Other | Source: Ambulatory Visit | Attending: Urology | Admitting: Urology

## 2016-09-09 ENCOUNTER — Encounter (HOSPITAL_COMMUNITY): Payer: Self-pay | Admitting: *Deleted

## 2016-09-09 ENCOUNTER — Encounter (HOSPITAL_COMMUNITY)
Admission: RE | Disposition: A | Discharge status: Home / Self Care | Payer: Self-pay | Source: Ambulatory Visit | Attending: Urology

## 2016-09-09 DIAGNOSIS — R3912 Poor urinary stream: Secondary | ICD-10-CM | POA: Diagnosis not present

## 2016-09-09 DIAGNOSIS — N401 Enlarged prostate with lower urinary tract symptoms: Secondary | ICD-10-CM | POA: Insufficient documentation

## 2016-09-09 DIAGNOSIS — Z9889 Other specified postprocedural states: Secondary | ICD-10-CM | POA: Diagnosis not present

## 2016-09-09 DIAGNOSIS — E559 Vitamin D deficiency, unspecified: Secondary | ICD-10-CM | POA: Insufficient documentation

## 2016-09-09 DIAGNOSIS — N5089 Other specified disorders of the male genital organs: Secondary | ICD-10-CM | POA: Insufficient documentation

## 2016-09-09 DIAGNOSIS — R3916 Straining to void: Secondary | ICD-10-CM | POA: Diagnosis not present

## 2016-09-09 DIAGNOSIS — Z79891 Long term (current) use of opiate analgesic: Secondary | ICD-10-CM | POA: Diagnosis not present

## 2016-09-09 DIAGNOSIS — N4341 Spermatocele of epididymis, single: Secondary | ICD-10-CM | POA: Diagnosis not present

## 2016-09-09 DIAGNOSIS — Z8619 Personal history of other infectious and parasitic diseases: Secondary | ICD-10-CM | POA: Insufficient documentation

## 2016-09-09 DIAGNOSIS — N500 Atrophy of testis: Secondary | ICD-10-CM | POA: Insufficient documentation

## 2016-09-09 DIAGNOSIS — Z8719 Personal history of other diseases of the digestive system: Secondary | ICD-10-CM | POA: Insufficient documentation

## 2016-09-09 DIAGNOSIS — Z87891 Personal history of nicotine dependence: Secondary | ICD-10-CM | POA: Insufficient documentation

## 2016-09-09 DIAGNOSIS — R351 Nocturia: Secondary | ICD-10-CM | POA: Diagnosis not present

## 2016-09-09 DIAGNOSIS — R739 Hyperglycemia, unspecified: Secondary | ICD-10-CM | POA: Diagnosis not present

## 2016-09-09 DIAGNOSIS — R3915 Urgency of urination: Secondary | ICD-10-CM | POA: Insufficient documentation

## 2016-09-09 DIAGNOSIS — Z79899 Other long term (current) drug therapy: Secondary | ICD-10-CM | POA: Diagnosis not present

## 2016-09-09 DIAGNOSIS — J309 Allergic rhinitis, unspecified: Secondary | ICD-10-CM | POA: Diagnosis not present

## 2016-09-09 DIAGNOSIS — N503 Cyst of epididymis: Secondary | ICD-10-CM | POA: Diagnosis not present

## 2016-09-09 DIAGNOSIS — N4342 Spermatocele of epididymis, multiple: Secondary | ICD-10-CM | POA: Diagnosis not present

## 2016-09-09 DIAGNOSIS — R350 Frequency of micturition: Secondary | ICD-10-CM | POA: Insufficient documentation

## 2016-09-09 DIAGNOSIS — K219 Gastro-esophageal reflux disease without esophagitis: Secondary | ICD-10-CM | POA: Diagnosis not present

## 2016-09-09 HISTORY — PX: EPIDIDYMECTOMY: SHX6275

## 2016-09-09 SURGERY — EPIDIDYMECTOMY
Anesthesia: General | Laterality: Right

## 2016-09-09 MED ORDER — PROPOFOL 10 MG/ML IV BOLUS
INTRAVENOUS | Status: AC
  Filled 2016-09-09: qty 40

## 2016-09-09 MED ORDER — PROPOFOL 10 MG/ML IV BOLUS
INTRAVENOUS | Status: DC | PRN
  Administered 2016-09-09: 200 mg via INTRAVENOUS

## 2016-09-09 MED ORDER — MIDAZOLAM HCL 2 MG/2ML IJ SOLN
INTRAMUSCULAR | Status: AC
  Filled 2016-09-09: qty 2

## 2016-09-09 MED ORDER — ONDANSETRON HCL 4 MG/2ML IJ SOLN
INTRAMUSCULAR | Status: DC | PRN
  Administered 2016-09-09: 4 mg via INTRAVENOUS

## 2016-09-09 MED ORDER — FENTANYL CITRATE (PF) 100 MCG/2ML IJ SOLN
INTRAMUSCULAR | Status: DC | PRN
  Administered 2016-09-09 (×2): 50 ug via INTRAVENOUS

## 2016-09-09 MED ORDER — FENTANYL CITRATE (PF) 100 MCG/2ML IJ SOLN: 25.0000 ug | INTRAMUSCULAR | Status: DC | PRN

## 2016-09-09 MED ORDER — OXYCODONE HCL 5 MG PO TABS: 5.0000 mg | ORAL_TABLET | Freq: Once | ORAL | Status: DC | PRN

## 2016-09-09 MED ORDER — LIDOCAINE 2% (20 MG/ML) 5 ML SYRINGE
INTRAMUSCULAR | Status: DC | PRN
  Administered 2016-09-09: 60 mg via INTRAVENOUS

## 2016-09-09 MED ORDER — CEFAZOLIN SODIUM-DEXTROSE 2-4 GM/100ML-% IV SOLN
2.0000 g | INTRAVENOUS | Status: AC
  Administered 2016-09-09: 2 g via INTRAVENOUS
  Filled 2016-09-09: qty 100

## 2016-09-09 MED ORDER — 0.9 % SODIUM CHLORIDE (POUR BTL) OPTIME
TOPICAL | Status: DC | PRN
  Administered 2016-09-09: 1000 mL

## 2016-09-09 MED ORDER — GLYCOPYRROLATE 0.2 MG/ML IV SOSY
PREFILLED_SYRINGE | INTRAVENOUS | Status: AC
  Filled 2016-09-09: qty 3

## 2016-09-09 MED ORDER — DEXAMETHASONE SODIUM PHOSPHATE 4 MG/ML IJ SOLN
INTRAMUSCULAR | Status: DC | PRN
  Administered 2016-09-09: 10 mg via INTRAVENOUS

## 2016-09-09 MED ORDER — EPHEDRINE SULFATE-NACL 50-0.9 MG/10ML-% IV SOSY
PREFILLED_SYRINGE | INTRAVENOUS | Status: DC | PRN
  Administered 2016-09-09 (×3): 10 mg via INTRAVENOUS

## 2016-09-09 MED ORDER — BUPIVACAINE HCL (PF) 0.25 % IJ SOLN
INTRAMUSCULAR | Status: AC
  Filled 2016-09-09: qty 30

## 2016-09-09 MED ORDER — PROMETHAZINE HCL 25 MG/ML IJ SOLN: 6.2500 mg | INTRAMUSCULAR | Status: DC | PRN

## 2016-09-09 MED ORDER — MIDAZOLAM HCL 5 MG/5ML IJ SOLN
INTRAMUSCULAR | Status: DC | PRN
  Administered 2016-09-09: 2 mg via INTRAVENOUS

## 2016-09-09 MED ORDER — BUPIVACAINE HCL (PF) 0.25 % IJ SOLN
INTRAMUSCULAR | Status: DC | PRN
  Administered 2016-09-09: 30 mL

## 2016-09-09 MED ORDER — DEXAMETHASONE SODIUM PHOSPHATE 10 MG/ML IJ SOLN
INTRAMUSCULAR | Status: AC
  Filled 2016-09-09: qty 1

## 2016-09-09 MED ORDER — ACETAMINOPHEN 500 MG PO TABS
1000.0000 mg | ORAL_TABLET | ORAL | Status: AC
  Administered 2016-09-09: 1000 mg via ORAL
  Filled 2016-09-09: qty 2

## 2016-09-09 MED ORDER — LACTATED RINGERS IV SOLN
INTRAVENOUS | Status: DC
  Administered 2016-09-09 (×2): via INTRAVENOUS

## 2016-09-09 MED ORDER — KETOROLAC TROMETHAMINE 30 MG/ML IJ SOLN
INTRAMUSCULAR | Status: AC
  Filled 2016-09-09: qty 1

## 2016-09-09 MED ORDER — OXYCODONE HCL 5 MG/5ML PO SOLN
5.0000 mg | Freq: Once | ORAL | Status: DC | PRN
  Filled 2016-09-09: qty 5

## 2016-09-09 MED ORDER — KETOROLAC TROMETHAMINE 30 MG/ML IJ SOLN
30.0000 mg | INTRAMUSCULAR | Status: DC
  Administered 2016-09-09: 15 mg via INTRAVENOUS
  Filled 2016-09-09: qty 1

## 2016-09-09 MED ORDER — FENTANYL CITRATE (PF) 100 MCG/2ML IJ SOLN
INTRAMUSCULAR | Status: AC
  Filled 2016-09-09: qty 2

## 2016-09-09 MED ORDER — GLYCOPYRROLATE 0.2 MG/ML IV SOSY
PREFILLED_SYRINGE | INTRAVENOUS | Status: DC | PRN
  Administered 2016-09-09: .2 mg via INTRAVENOUS

## 2016-09-09 MED ORDER — ONDANSETRON HCL 4 MG/2ML IJ SOLN
INTRAMUSCULAR | Status: AC
  Filled 2016-09-09: qty 2

## 2016-09-09 MED ORDER — LIDOCAINE 2% (20 MG/ML) 5 ML SYRINGE
INTRAMUSCULAR | Status: AC
  Filled 2016-09-09: qty 5

## 2016-09-09 MED ORDER — EPHEDRINE 5 MG/ML INJ
INTRAVENOUS | Status: AC
  Filled 2016-09-09: qty 10

## 2016-09-09 SURGICAL SUPPLY — 42 items
BLADE HEX COATED 2.75 (ELECTRODE) ×2 IMPLANT
COVER SURGICAL LIGHT HANDLE (MISCELLANEOUS) ×2 IMPLANT
DECANTER SPIKE VIAL GLASS SM (MISCELLANEOUS) IMPLANT
DISSECTOR ROUND CHERRY 3/8 STR (MISCELLANEOUS) ×2 IMPLANT
DRAIN PENROSE 18X1/4 LTX STRL (WOUND CARE) ×2 IMPLANT
DRAPE UTILITY 15X26 (DRAPE) ×2 IMPLANT
ELECT REM PT RETURN 9FT ADLT (ELECTROSURGICAL) ×2
ELECTRODE REM PT RTRN 9FT ADLT (ELECTROSURGICAL) ×1 IMPLANT
GAUZE SPONGE 4X4 16PLY XRAY LF (GAUZE/BANDAGES/DRESSINGS) ×2 IMPLANT
GLOVE BIOGEL M STRL SZ7.5 (GLOVE) ×2 IMPLANT
GOWN STRL REUS W/TWL XL LVL3 (GOWN DISPOSABLE) ×2 IMPLANT
KIT BASIN OR (CUSTOM PROCEDURE TRAY) ×2 IMPLANT
LIQUID BAND (GAUZE/BANDAGES/DRESSINGS) ×2 IMPLANT
LOOP VESSEL MAXI BLUE (MISCELLANEOUS) ×2 IMPLANT
NEEDLE HYPO 22GX1.5 SAFETY (NEEDLE) ×1 IMPLANT
NS IRRIG 1000ML POUR BTL (IV SOLUTION) IMPLANT
PACK GENERAL/GYN (CUSTOM PROCEDURE TRAY) ×2 IMPLANT
SCRUB TECHNI CARE 4 OZ NO DYE (MISCELLANEOUS) ×1 IMPLANT
SUPPORT SCROTAL LG STRP (MISCELLANEOUS) ×2 IMPLANT
SUT ETHIBOND 3-0 V-5 (SUTURE) ×2 IMPLANT
SUT ETHILON 0 48 LOOP BLK (SUTURE) ×2 IMPLANT
SUT MON AB 5-0 PS2 18 (SUTURE) IMPLANT
SUT SILK 2 0 (SUTURE)
SUT SILK 2-0 18XBRD TIE 12 (SUTURE) IMPLANT
SUT SILK 3 0 (SUTURE)
SUT SILK 3-0 18XBRD TIE 12 (SUTURE) IMPLANT
SUT VIC AB 3-0 SH 27 (SUTURE) ×2
SUT VIC AB 3-0 SH 27X BRD (SUTURE) ×1 IMPLANT
SUT VIC AB 4-0 PS2 27 (SUTURE) ×2 IMPLANT
SUT VIC AB 4-0 RB1 27 (SUTURE)
SUT VIC AB 4-0 RB1 27XBRD (SUTURE) IMPLANT
SUT VIC AB 4-0 SH 27 (SUTURE)
SUT VIC AB 4-0 SH 27XBRD (SUTURE) IMPLANT
SUT VICRYL 0 TIES 12 18 (SUTURE) ×2 IMPLANT
SUT VICRYL 2 0 18  UND BR (SUTURE)
SUT VICRYL 2 0 18 UND BR (SUTURE) IMPLANT
SUT VICRYL 3 0 BR 18  UND (SUTURE) ×1
SUT VICRYL 3 0 BR 18 UND (SUTURE) ×1 IMPLANT
SYR 20CC LL (SYRINGE) IMPLANT
SYR CONTROL 10ML LL (SYRINGE) ×1 IMPLANT
TOWEL OR NON WOVEN STRL DISP B (DISPOSABLE) ×2 IMPLANT
WATER STERILE IRR 1500ML POUR (IV SOLUTION) IMPLANT

## 2016-09-09 NOTE — Anesthesia Preprocedure Evaluation (Signed)
Anesthesia Evaluation  Patient identified by MRN, date of birth, ID band Patient awake    Reviewed: Allergy & Precautions, NPO status , Patient's Chart, lab work & pertinent test results  Airway Mallampati: II  TM Distance: >3 FB Neck ROM: Full    Dental no notable dental hx.    Pulmonary neg pulmonary ROS, former smoker,    Pulmonary exam normal breath sounds clear to auscultation       Cardiovascular hypertension, Normal cardiovascular exam Rhythm:Regular Rate:Normal  No meds, not part of diagnostic profile   Neuro/Psych negative neurological ROS  negative psych ROS   GI/Hepatic Neg liver ROS, GERD  ,  Endo/Other  negative endocrine ROS  Renal/GU negative Renal ROS  negative genitourinary   Musculoskeletal negative musculoskeletal ROS (+)   Abdominal   Peds negative pediatric ROS (+)  Hematology negative hematology ROS (+)   Anesthesia Other Findings   Reproductive/Obstetrics negative OB ROS                             Anesthesia Physical Anesthesia Plan  ASA: II  Anesthesia Plan: General   Post-op Pain Management:    Induction: Intravenous  Airway Management Planned: LMA  Additional Equipment:   Intra-op Plan:   Post-operative Plan: Extubation in OR  Informed Consent: I have reviewed the patients History and Physical, chart, labs and discussed the procedure including the risks, benefits and alternatives for the proposed anesthesia with the patient or authorized representative who has indicated his/her understanding and acceptance.   Dental advisory given  Plan Discussed with: CRNA and Surgeon  Anesthesia Plan Comments:         Anesthesia Quick Evaluation

## 2016-09-09 NOTE — Transfer of Care (Signed)
Immediate Anesthesia Transfer of Care Note  Patient: Mark Cohen  Procedure(s) Performed: Procedure(s): EPIDIDYMECTOMY (Right)  Patient Location: PACU  Anesthesia Type:General  Level of Consciousness: awake, alert  and oriented  Airway & Oxygen Therapy: Bs with O2 per face mask  Post-op Assessment: Report given to RN  Post vital signs: Reviewed and stable  Last Vitals: 129/93, 88, 17, 100% Vitals:   09/09/16 0737  BP: (!) 142/94  Pulse: 78  Resp: 18  Temp: 36.6 C    Last Pain:  Vitals:   09/09/16 0751  TempSrc:   PainSc: 2       Patients Stated Pain Goal: 3 ([id number])  Complications: No apparent anesthesia complications

## 2016-09-09 NOTE — Discharge Instructions (Signed)
General Anesthesia, Adult, Care After These instructions provide you with information about caring for yourself after your procedure. Your health care provider may also give you more specific instructions. Your treatment has been planned according to current medical practices, but problems sometimes occur. Call your health care provider if you have any problems or questions after your procedure. What can I expect after the procedure? After the procedure, it is common to have:  Vomiting.  A sore throat.  Mental slowness. It is common to feel:  Nauseous.  Cold or shivery.  Sleepy.  Tired.  Sore or achy, even in parts of your body where you did not have surgery. Follow these instructions at home: For at least 24 hours after the procedure:   Do not:  Participate in activities where you could fall or become injured.  Drive.  Use heavy machinery.  Drink alcohol.  Take sleeping pills or medicines that cause drowsiness.  Make important decisions or sign legal documents.  Take care of children on your own.  Rest. Eating and drinking   If you vomit, drink water, juice, or soup when you can drink without vomiting.  Drink enough fluid to keep your urine clear or pale yellow.  Make sure you have little or no nausea before eating solid foods.  Follow the diet recommended by your health care provider. General instructions   Have a responsible adult stay with you until you are awake and alert.  Return to your normal activities as told by your health care provider. Ask your health care provider what activities are safe for you.  Take over-the-counter and prescription medicines only as told by your health care provider.  If you smoke, do not smoke without supervision.  Keep all follow-up visits as told by your health care provider. This is important. Contact a health care provider if:  You continue to have nausea or vomiting at home, and medicines are not helpful.  You  cannot drink fluids or start eating again.  You cannot urinate after 8-12 hours.  You develop a skin rash.  You have fever.  You have increasing redness at the site of your procedure. Get help right away if:  You have difficulty breathing.  You have chest pain.  You have unexpected bleeding.  You feel that you are having a life-threatening or urgent problem. This information is not intended to replace advice given to you by your health care provider. Make sure you discuss any questions you have with your health care provider. Document Released: 10/03/2000 Document Revised: 11/30/2015 Document Reviewed: 06/11/2015 Elsevier Interactive Patient Education  2017 [place], Adult, Care After This sheet gives you information about how to care for yourself after your procedure. Your health care provider may also give you more specific instructions. If you have problems or questions, contact your health care provider. What can I expect after the procedure? After your procedure, it is common to have mild discomfort, swelling, and bruising in the pouch that holds your testicles (scrotum). Follow these instructions at home: Bathing   Ask your health care provider when you can shower, take baths, or go swimming.  If you were told to wear an athletic support strap, take it off when you shower or take a bath. Incision care   Follow instructions from your health care provider about how to take care of your incision. Make sure you:  Wash your hands with soap and water before you change your bandage (dressing). If soap  and water are not available, use hand sanitizer.  Change your dressing as told by your health care provider.  Leave stitches (sutures) in place.  Check your incision and scrotum every day for signs of infection. Check for:  More redness, swelling, or pain.  Blood or fluid.  Warmth.  Pus or a bad smell. Managing pain, stiffness, and  swelling   If directed, apply ice to the injured area:  Put ice in a plastic bag.  Place a towel between your skin and the bag.  Leave the ice on for 20 minutes, 2-3 times per day. Driving   Do not drive for 24 hours if you were given a sedative.  Do not drive or use heavy machinery while taking prescription pain medicine.  Ask your health care provider when it is safe to drive. Activity   Do not do any activities that require great strength and energy (are vigorous) for as long as told by your health care provider.  Return to your normal activities as told by your health care provider. Ask your health care provider what activities are safe for you.  Do not lift anything that is heavier than 10 lb (4.5 kg) until your health care provider says that it is safe. General instructions   Take over-the-counter and prescription medicines only as told by your health care provider.  Keep all follow-up visits as told by your health care provider. This is important.  If you were given an athletic support strap, wear it as told by your health care provider.  If you had a drain put in during the procedure, you will need to return for a follow-up visit to have it removed. Contact a health care provider if:  Your pain is not controlled with medicine.  You have more redness or swelling around your scrotum.  You have blood or fluid coming from your scrotum.  Your incision feels warm to the touch.  You have pus or a bad smell coming from your scrotum.  You have a fever. This information is not intended to replace advice given to you by your health care provider. Make sure you discuss any questions you have with your health care provider. Document Released: 03/18/2015 Document Revised: 03/26/2016 Document Reviewed: 03/26/2016 Elsevier Interactive Patient Education  2017 [employer].

## 2016-09-09 NOTE — Anesthesia Procedure Notes (Signed)
Procedure Name: LMA Insertion Date/Time: 09/09/2016 9:52 AM Performed by: Bethena Roys T Pre-anesthesia Checklist: Patient identified, Emergency Drugs available, Suction available and Patient being monitored Patient Re-evaluated:Patient Re-evaluated prior to inductionOxygen Delivery Method: Circle system utilized Preoxygenation: Pre-oxygenation with 100% oxygen Intubation Type: IV induction Ventilation: Mask ventilation without difficulty LMA: LMA inserted LMA Size: 5.0 Number of attempts: 1 Airway Equipment and Method: Bite block Placement Confirmation: positive ETCO2 Tube secured with: Tape Dental Injury: Teeth and Oropharynx as per pre-operative assessment

## 2016-09-09 NOTE — Op Note (Signed)
Preoperative Diagnosis: Right complex spermatocele  Postoperative Diagnosis:  Right complex spermatocele  Procedure(s) Performed:  1. Right spermatocelectomy 2. Right orchidopexy  Teaching Surgeon:  Carolan Clines, MD  Resident Surgeon:  Sharmaine Base, MD  Assistant(s):  None  Anesthesia:  General  Fluids:  See anesthesia record  Estimated blood loss:  10 mL  Specimens:  Right sided complex epididymal cyst  Cultures:  None  Drains:  None  Complications:  None  Indications: This is a 66yo male with a history of right spermatocele. After discussion in the clinic setting the patient has agreed to proceed with spermatocelectomy. The risks of infection, testicular and spermatic cord injury, testicular atrophy and vascular compromise, recurrence of spermatocele, hematoma/bleeding were discussed and the patient wishes to proceed.  Findings:  Right large complex spermatocele appearing to rise off the head of the right epididymis. 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 Care taken to previous as much spermatic cord structures as allowed during dissection, although there was some fibrosis and intricate weaving of cord structures around complex spermatocele. Epididymal head transected from right testicle.  Description:   The patient was correctly identified in the preop holding area where written informed consent as well potential risk and complication reviewed. He agreed. He was brought back to the operative suite. Once correct information was verified, general anesthesia was induced. They were then gently placed into dorsal lithotomy position with SCDs in place for VTE prophylaxis. They were prepped and draped in the usual sterile fashion and given appropriate preoperative antibiotics. A second timeout was then performed.   A 4cm right transverse superior scrotal incision was made. Using Bovie electrocautery, we dissected through the dartos layer until the  spermatocele and testicle were delivered. The tunica vaginalis was opened with care to further expose the testicle, spermatocele, and epididymis. The spermatocele was quite complex in nature with loculations that likely arose from spermatic cord structures essentially coursing around the spermatocele. These were carefully dissected off the spermatocele.   Careful dissection was carried out with Bovie electrocautery in order to avoid entering the spermatocele. A combination of sharp and blunt dissection was used to isolate the spermatocele off of the testis and epididymis. A broad neck of the spermatocele was eventually identified and dissected free off of the head of the epididymis, then suture ligated with 3-0 vicryl prior to division with Bovie electrocautery. The head of the epididymis was transected free from the right atrophic testicle during our dissection due to the complex nature of the spermatocele.   The spermatocele was freed and sent for pathology. Hemostasis was attained with Bovie electrocautery. We elected not to leave a surgical drain due to excellent hemostasis.  The testicle was secured in place using a triangulation technique for pexy with 3-0 ethibond. Care was taken to ensure that the testicle was in the normal anatomic lie. There was no lateral sulcus identifiable on the testicle. The vas deferens was clearly demarcated and followed all of the way to the tail of the epididymis. The tail of the epididymis was located posteriorly & laterally.  Local anesthesia with 0.25% marcaine was instilled in the right spermatic cord for a nerve block. The dartos was closed with 3-0 vicryl suture in running fashion. Local anesthesia was also instilled into the scrotal pouch prior to dartos closure. The scrotal skin edges were then reapproximated in horizontal mattress fashion with 4-0 monocryl suture. Dermabond was applied to the incision site and then dressed with dry fluff gauze and scrotal support.  The patient was woken up from anesthesia and taken to the recovery unit for routine postoperative care.    Post Op Plan:   1. Discharge patient home when meets PACU criteria. 2. D/c with Rx's for oxycodone & stool softeners 3. Follow up in 1 week with APP for postop check, 6 weeks with Dr. Gaynelle Arabian  Attestation:  Dr. Gaynelle Arabian was present and scrubbed for the entirety of the procedure.    Sharmaine Base, MD Resident, Department of Urology

## 2016-09-09 NOTE — Anesthesia Postprocedure Evaluation (Signed)
Anesthesia Post Note  Patient: Mark Cohen  Procedure(s) Performed: Procedure(s) (LRB): EPIDIDYMECTOMY (Right)  Patient location during evaluation: PACU Anesthesia Type: General Level of consciousness: awake and alert Pain management: pain level controlled Vital Signs Assessment: post-procedure vital signs reviewed and stable Respiratory status: spontaneous breathing, nonlabored ventilation, respiratory function stable and patient connected to nasal cannula oxygen Cardiovascular status: blood pressure returned to baseline and stable Postop Assessment: no signs of nausea or vomiting Anesthetic complications: no       Last Vitals:  Vitals:   09/09/16 1245 09/09/16 1258  BP: (!) 129/91 133/82  Pulse: 68 71  Resp:  16  Temp: 36.5 C 36.3 C    Last Pain:  Vitals:   09/09/16 1215  TempSrc:   PainSc: 0-No pain                 Azriella Mattia S

## 2016-09-09 NOTE — H&P (Signed)
Office Visit Report     06/14/2016   --------------------------------------------------------------------------------   Mark Cohen  MRN: [id number]  PRIMARY CARE:  Janith Lima, MD  DOB: [birth year], 66 year old Male  REFERRING:    SSN:   PROVIDER:  Carolan Clines, M.D.    LOCATION:  [facility] [phone]   --------------------------------------------------------------------------------   CC: BPH  HPI: Mark Cohen is a 66 year-old male established patient who is here for follow up regarding further evaluation of BPH and lower urinary tract symptoms.  The patient complains of lower urinary tract symptom(s) that include frequency, urgency, weak stream, intermittency, straining, and nocturia. The patient states if he were to spend the rest of his life with his current urinary condition, he would be delighted.     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  HPI: He noticed his condition approximately 06/10/2012. The problem is on the left side. He is having pain.   When he lifts, strains, or coughs he does not have pain on the same side as his atrophic testis. His condition does cause restriction of normal activities.   He has had nausea or vomiting related to his condition. He has not had a prior atrophic testis surgery on the same side as the present one.   patient returns today to discuss possible surgery for his large right sided epididymal cyst. He is somewhat improved since he is off his lawnmower, but does have pain sometimes at night, when he lays on his left side. The patient and his wife believe perhaps he should hold off on surgical intervention at this time, unless his pain becomes worse. 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     AUA Symptom Score: He never has the sensation of not emptying his bladder completely after finishing urinating. 50% of the time he has to urinate again fewer than two hours after he has finished urinating. 50% of the time he  has to start and stop again several times when he urinates. Less than 20% of the time he finds it difficult to postpone urination. Less than 20% of the time he has a weak urinary stream. Less than 50% of the time he has to push or strain to begin urination. He has to get up to urinate 2 times from the time he goes to bed until the time he gets up in the morning.   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/23/2015 Disorder of male genital organs, unspecified, Testis mass - 09/23/2015 BPH w/LUTS, Benign localized hyperplasia of prostate with urinary obstruction - 2016      PMH Notes:  2015-10-05 16:58:54 - Note: Epididymis Mass (___cm)   NON-GU PMH: Other lack of coordination, Muscular incoordination - 10/01/2015 Other muscle spasm, Muscle spasm - 10/01/2015 Encounter for general adult medical examination without abnormal findings, Encounter for preventive health examination - 09/23/2015 Vitamin D deficiency, unspecified, Vitamin D deficiency - 2016 Hypoactive sexual desire disorder, Hypoactive sexual desire disorder - 2015 Personal history of other diseases of the digestive system, History  of esophageal reflux - 2012-11-03 Personal history of other infectious and parasitic diseases, History of hepatitis - November 03, 2012, History of hepatitis, - 11-03-2012 Personal history of other specified conditions, History of heartburn - 2012/11/03    FAMILY HISTORY: Death In The Family Father - Father Death In The Family Mother - Mother   SOCIAL HISTORY: Marital Status: Married Current Smoking Status: Patient does not smoke anymore. Smoked  for 10 years.  Drinks 1 drink per day.  Drinks 2 caffeinated drinks per day. Patient's occupation is/was Retired.    REVIEW OF SYSTEMS:    GU Review Male:   Patient reports get up at night to urinate and stream starts and stops. Patient denies frequent urination, hard to postpone urination, burning/ pain with urination, leakage of urine, trouble starting your stream, have to strain to urinate , erection problems, and penile pain.  Gastrointestinal (Upper):   Patient denies nausea, vomiting, and indigestion/ heartburn.  Gastrointestinal (Lower):   Patient denies diarrhea and constipation.  Constitutional:   Patient denies fever, night sweats, weight loss, and fatigue.  Skin:   Patient denies skin rash/ lesion and itching.  Eyes:   Patient denies blurred vision and double vision.  Ears/ Nose/ Throat:   Patient denies sore throat and sinus problems.  Hematologic/Lymphatic:   Patient denies swollen glands and easy bruising.  Cardiovascular:   Patient denies leg swelling and chest pains.  Respiratory:   Patient denies cough and shortness of breath.  Endocrine:   Patient denies excessive thirst.  Musculoskeletal:   Patient denies back pain and joint pain.  Neurological:   Patient denies headaches and dizziness.  Psychologic:   Patient denies depression and anxiety.   VITAL SIGNS:      06/14/2016 01:54 PM  BP 141/88 mmHg  Pulse 64 /min  Temperature 97.9 F / 37 C   GU PHYSICAL EXAMINATION:    Anus and Perineum: No hemorrhoids. No anal stenosis. No rectal fissure, no anal fissure. No edema, no dimple, no perineal tenderness, no anal tenderness.  Scrotum: No lesions. No edema. No cysts. No warts.  Epididymides: Right: right head contains 3+ cm mass, right body contains 3+ cm mass, right tail contains 3+ cm mass, right head contains a cyst, right body contains a cyst, right tail contains a cyst. Right: No spermatocele, no tenderness, no induration, no enlargement. Left: No spermatocele, no  masses, no cysts, no tenderness, no induration, no enlargement.   Testes: Atrophic left testis. No tenderness, no swelling, no enlargement left testis. No tenderness, no swelling, no enlargement right testis. Normal location left testis. Normal location right testis. No mass, no cyst, no varicocele, no hydrocele left testis. No mass, no cyst, no varicocele, no hydrocele right testis.   Urethral Meatus: Normal size. No lesion, no wart, no discharge, no polyp. Normal location.  Penis: Circumcised, no warts, no cracks. No dorsal Peyronie's plaques, no left corporal Peyronie's plaques, no right corporal Peyronie's plaques, no scarring, no warts. No balanitis, no meatal stenosis.  Prostate: 40 gram or 2+ size. Left lobe normal consistency, right lobe normal consistency. Symmetrical lobes. No prostate nodule. Left lobe no tenderness, right lobe no tenderness.  Seminal Vesicles: Nonpalpable.  Sphincter Tone: Normal sphincter. No rectal tenderness. No rectal mass.    MULTI-SYSTEM PHYSICAL EXAMINATION:    Constitutional: Well-nourished. No physical deformities. Normally developed. Good grooming.  Neck: Neck symmetrical, not swollen. Normal tracheal position.  Respiratory: No labored breathing, no use of accessory muscles.   Cardiovascular: Normal temperature, normal extremity pulses, no  swelling, no varicosities.  Lymphatic: No enlargement of neck, axillae, groin.  Skin: No paleness, no jaundice, no cyanosis. No lesion, no ulcer, no rash.  Neurologic / Psychiatric: Oriented to time, oriented to place, oriented to person. No depression, no anxiety, no agitation.  Gastrointestinal: No mass, no tenderness, no rigidity, non obese abdomen.  Eyes: Normal conjunctivae. Normal eyelids.  Ears, Nose, Mouth, and Throat: Left ear no scars, no lesions, no masses. Right ear no scars, no lesions, no masses. Nose no scars, no lesions, no masses. Normal hearing. Normal lips.  Musculoskeletal: Normal gait and station of  head and neck.     PAST DATA REVIEWED:  Source Of History:  Patient  Records Review:   AUA Symptom Score, Previous Patient Records   11/20/14 11/27/13 11/20/12 11/17/11 10/26/10 04/22/09  PSA  Total PSA 0.57  0.49  0.41  0.38  0.37  0.53   Free PSA 0.17        % Free PSA 30          09/24/15 11/15/14 11/26/13 11/20/12 11/17/11 10/26/10 04/22/09 11/06/06  Hormones  Testosterone, Total 521  527  649  529  601.88  446.17  380.0  4.94     06/14/16 09/23/15 11/19/14 06/10/14 12/03/13 11/26/12 11/22/11 11/17/10  Urinalysis  Urine Appearance Clear          Urine Color Yellow          Urine Glucose Neg          Urine Bilirubin Neg          Urine Ketones Neg          Urine Specific Gravity 1.015          Urine Blood Neg          Urine pH 5.5  5.5  5.5  6.0  6.0  6.0  6.0  5.5   Urine Protein Neg  NEGATIVE  NEG  NEG  NEG  NEG  NEG  NEG   Urine Urobilinogen 0.2   0.2  0.2  0.2  0.2  0.2  0.2   Urine Nitrites Neg  NEGATIVE  NEG  NEG  NEG  NEG  NEG  NEG   Urine Leukocyte Esterase Neg  NEGATIVE  NEG  NEG  NEG  NEG  NEG  NEG   Urine WBC/hpf     0-2      Urine RBC/hpf     NONE SEEN       PROCEDURES:          Urinalysis Dipstick Dipstick Cont'd  Color: Yellow Bilirubin: Neg  Appearance: Clear Ketones: Neg  Specific Gravity: 1.015 Blood: Neg  pH: 5.5 Protein: Neg  Glucose: Neg Urobilinogen: 0.2    Nitrites: Neg    Leukocyte Esterase: Neg    ASSESSMENT:      ICD-10 Details  1 GU:   BPH w/LUTS - N40.1   2   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 He has a large right epididymal cyst. It no longer causes pain, as the patient desired to hold off on surgical intervention as long as possible.    The plan has been to have him return to see Dr.Ottelin in 1 year for follow-up evaluation. He will have ultrasound again at  that  time.   However, he has called back and asked to have surgery b/c of more pain.   The patient continues to void well, with a history of BPH, with  an international prostate symptoms= 12.Marland Kitchen    PLAN:           Orders         Schedule X-Rays: 1 Year - Scrotal Ultrasound  Return Visit: 1 Year - Office Visit, Scrotal Ultrasound             Note: Dr. Karsten Ro          Document Letter(s):  Created for Patient: Clinical Summary         The information contained in this medical record document is considered private and confidential patient information. This information can only be used for the medical diagnosis and/or medical services that are being provided by the patient's selected caregivers. This information can only be distributed outside of the patient's care if the patient agrees and signs waivers of authorization for this information to be sent to an outside source or route.

## 2016-09-16 DIAGNOSIS — N4342 Spermatocele of epididymis, multiple: Secondary | ICD-10-CM | POA: Diagnosis not present

## 2016-10-17 DIAGNOSIS — N500 Atrophy of testis: Secondary | ICD-10-CM | POA: Diagnosis not present

## 2016-10-24 DIAGNOSIS — E291 Testicular hypofunction: Secondary | ICD-10-CM | POA: Diagnosis not present

## 2016-10-26 DIAGNOSIS — E291 Testicular hypofunction: Secondary | ICD-10-CM | POA: Diagnosis not present

## 2016-12-12 NOTE — Anesthesia Postprocedure Evaluation (Signed)
Anesthesia Post Note  Patient: Mark Cohen  Procedure(s) Performed: Procedure(s) (LRB): EPIDIDYMECTOMY (Right)     Anesthesia Post Evaluation  Last Vitals:  Vitals:   09/09/16 1258 09/09/16 1340  BP: 133/82 133/80  Pulse: 71 74  Resp: 16 16  Temp: 36.3 C     Last Pain:  Vitals:   09/12/16 1035  TempSrc:   PainSc: 4                  Anush Wiedeman S

## 2016-12-12 NOTE — Addendum Note (Signed)
Addendum  created 12/12/16 1152 by Myrtie Soman, MD   Sign clinical note

## 2017-02-09 ENCOUNTER — Ambulatory Visit (INDEPENDENT_AMBULATORY_CARE_PROVIDER_SITE_OTHER): Payer: Medicare Other | Admitting: Internal Medicine

## 2017-02-09 ENCOUNTER — Encounter: Payer: Self-pay | Admitting: Internal Medicine

## 2017-02-09 ENCOUNTER — Other Ambulatory Visit (INDEPENDENT_AMBULATORY_CARE_PROVIDER_SITE_OTHER): Payer: Medicare Other

## 2017-02-09 ENCOUNTER — Ambulatory Visit (INDEPENDENT_AMBULATORY_CARE_PROVIDER_SITE_OTHER)
Admission: RE | Admit: 2017-02-09 | Discharge: 2017-02-09 | Disposition: A | Discharge status: Home / Self Care | Payer: Medicare Other | Source: Ambulatory Visit | Attending: Internal Medicine | Admitting: Internal Medicine

## 2017-02-09 VITALS — BP 136/86 | HR 97 | Temp 99.2°F | Resp 16 | Ht 72.0 in | Wt 166.8 lb

## 2017-02-09 DIAGNOSIS — R05 Cough: Secondary | ICD-10-CM

## 2017-02-09 DIAGNOSIS — T63331A Toxic effect of venom of brown recluse spider, accidental (unintentional), initial encounter: Secondary | ICD-10-CM

## 2017-02-09 DIAGNOSIS — I10 Essential (primary) hypertension: Secondary | ICD-10-CM

## 2017-02-09 DIAGNOSIS — M159 Polyosteoarthritis, unspecified: Secondary | ICD-10-CM

## 2017-02-09 DIAGNOSIS — M150 Primary generalized (osteo)arthritis: Secondary | ICD-10-CM

## 2017-02-09 DIAGNOSIS — B9789 Other viral agents as the cause of diseases classified elsewhere: Secondary | ICD-10-CM

## 2017-02-09 DIAGNOSIS — E291 Testicular hypofunction: Secondary | ICD-10-CM | POA: Diagnosis not present

## 2017-02-09 DIAGNOSIS — M8949 Other hypertrophic osteoarthropathy, multiple sites: Secondary | ICD-10-CM

## 2017-02-09 DIAGNOSIS — F411 Generalized anxiety disorder: Secondary | ICD-10-CM | POA: Diagnosis not present

## 2017-02-09 DIAGNOSIS — R059 Cough, unspecified: Secondary | ICD-10-CM

## 2017-02-09 DIAGNOSIS — M5416 Radiculopathy, lumbar region: Secondary | ICD-10-CM

## 2017-02-09 DIAGNOSIS — J069 Acute upper respiratory infection, unspecified: Secondary | ICD-10-CM

## 2017-02-09 LAB — CBC WITH DIFFERENTIAL/PLATELET
BASOS ABS: 0 10*3/uL (ref 0.0–0.1)
Basophils Relative: 0.4 % (ref 0.0–3.0)
Eosinophils Absolute: 0.3 10*3/uL (ref 0.0–0.7)
Eosinophils Relative: 3.7 % (ref 0.0–5.0)
HCT: 44 % (ref 39.0–52.0)
Hemoglobin: 15 g/dL (ref 13.0–17.0)
LYMPHS ABS: 1.5 10*3/uL (ref 0.7–4.0)
Lymphocytes Relative: 17.6 % (ref 12.0–46.0)
MCHC: 34.1 g/dL (ref 30.0–36.0)
MCV: 92 fl (ref 78.0–100.0)
MONO ABS: 0.9 10*3/uL (ref 0.1–1.0)
Monocytes Relative: 10.4 % (ref 3.0–12.0)
NEUTROS ABS: 5.6 10*3/uL (ref 1.4–7.7)
NEUTROS PCT: 67.9 % (ref 43.0–77.0)
PLATELETS: 222 10*3/uL (ref 150.0–400.0)
RBC: 4.79 Mil/uL (ref 4.22–5.81)
RDW: 13.1 % (ref 11.5–15.5)
WBC: 8.3 10*3/uL (ref 4.0–10.5)

## 2017-02-09 LAB — COMPREHENSIVE METABOLIC PANEL
ALT: 28 U/L (ref 0–53)
AST: 32 U/L (ref 0–37)
Albumin: 4.3 g/dL (ref 3.5–5.2)
Alkaline Phosphatase: 80 U/L (ref 39–117)
BILIRUBIN TOTAL: 0.7 mg/dL (ref 0.2–1.2)
BUN: 14 mg/dL (ref 6–23)
CO2: 28 meq/L (ref 19–32)
Calcium: 9.5 mg/dL (ref 8.4–10.5)
Chloride: 103 mEq/L (ref 96–112)
Creatinine, Ser: 0.99 mg/dL (ref 0.40–1.50)
GFR: 80.24 mL/min (ref >60.00)
GLUCOSE: 85 mg/dL (ref 70–99)
Potassium: 4.6 mEq/L (ref 3.5–5.1)
SODIUM: 138 meq/L (ref 135–145)
Total Protein: 6.9 g/dL (ref 6.0–8.3)

## 2017-02-09 MED ORDER — HYDROCODONE-ACETAMINOPHEN 5-325 MG PO TABS: 1.0000 | ORAL_TABLET | Freq: Four times a day (QID) | ORAL | 0 refills | Status: DC | PRN

## 2017-02-09 MED ORDER — HYDROCODONE-HOMATROPINE 5-1.5 MG/5ML PO SYRP: 5.0000 mL | ORAL_SOLUTION | Freq: Three times a day (TID) | ORAL | 0 refills | Status: DC | PRN

## 2017-02-09 MED ORDER — ALPRAZOLAM 0.25 MG PO TABS: 0.2500 mg | ORAL_TABLET | Freq: Two times a day (BID) | ORAL | 2 refills | Status: DC | PRN

## 2017-02-09 NOTE — Patient Instructions (Signed)
Cough, Adult Coughing is a reflex that clears your throat and your airways. Coughing helps to heal and protect your lungs. It is normal to cough occasionally, but a cough that happens with other symptoms or lasts a long time may be a sign of a condition that needs treatment. A cough may last only 2-3 weeks (acute), or it may last longer than 8 weeks (chronic). What are the causes? Coughing is commonly caused by:  Breathing in substances that irritate your lungs.  A viral or bacterial respiratory infection.  Allergies.  Asthma.  Postnasal drip.  Smoking.  Acid backing up from the stomach into the esophagus (gastroesophageal reflux).  Certain medicines.  Chronic lung problems, including COPD (or rarely, lung cancer).  Other medical conditions such as heart failure.  Follow these instructions at home: Pay attention to any changes in your symptoms. Take these actions to help with your discomfort:  Take medicines only as told by your health care provider. ? If you were prescribed an antibiotic medicine, take it as told by your health care provider. Do not stop taking the antibiotic even if you start to feel better. ? Talk with your health care provider before you take a cough suppressant medicine.  Drink enough fluid to keep your urine clear or pale yellow.  If the air is dry, use a cold steam vaporizer or humidifier in your bedroom or your home to help loosen secretions.  Avoid anything that causes you to cough at work or at home.  If your cough is worse at night, try sleeping in a semi-upright position.  Avoid cigarette smoke. If you smoke, quit smoking. If you need help quitting, ask your health care provider.  Avoid caffeine.  Avoid alcohol.  Rest as needed.  Contact a health care provider if:  You have new symptoms.  You cough up pus.  Your cough does not get better after 2-3 weeks, or your cough gets worse.  You cannot control your cough with suppressant  medicines and you are losing sleep.  You develop pain that is getting worse or pain that is not controlled with pain medicines.  You have a fever.  You have unexplained weight loss.  You have night sweats. Get help right away if:  You cough up blood.  You have difficulty breathing.  Your heartbeat is very fast. This information is not intended to replace advice given to you by your health care provider. Make sure you discuss any questions you have with your health care provider. Document Released: 12/24/2010 Document Revised: 12/03/2015 Document Reviewed: 09/03/2014 Elsevier Interactive Patient Education  2017 [employer]  

## 2017-02-09 NOTE — Progress Notes (Signed)
Subjective:  Patient ID: Mark Cohen, male    DOB: July 21, 1950  Age: 66 y.o. MRN: [id number]  CC: URI; Cough; and Wound Check   HPI Mark Cohen presents for multiple concerns. He complains of fatigue and erectile dysfunction and wants to have his testosterone level checked.  He also complains of a 4 day history of nonproductive cough and sore throat with mild night sweats, low-grade fever, and chills.  In addition, he complains of a lesion on his right lower anterior extremity. He tells me he was on a cruise ship in [place] about 2 weeks ago when he noticed a "blueberry" on his right lower extremity that was asymptomatic. He says he was seen by the doctor on the cruise ship and he was told that they didn't know what it was. He notices the appearance of the area but otherwise does not bother him. He has been treating it with Neosporin.  Outpatient Medications Prior to Visit  Medication Sig Dispense Refill  . celecoxib (CELEBREX) 200 MG capsule Take 1 capsule (200 mg total) by mouth 2 (two) times daily. (Patient taking differently: Take 200 mg by mouth 2 (two) times daily as needed for moderate pain. ) 90 capsule 3  . cholecalciferol (VITAMIN D) 1000 UNITS tablet Take 1,000 Units by mouth daily.      . fluticasone (FLONASE) 50 MCG/ACT nasal spray Place 2 sprays into the nose daily as needed for allergies.     . ALPRAZolam (XANAX) 0.25 MG tablet TAKE 1 TABLET BY MOUTH TWICE DAILY AS NEEDED 35 tablet 2  . betamethasone dipropionate (DIPROLENE) 0.05 % cream Apply 1 application topically 2 (two) times daily as needed.    Marland Kitchen HYDROcodone-acetaminophen (NORCO/VICODIN) 5-325 MG tablet Take 1 tablet by mouth every 6 (six) hours as needed. 65 tablet 0   No facility-administered medications prior to visit.     ROS Review of Systems  Constitutional: Positive for chills, fatigue and fever. Negative for diaphoresis.  HENT: Positive for congestion and sore throat. Negative for facial swelling,  postnasal drip, rhinorrhea, sinus pressure, trouble swallowing and voice change.   Eyes: Negative.   Respiratory: Positive for cough. Negative for chest tightness, shortness of breath and wheezing.   Cardiovascular: Negative for chest pain, palpitations and leg swelling.  Gastrointestinal: Positive for constipation. Negative for abdominal pain, diarrhea, nausea and vomiting.  Endocrine: Negative.   Genitourinary: Negative.  Negative for difficulty urinating.  Musculoskeletal: Positive for arthralgias and back pain. Negative for joint swelling and myalgias.  Skin: Negative.  Negative for color change, pallor and rash.  Allergic/Immunologic: Negative.   Neurological: Negative.  Negative for dizziness and weakness.  Hematological: Negative.   Psychiatric/Behavioral: Negative for behavioral problems, decreased concentration, dysphoric mood, self-injury and suicidal ideas. The patient is nervous/anxious.     Objective:  BP 136/86 (BP Location: Right Arm, Patient Position: Sitting, Cuff Size: Normal)   Pulse 97   Temp 99.2 F (37.3 C) (Oral)   Resp 16   Ht 6' (1.829 m)   Wt 166 lb 12 oz (75.6 kg)   SpO2 99%   BMI 22.62 kg/m   BP Readings from Last 3 Encounters:  02/09/17 136/86  09/09/16 133/80  09/07/16 (!) 141/90    Wt Readings from Last 3 Encounters:  02/09/17 166 lb 12 oz (75.6 kg)  09/09/16 161 lb (73 kg)  09/07/16 161 lb (73 kg)    Physical Exam  Constitutional: He is oriented to person, place, and time.  Non-toxic appearance. He  does not have a sickly appearance. He does not appear ill. No distress.  HENT:  Mouth/Throat: Oropharynx is clear and moist. No oropharyngeal exudate.  Eyes: Conjunctivae are normal. Right eye exhibits no discharge. Left eye exhibits no discharge. No scleral icterus.  Neck: Normal range of motion. Neck supple. No JVD present. No thyromegaly present.  Cardiovascular: Normal rate, regular rhythm and intact distal pulses.  Exam reveals no gallop and  no friction rub.   No murmur heard. Pulmonary/Chest: Effort normal and breath sounds normal. No respiratory distress. He has no wheezes. He has no rales. He exhibits no tenderness.  Abdominal: Soft. Bowel sounds are normal. He exhibits no distension and no mass. There is no tenderness. There is no rebound and no guarding.  Musculoskeletal: Normal range of motion. He exhibits no edema, tenderness or deformity.       Legs: Lymphadenopathy:    He has no cervical adenopathy.  Neurological: He is alert and oriented to person, place, and time.  Skin: Skin is warm and dry. No rash noted. He is not diaphoretic. No erythema. No pallor.  Psychiatric: He has a normal mood and affect. His behavior is normal. Judgment and thought content normal.  Vitals reviewed.   Lab Results  Component Value Date   WBC 8.3 02/09/2017   HGB 15.0 02/09/2017   HCT 44.0 02/09/2017   PLT 222.0 02/09/2017   GLUCOSE 85 02/09/2017   CHOL 159 01/20/2016   TRIG 34.0 01/20/2016   HDL 76.80 01/20/2016   LDLCALC 76 01/20/2016   ALT 28 02/09/2017   AST 32 02/09/2017   NA 138 02/09/2017   K 4.6 02/09/2017   CL 103 02/09/2017   CREATININE 0.99 02/09/2017   BUN 14 02/09/2017   CO2 28 02/09/2017   TSH 1.93 02/09/2017   PSA 0.50 01/21/2015   HGBA1C 5.3 01/20/2016    Dg Chest 2 View  Result Date: 02/09/2017 CLINICAL DATA:  Cough, congestion, fever for 6 days. EXAM: CHEST  2 VIEW COMPARISON:  Chest x-ray dated 02/03/2010. FINDINGS: Heart size and mediastinal contours are within normal limits. Atherosclerotic changes noted at the aortic arch. Lungs are hyperexpanded suggesting COPD. Associated chronic bronchitic changes centrally. Lungs are clear. No pleural effusion or pneumothorax seen. Degenerative spurring noted within the thoracic spine. No acute or suspicious osseous finding. IMPRESSION: 1. No active cardiopulmonary disease. No evidence of pneumonia or pulmonary edema. 2. Hyperexpanded lungs suggesting COPD/emphysema.  Associated chronic bronchitic changes. 3. Aortic atherosclerosis. Electronically Signed   By: Franki Cabot M.D.   On: 02/09/2017 11:24    No results found.  Assessment & Plan:   Chaske was seen today for uri, cough and wound check.  Diagnoses and all orders for this visit:  Essential hypertension, benign- his blood pressure is well-controlled, labs are negative for any end organ damage or secondary causes. -     CBC with Differential/Platelet; Future -     Comprehensive metabolic panel; Future -     Thyroid Panel With TSH; Future  Brown recluse spider bite or sting, accidental or unintentional, initial encounter- I expect this to resolve on its own. No complications noted at this time. He will let me know if he develops any new symptoms related to this.  Left lumbar radiculitis -     HYDROcodone-acetaminophen (NORCO/VICODIN) 5-325 MG tablet; Take 1 tablet by mouth every 6 (six) hours as needed.  Primary osteoarthritis involving multiple joints -     HYDROcodone-acetaminophen (NORCO/VICODIN) 5-325 MG tablet; Take 1 tablet by  mouth every 6 (six) hours as needed.  GAD (generalized anxiety disorder) -     ALPRAZolam (XANAX) 0.25 MG tablet; Take 1 tablet (0.25 mg total) by mouth 2 (two) times daily as needed.  Hypogonadism male- his testosterone level is low. He will let me know if he wants to restart clomiphene. -     Testosterone Total,Free,Bio, Males; Future  Cough- his chest x-ray is normal, will treat for viral URI. -     HYDROcodone-homatropine (HYCODAN) 5-1.5 MG/5ML syrup; Take 5 mLs by mouth every 8 (eight) hours as needed for cough. -     DG Chest 2 View; Future  Viral URI with cough -     HYDROcodone-homatropine (HYCODAN) 5-1.5 MG/5ML syrup; Take 5 mLs by mouth every 8 (eight) hours as needed for cough.   I have discontinued Mr. Kinnett betamethasone dipropionate. I have also changed his ALPRAZolam. Additionally, I am having him start on HYDROcodone-homatropine. Lastly,  I am having him maintain his fluticasone, cholecalciferol, celecoxib, clomiPHENE, and HYDROcodone-acetaminophen.  Meds ordered this encounter  Medications  . clomiPHENE (CLOMID) 50 MG tablet    Sig: Take 50 mg by mouth daily.    Refill:  11  . HYDROcodone-acetaminophen (NORCO/VICODIN) 5-325 MG tablet    Sig: Take 1 tablet by mouth every 6 (six) hours as needed.    Dispense:  65 tablet    Refill:  0    Fill on or after 02/09/17  . ALPRAZolam (XANAX) 0.25 MG tablet    Sig: Take 1 tablet (0.25 mg total) by mouth 2 (two) times daily as needed.    Dispense:  35 tablet    Refill:  2  . HYDROcodone-homatropine (HYCODAN) 5-1.5 MG/5ML syrup    Sig: Take 5 mLs by mouth every 8 (eight) hours as needed for cough.    Dispense:  120 mL    Refill:  0     Follow-up: Return in about 3 months (around 05/12/2017).  Scarlette Calico, MD

## 2017-02-10 ENCOUNTER — Encounter: Payer: Self-pay | Admitting: Internal Medicine

## 2017-02-10 LAB — TESTOSTERONE TOTAL,FREE,BIO, MALES
ALBUMIN: 4.3 g/dL (ref 3.6–5.1)
SEX HORMONE BINDING: 106 nmol/L — AB (ref 22–77)
TESTOSTERONE: 100 ng/dL — AB (ref 250–827)

## 2017-02-10 LAB — THYROID PANEL WITH TSH
Free Thyroxine Index: 2.4 (ref 1.4–3.8)
T3 Uptake: 28 % (ref 22–35)
T4, Total: 8.4 ug/dL (ref 4.5–12.0)
TSH: 1.93 mIU/L (ref 0.40–4.50)

## 2017-04-25 DIAGNOSIS — Z125 Encounter for screening for malignant neoplasm of prostate: Secondary | ICD-10-CM | POA: Diagnosis not present

## 2017-04-25 DIAGNOSIS — N500 Atrophy of testis: Secondary | ICD-10-CM | POA: Diagnosis not present

## 2017-04-25 LAB — PSA: PSA: 0.14

## 2017-05-02 DIAGNOSIS — R351 Nocturia: Secondary | ICD-10-CM | POA: Diagnosis not present

## 2017-05-02 DIAGNOSIS — E291 Testicular hypofunction: Secondary | ICD-10-CM | POA: Diagnosis not present

## 2017-05-04 ENCOUNTER — Encounter: Payer: Self-pay | Admitting: Internal Medicine

## 2017-05-16 DIAGNOSIS — Z23 Encounter for immunization: Secondary | ICD-10-CM | POA: Diagnosis not present

## 2017-06-22 ENCOUNTER — Encounter: Payer: Self-pay | Admitting: Internal Medicine

## 2017-06-22 ENCOUNTER — Ambulatory Visit (INDEPENDENT_AMBULATORY_CARE_PROVIDER_SITE_OTHER): Payer: Medicare Other | Admitting: Internal Medicine

## 2017-06-22 VITALS — BP 130/80 | HR 66 | Temp 99.0°F | Resp 16 | Ht 72.0 in | Wt 171.8 lb

## 2017-06-22 DIAGNOSIS — M25532 Pain in left wrist: Secondary | ICD-10-CM

## 2017-06-22 DIAGNOSIS — M25531 Pain in right wrist: Secondary | ICD-10-CM

## 2017-06-22 DIAGNOSIS — G5603 Carpal tunnel syndrome, bilateral upper limbs: Secondary | ICD-10-CM | POA: Insufficient documentation

## 2017-06-22 NOTE — Patient Instructions (Signed)
Carpal Tunnel Syndrome Carpal tunnel syndrome is a condition that causes pain in your hand and arm. The carpal tunnel is a narrow area located on the palm side of your wrist. Repeated wrist motion or certain diseases may cause swelling within the tunnel. This swelling pinches the main nerve in the wrist (median nerve). What are the causes? This condition may be caused by:  Repeated wrist motions.  Wrist injuries.  Arthritis.  A cyst or tumor in the carpal tunnel.  Fluid buildup during pregnancy.  Sometimes the cause of this condition is not known. What increases the risk? This condition is more likely to develop in:  People who have jobs that cause them to repeatedly move their wrists in the same motion, such as butchers and cashiers.  Women.  People with certain conditions, such as: ? Diabetes. ? Obesity. ? An underactive thyroid (hypothyroidism). ? Kidney failure.  What are the signs or symptoms? Symptoms of this condition include:  A tingling feeling in your fingers, especially in your thumb, index, and middle fingers.  Tingling or numbness in your hand.  An aching feeling in your entire arm, especially when your wrist and elbow are bent for long periods of time.  Wrist pain that goes up your arm to your shoulder.  Pain that goes down into your palm or fingers.  A weak feeling in your hands. You may have trouble grabbing and holding items.  Your symptoms may feel worse during the night. How is this diagnosed? This condition is diagnosed with a medical history and physical exam. You may also have tests, including:  An electromyogram (EMG). This test measures electrical signals sent by your nerves into the muscles.  X-rays.  How is this treated? Treatment for this condition includes:  Lifestyle changes. It is important to stop doing or modify the activity that caused your condition.  Physical or occupational therapy.  Medicines for pain and inflammation.  This may include medicine that is injected into your wrist.  A wrist splint.  Surgery.  Follow these instructions at home: If you have a splint:  Wear it as told by your health care provider. Remove it only as told by your health care provider.  Loosen the splint if your fingers become numb and tingle, or if they turn cold and blue.  Keep the splint clean and dry. General instructions  Take over-the-counter and prescription medicines only as told by your health care provider.  Rest your wrist from any activity that may be causing your pain. If your condition is work related, talk to your employer about changes that can be made, such as getting a wrist pad to use while typing.  If directed, apply ice to the painful area: ? Put ice in a plastic bag. ? Place a towel between your skin and the bag. ? Leave the ice on for 20 minutes, 2-3 times per day.  Keep all follow-up visits as told by your health care provider. This is important.  Do any exercises as told by your health care provider, physical therapist, or occupational therapist. Contact a health care provider if:  You have new symptoms.  Your pain is not controlled with medicines.  Your symptoms get worse. This information is not intended to replace advice given to you by your health care provider. Make sure you discuss any questions you have with your health care provider. Document Released: 06/24/2000 Document Revised: 11/05/2015 Document Reviewed: 11/12/2014 Elsevier Interactive Patient Education  2017 [employer]  

## 2017-06-23 ENCOUNTER — Encounter: Payer: Self-pay | Admitting: Neurology

## 2017-06-23 ENCOUNTER — Encounter: Payer: Self-pay | Admitting: Internal Medicine

## 2017-06-23 ENCOUNTER — Other Ambulatory Visit: Payer: Self-pay | Admitting: *Deleted

## 2017-06-23 DIAGNOSIS — M25532 Pain in left wrist: Principal | ICD-10-CM

## 2017-06-23 DIAGNOSIS — M25531 Pain in right wrist: Secondary | ICD-10-CM

## 2017-06-23 NOTE — Progress Notes (Signed)
Subjective:  Patient ID: Mark Cohen, male    DOB: Nov 11, 1950  Age: 66 y.o. MRN: [id number]  CC: Wrist Pain   HPI Mark Cohen presents for a 6-week history of bilateral hand and wrist pain.  The symptoms worsen at night.  He says his wrists feel swollen during the night but he does not notice any swelling during the day.  He also complains of numbness and tingling in both thumbs.  He is also developing a trigger mechanism in his ring fingers.  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  He is taking Tylenol and an over-the-counter anti-inflammatory for symptom relief.  Outpatient Medications Prior to Visit  Medication Sig Dispense Refill  . ALPRAZolam (XANAX) 0.25 MG tablet Take 1 tablet (0.25 mg total) by mouth 2 (two) times daily as needed. 35 tablet 2  . celecoxib (CELEBREX) 200 MG capsule Take 1 capsule (200 mg total) by mouth 2 (two) times daily. (Patient taking differently: Take 200 mg by mouth 2 (two) times daily as needed for moderate pain. ) 90 capsule 3  . cholecalciferol (VITAMIN D) 1000 UNITS tablet Take 1,000 Units by mouth daily.      . clomiPHENE (CLOMID) 50 MG tablet Take 50 mg by mouth daily.  11  . fluticasone (FLONASE) 50 MCG/ACT nasal spray Place 2 sprays into the nose daily as needed for allergies.     Marland Kitchen HYDROcodone-acetaminophen (NORCO/VICODIN) 5-325 MG tablet Take 1 tablet by mouth every 6 (six) hours as needed. 65 tablet 0  . HYDROcodone-homatropine (HYCODAN) 5-1.5 MG/5ML syrup Take 5 mLs by mouth every 8 (eight) hours as needed for cough. 120 mL 0   No facility-administered medications prior to visit.     ROS Review of Systems  Constitutional: Negative for fatigue.  HENT: Negative.   Eyes: Negative.   Respiratory: Negative for cough, chest tightness, shortness of breath and wheezing.   Cardiovascular: Negative for chest pain, palpitations and leg swelling.  Gastrointestinal: Negative.  Negative for  abdominal pain, constipation, diarrhea, nausea and vomiting.  Endocrine: Negative.   Genitourinary: Negative.   Musculoskeletal: Positive for arthralgias and joint swelling. Negative for back pain, myalgias, neck pain and neck stiffness.  Skin: Negative.   Allergic/Immunologic: Negative.   Neurological: Positive for numbness. Negative for dizziness, weakness and headaches.  Hematological: Negative for adenopathy. Does not bruise/bleed easily.  Psychiatric/Behavioral: Negative.     Objective:  BP 130/80 (BP Location: Left Arm, Patient Position: Sitting, Cuff Size: Normal)   Pulse 66   Temp 99 F (37.2 C) (Oral)   Resp 16   Ht 6' (1.829 m)   Wt 171 lb 12 oz (77.9 kg)   SpO2 98%   BMI 23.29 kg/m   BP Readings from Last 3 Encounters:  06/22/17 130/80  02/09/17 136/86  09/09/16 133/80    Wt Readings from Last 3 Encounters:  06/22/17 171 lb 12 oz (77.9 kg)  02/09/17 166 lb 12 oz (75.6 kg)  09/09/16 161 lb (73 kg)    Physical Exam  Constitutional: He is oriented to person, place, and time. No distress.  HENT:  Mouth/Throat: Oropharynx is clear and moist. No oropharyngeal exudate.  Eyes: Conjunctivae are normal. Left eye exhibits no discharge. No scleral icterus.  Neck: Normal range of motion. Neck supple. No JVD present. No thyromegaly present.  Cardiovascular: Normal rate, regular rhythm and normal heart sounds.  No murmur heard. Pulmonary/Chest: Effort normal and breath sounds normal. No respiratory  distress. He has no wheezes. He has no rales.  Abdominal: Soft. Bowel sounds are normal. He exhibits no mass. There is no tenderness. There is no guarding.  Musculoskeletal: Normal range of motion. He exhibits no edema, tenderness or deformity.       Right wrist: Normal. He exhibits normal range of motion, no tenderness, no swelling and no deformity.       Left wrist: Normal. He exhibits normal range of motion, no tenderness, no swelling and no deformity.       Cervical back:  Normal. He exhibits normal range of motion, no tenderness, no bony tenderness, no swelling, no edema and no deformity.  Lymphadenopathy:    He has no cervical adenopathy.  Neurological: He is alert and oriented to person, place, and time. He displays no atrophy and normal reflexes. No sensory deficit.  He has a mildly positive Tinel sign on the left side but negative on the right. Phalen's test is negative bilaterally.  Skin: Skin is warm and dry. No rash noted. He is not diaphoretic. No erythema. No pallor.  Vitals reviewed.   Lab Results  Component Value Date   WBC 8.3 02/09/2017   HGB 15.0 02/09/2017   HCT 44.0 02/09/2017   PLT 222.0 02/09/2017   GLUCOSE 85 02/09/2017   CHOL 159 01/20/2016   TRIG 34.0 01/20/2016   HDL 76.80 01/20/2016   LDLCALC 76 01/20/2016   ALT 28 02/09/2017   AST 32 02/09/2017   NA 138 02/09/2017   K 4.6 02/09/2017   CL 103 02/09/2017   CREATININE 0.99 02/09/2017   BUN 14 02/09/2017   CO2 28 02/09/2017   TSH 1.93 02/09/2017   PSA 0.14 04/25/2017   HGBA1C 5.3 01/20/2016    Dg Chest 2 View  Result Date: 02/09/2017 CLINICAL DATA:  Cough, congestion, fever for 6 days. EXAM: CHEST  2 VIEW COMPARISON:  Chest x-ray dated 02/03/2010. FINDINGS: Heart size and mediastinal contours are within normal limits. Atherosclerotic changes noted at the aortic arch. Lungs are hyperexpanded suggesting COPD. Associated chronic bronchitic changes centrally. Lungs are clear. No pleural effusion or pneumothorax seen. Degenerative spurring noted within the thoracic spine. No acute or suspicious osseous finding. IMPRESSION: 1. No active cardiopulmonary disease. No evidence of pneumonia or pulmonary edema. 2. Hyperexpanded lungs suggesting COPD/emphysema. Associated chronic bronchitic changes. 3. Aortic atherosclerosis. Electronically Signed   By: Franki Cabot M.D.   On: 02/09/2017 11:24    Assessment & Plan:   Walden was seen today for wrist pain.  Diagnoses and all orders for  this visit:  Bilateral wrist pain- his symptoms and exam are concerning for carpal tunnel syndrome.  I do not see any evidence of arthritis or synovitis in the wrists or hands at this time.  Is the screening for carpal tunnel syndrome is negative then I will pursue a rheumatologic workup for wrist pain and swelling.  For now will get an NCS/EMG done to confirm or exclude the diagnosis of CTS. -     Ambulatory referral to Neurology   I have discontinued Mark Cohen "JOE"'s HYDROcodone-homatropine. I am also having him maintain his fluticasone, cholecalciferol, celecoxib, clomiPHENE, HYDROcodone-acetaminophen, and ALPRAZolam.  No orders of the defined types were placed in this encounter.    Follow-up: Return in about 3 weeks (around 07/13/2017).  Scarlette Calico, MD

## 2017-07-13 ENCOUNTER — Encounter: Payer: Medicare Other | Admitting: Neurology

## 2017-07-26 DIAGNOSIS — E291 Testicular hypofunction: Secondary | ICD-10-CM | POA: Diagnosis not present

## 2017-08-02 DIAGNOSIS — R232 Flushing: Secondary | ICD-10-CM | POA: Diagnosis not present

## 2017-08-02 DIAGNOSIS — E291 Testicular hypofunction: Secondary | ICD-10-CM | POA: Diagnosis not present

## 2017-08-07 ENCOUNTER — Other Ambulatory Visit: Payer: Self-pay | Admitting: *Deleted

## 2017-08-07 DIAGNOSIS — R200 Anesthesia of skin: Secondary | ICD-10-CM

## 2017-08-17 ENCOUNTER — Other Ambulatory Visit: Payer: Self-pay | Admitting: Internal Medicine

## 2017-08-17 DIAGNOSIS — F411 Generalized anxiety disorder: Secondary | ICD-10-CM

## 2017-08-24 ENCOUNTER — Encounter: Payer: Self-pay | Admitting: Internal Medicine

## 2017-08-24 ENCOUNTER — Ambulatory Visit (INDEPENDENT_AMBULATORY_CARE_PROVIDER_SITE_OTHER): Payer: Medicare Other | Admitting: Neurology

## 2017-08-24 DIAGNOSIS — G5603 Carpal tunnel syndrome, bilateral upper limbs: Secondary | ICD-10-CM

## 2017-08-24 DIAGNOSIS — R200 Anesthesia of skin: Secondary | ICD-10-CM

## 2017-08-24 NOTE — Procedures (Signed)
[facility]  [address]  [place], [place] [address] Tel: [phone] Fax:  [phone] Test Date:  08/24/2017  Patient: Mark Cohen DOB: 06-26-1951 Physician: Narda Amber, DO  Sex: Male Height: 6\' 0"  Ref Phys: Janith Lima, MD  ID#: [id number] Temp: 35.7C Technician:    Patient Complaints: This is a 67 year-old man referred for evaluation of bilateral thumb numbness.  NCV & EMG Findings: Electrodiagnostic testing of the right upper extremity and additional studies of the left shows:  1. Bilateral median and ulnar sensory responses are within normal limits. Bilateral mixed palmer sensory responses show prolonged latency. 2. Bilateral median and ulnar motor responses are within normal limits. 3. There is no evidence of active or chronic motor axonal loss changes affecting any of the tested muscles. Motor unit configuration and recruitment pattern is within normal limits.  Impression: Bilateral median neuropathy at or distal to the wrist, consistent with clinical diagnosis of carpal tunnel syndrome. Overall, these findings are very mild in degree electrically and worse on the right.   ___________________________ Narda Amber, DO    Nerve Conduction Studies Anti Sensory Summary Table   Site NR Peak (ms) Norm Peak (ms) P-T Amp (V) Norm P-T Amp  Left Median Anti Sensory (2nd Digit)  35.7C  Wrist    3.3 <3.8 20.4 >10  Right Median Anti Sensory (2nd Digit)  35.7C  Wrist    3.8 <3.8 12.4 >10  Left Ulnar Anti Sensory (5th Digit)  35.7C  Wrist    3.2 <3.2 11.1 >5  Right Ulnar Anti Sensory (5th Digit)  35.7C  Wrist    2.7 <3.2 16.5 >5   Motor Summary Table   Site NR Onset (ms) Norm Onset (ms) O-P Amp (mV) Norm O-P Amp Site1 Site2 Delta-0 (ms) Dist (cm) Vel (m/s) Norm Vel (m/s)  Left Median Motor (Abd Poll Brev)  35.7C  Wrist    3.0 <4.0 8.7 >5 Elbow Wrist 5.4 30.0 56 >50  Elbow    8.4  8.6         Right Median Motor (Abd Poll Brev)   35.7C  Wrist    3.6 <4.0 8.4 >5 Elbow Wrist 6.1 32.0 52 >50  Elbow    9.7  8.0         Left Ulnar Motor (Abd Dig Minimi)  35.7C  Wrist    2.1 <3.1 9.0 >7 B Elbow Wrist 4.4 27.0 61 >50  B Elbow    6.5  8.8  A Elbow B Elbow 1.7 10.0 59 >50  A Elbow    8.2  8.7         Right Ulnar Motor (Abd Dig Minimi)  35.7C  Wrist    2.3 <3.1 8.4 >7 B Elbow Wrist 4.2 26.0 62 >50  B Elbow    6.5  7.6  A Elbow B Elbow 1.6 10.0 63 >50  A Elbow    8.1  7.3          Comparison Summary Table   Site NR Peak (ms) Norm Peak (ms) P-T Amp (V) Site1 Site2 Delta-P (ms) Norm Delta (ms)  Left Median/Ulnar Palm Comparison (Wrist - 8cm)  35.7C  Median Palm    1.9 <2.2 33.2 Median Palm Ulnar Palm 0.4   Ulnar Palm    1.5 <2.2 12.9      Right Median/Ulnar Palm Comparison (Wrist - 8cm)  35.7C  Median Palm    2.4 <2.2 16.1 Median Palm Ulnar Palm 0.8  Ulnar Palm    1.6 <2.2 15.4       EMG   Side Muscle Ins Act Fibs Psw Fasc Number Recrt Dur Dur. Amp Amp. Poly Poly. Comment  Right 1stDorInt Nml Nml Nml Nml Nml Nml Nml Nml Nml Nml Nml Nml N/A  Right Abd Poll Brev Nml Nml Nml Nml Nml Nml Nml Nml Nml Nml Nml Nml N/A  Right PronatorTeres Nml Nml Nml Nml Nml Nml Nml Nml Nml Nml Nml Nml N/A  Right Triceps Nml Nml Nml Nml Nml Nml Nml Nml Nml Nml Nml Nml N/A  Right Biceps Nml Nml Nml Nml Nml Nml Nml Nml Nml Nml Nml Nml N/A  Right Deltoid Nml Nml Nml Nml Nml Nml Nml Nml Nml Nml Nml Nml N/A  Left 1stDorInt Nml Nml Nml Nml Nml Nml Nml Nml Nml Nml Nml Nml N/A  Left Abd Poll Brev Nml Nml Nml Nml Nml Nml Nml Nml Nml Nml Nml Nml N/A  Left PronatorTeres Nml Nml Nml Nml Nml Nml Nml Nml Nml Nml Nml Nml N/A      Waveforms:

## 2017-09-08 ENCOUNTER — Emergency Department (HOSPITAL_COMMUNITY)
Admission: EM | Admit: 2017-09-08 | Discharge: 2017-09-08 | Disposition: A | Discharge status: Home / Self Care | Payer: Medicare Other | Attending: Emergency Medicine | Admitting: Emergency Medicine

## 2017-09-08 ENCOUNTER — Encounter (HOSPITAL_COMMUNITY): Payer: Self-pay | Admitting: Emergency Medicine

## 2017-09-08 ENCOUNTER — Other Ambulatory Visit: Payer: Self-pay

## 2017-09-08 DIAGNOSIS — I10 Essential (primary) hypertension: Secondary | ICD-10-CM | POA: Diagnosis not present

## 2017-09-08 DIAGNOSIS — Z87891 Personal history of nicotine dependence: Secondary | ICD-10-CM | POA: Insufficient documentation

## 2017-09-08 DIAGNOSIS — R55 Syncope and collapse: Secondary | ICD-10-CM

## 2017-09-08 DIAGNOSIS — R404 Transient alteration of awareness: Secondary | ICD-10-CM | POA: Diagnosis not present

## 2017-09-08 LAB — CBC
HEMATOCRIT: 42.3 % (ref 39.0–52.0)
Hemoglobin: 14.9 g/dL (ref 13.0–17.0)
MCH: 32.1 pg (ref 26.0–34.0)
MCHC: 35.2 g/dL (ref 30.0–36.0)
MCV: 91.2 fL (ref 78.0–100.0)
PLATELETS: 174 10*3/uL (ref 150–400)
RBC: 4.64 MIL/uL (ref 4.22–5.81)
RDW: 12.9 % (ref 11.5–15.5)
WBC: 7 10*3/uL (ref 4.0–10.5)

## 2017-09-08 LAB — BASIC METABOLIC PANEL
Anion gap: 9 (ref 5–15)
BUN: 10 mg/dL (ref 6–20)
CHLORIDE: 103 mmol/L (ref 101–111)
CO2: 22 mmol/L (ref 22–32)
CREATININE: 0.88 mg/dL (ref 0.61–1.24)
Calcium: 9 mg/dL (ref 8.9–10.3)
GFR calc Af Amer: >60 mL/min (ref >60)
GFR calc non Af Amer: >60 mL/min (ref >60)
GLUCOSE: 127 mg/dL — AB (ref 65–99)
Potassium: 4.4 mmol/L (ref 3.5–5.1)
Sodium: 134 mmol/L — ABNORMAL LOW (ref 135–145)

## 2017-09-08 LAB — I-STAT TROPONIN, ED: Troponin i, poc: 0 ng/mL (ref 0.00–0.08)

## 2017-09-08 MED ORDER — SODIUM CHLORIDE 0.9 % IV BOLUS (SEPSIS)
1000.0000 mL | Freq: Once | INTRAVENOUS | Status: AC
  Administered 2017-09-08: 1000 mL via INTRAVENOUS

## 2017-09-08 NOTE — ED Provider Notes (Addendum)
[facility] EMERGENCY DEPARTMENT Provider Note   CSN: [id number] Arrival date & time: 09/08/17  1027     History   Chief Complaint Chief Complaint  Patient presents with  . Loss of Consciousness    HPI Mark Cohen is a 67 y.o. male.  Mark Cohen is a 67 y.o. Male history of allergic rhinitis, GERD, who presents to the ED for evaluation after a syncopal episode this morning.  Patient reports he was out to breakfast when he started to feel faint and passed out.  Patient reports he was at breakfast with a friend who helped lower him to the ground he did not fall, did not hit his head.  Patient reports just prior to the episode he started to feel like his head was heavy and "swimmy" and his vision started to go dark and he felt like he needed to lay down, his friend helped him to the floor and he "blacked out", and immediately came back to and felt normal afterwards just a little woozy.  Patient denies any chest pain or shortness of breath preceding or after the syncopal episode.  Patient denies any headache, vision changes, dizziness, numbness, weakness or tingling in any of the extremities.  Patient reports similar episode about 10 years prior which was from dehydration and he thinks this was likely what caused it today.  All he had today was 2 cups of coffee had not had anything to eat yet today either.  Patient denies any current symptoms and reports he feels back to normal.      Past Medical History:  Diagnosis Date  . Allergic rhinitis   . Colon polyps   . Epididymal cyst    right  . GERD (gastroesophageal reflux disease)   . Hepatitis 1961   hepatitis a as child, no liver oproblems since  . Hip pain    both    Patient Active Problem List   Diagnosis Date Noted  . Bilateral wrist pain 06/22/2017  . Brown recluse spider bite or sting 02/09/2017  . Hypogonadism male 02/09/2017  . Viral URI with cough 02/09/2017  . Hyperlipidemia with target LDL  less than 130 01/14/2016  . GAD (generalized anxiety disorder) 01/21/2015  . DJD (degenerative joint disease), multiple sites 05/10/2013  . Left lumbar radiculitis 08/12/2011  . Essential hypertension, benign 08/12/2011  . Routine general medical examination at a health care facility 04/28/2011  . ALLERGIC RHINITIS 01/29/2010  . GERD 01/29/2010  . Cough 01/29/2010    Past Surgical History:  Procedure Laterality Date  . APPENDECTOMY    . colonscopy  07/27/2016  . EPIDIDYMECTOMY  08/29/2002   Sees Dr Gaynelle Arabian twice a year left side  . EPIDIDYMECTOMY Right 09/09/2016   Procedure: EPIDIDYMECTOMY;  Surgeon: Carolan Clines, MD;  Location: [facility];  Service: Urology;  Laterality: Right;  . NASAL SINUS SURGERY  2008       Home Medications    Prior to Admission medications   Medication Sig Start Date End Date Taking? Authorizing Provider  ALPRAZolam Duanne Moron) 0.25 MG tablet TAKE 1 TABLET BY MOUTH 2 TIMES DAILY AS NEEDED 08/18/17  Yes Janith Lima, MD  cholecalciferol (VITAMIN D) 1000 UNITS tablet Take 1,000 Units by mouth daily.     Yes [provider]  fluticasone (FLONASE) 50 MCG/ACT nasal spray Place 2 sprays into the nose daily as needed for allergies.    Yes [provider]  HYDROcodone-acetaminophen (NORCO/VICODIN) 5-325 MG tablet Take 1 tablet by  mouth every 6 (six) hours as needed. 02/09/17  Yes Janith Lima, MD  celecoxib (CELEBREX) 200 MG capsule Take 1 capsule (200 mg total) by mouth 2 (two) times daily. Patient not taking: Reported on 09/08/2017 01/14/16   Janith Lima, MD    Family History Family History  Problem Relation Age of Onset  . Arthritis Unknown   . Stroke Unknown   . Cancer Neg Hx     Social History Social History   Tobacco Use  . Smoking status: Former Smoker    Packs/day: 1.00    Years: 7.00    Pack years: 7.00    Types: Cigarettes  . Smokeless tobacco: Never Used  Substance Use Topics  . Alcohol use: Yes    Comment: occ    . Drug use: No     Allergies   Patient has no known allergies.   Review of Systems Review of Systems  Constitutional: Negative for chills and fever.  HENT: Negative for congestion, rhinorrhea and sore throat.   Eyes: Negative for visual disturbance.  Respiratory: Negative for cough and shortness of breath.   Cardiovascular: Negative for chest pain and palpitations.  Gastrointestinal: Negative for abdominal pain, diarrhea, nausea and vomiting.  Genitourinary: Negative for dysuria and frequency.  Musculoskeletal: Negative for back pain and myalgias.  Skin: Negative for pallor and rash.  Neurological: Positive for syncope. Negative for dizziness, weakness, light-headedness and headaches.     Physical Exam Updated Vital Signs BP 118/76   Pulse 64   Temp 98 F (36.7 C) (Oral)   Resp (!) 27   Ht 6' (1.829 m)   Wt 77.1 kg (170 lb)   SpO2 97%   BMI 23.06 kg/m   Physical Exam  Constitutional: He appears well-developed and well-nourished. No distress.  HENT:  Head: Normocephalic and atraumatic.  Eyes: EOM are normal. Pupils are equal, round, and reactive to light. Right eye exhibits no discharge. Left eye exhibits no discharge.  No nystagmus  Neck: Neck supple. No JVD present. No tracheal deviation present.  Cardiovascular: Normal rate, regular rhythm, normal heart sounds and intact distal pulses.  Pulses:      Radial pulses are 2+ on the right side, and 2+ on the left side.       Dorsalis pedis pulses are 2+ on the right side, and 2+ on the left side.  Pulmonary/Chest: Effort normal and breath sounds normal. No stridor. No respiratory distress. He has no wheezes. He has no rales.  Respirations equal and unlabored, patient able to speak in full sentences, lungs clear to auscultation bilaterally  Abdominal: Soft. Bowel sounds are normal. He exhibits no distension and no mass. There is no tenderness. There is no guarding.  Musculoskeletal: He exhibits no edema or deformity.   Neurological: He is alert. Coordination normal.  Speech is clear, able to follow commands CN III-XII intact Normal strength in upper and lower extremities bilaterally including dorsiflexion and plantar flexion, strong and equal grip strength Sensation normal to light and sharp touch Moves extremities without ataxia, coordination intact Normal finger to nose and rapid alternating movements No pronator drift  Skin: Skin is warm and dry. Capillary refill takes less than 2 seconds. He is not diaphoretic.  Psychiatric: He has a normal mood and affect. His behavior is normal.  Nursing note and vitals reviewed.    ED Treatments / Results  Labs (all labs ordered are listed, but only abnormal results are displayed) Labs Reviewed  BASIC METABOLIC PANEL - Abnormal;  Notable for the following components:      Result Value   Sodium 134 (*)    Glucose, Bld 127 (*)    All other components within normal limits  CBC  I-STAT TROPONIN, ED    EKG  EKG Interpretation  Date/Time:  Friday September 08 2017 10:28:05 EST Ventricular Rate:  69 PR Interval:    QRS Duration: 87 QT Interval:  413 QTC Calculation: 443 R Axis:   3 Text Interpretation:  Sinus rhythm Abnormal R-wave progression, early transition Confirmed by Milton Ferguson [phone]) on 09/08/2017 2:47:35 PM       Radiology No results found.  Procedures Procedures (including critical care time)  Medications Ordered in ED Medications  sodium chloride 0.9 % bolus 1,000 mL (0 mLs Intravenous Stopped 09/08/17 1438)     Initial Impression / Assessment and Plan / ED Course  I have reviewed the triage vital signs and the nursing notes.  Pertinent labs & imaging results that were available during my care of the patient were reviewed by me and considered in my medical decision making (see chart for details).  Patient without cardiac history, presents to the ED for evaluation after syncopal episode, reports history of the same several years  ago in the setting of dehydration.  No chest pain or shortness of breath, headache or dizziness preceding or after syncopal episode.  On exam patient is well-appearing with normal vitals and reports he is feeling completely back to normal.  Heart and lung exam unremarkable, no neurologic deficits on exam.  Basic lab work, trop, EKG, orthostatics and give fluids.  EKG reassuring, no ischemic changes, troponin neg, labs shows normal Hgb, no leukocytosis, no acute electrolyte derangements, glucose 127, but pt not fasting.  After fluids patient ambulatory in the department without issue.  Reports he is feeling completely back to normal.  At this time I feel patient is stable for discharge home and close follow-up with his primary care provider in the next week.  Vitals have remained normal throughout ED stay.  Strict return precautions discussed.  Patient expresses understanding and is in agreement with plan.  Patient discussed with Dr. Roderic Palau who is in agreement with plan.  Final Clinical Impressions(s) / ED Diagnoses   Final diagnoses:  Syncope, unspecified syncope type    ED Discharge Orders    None       Jacqlyn Larsen, [place] 09/08/17 1510    Jacqlyn Larsen, PA-C 09/08/17 1512    Milton Ferguson, MD 09/08/17 [phone]

## 2017-09-08 NOTE — ED Notes (Signed)
Pt given cup of water per RN.

## 2017-09-08 NOTE — Discharge Instructions (Signed)
Your evaluation today is reassuring labs and EKG look good, the syncope may have been caused by dehydration please make sure you are drinking plenty of fluids at home.  If you have additional episodes of syncope, chest pain, shortness of breath, dizziness or headache or other new or concerning symptoms please return to the ED for reevaluation otherwise please follow-up with your primary care doctor within the next week.

## 2017-09-08 NOTE — ED Triage Notes (Signed)
Pt arrives via EMS from [employer] where pt was about to eat breakfast. Friend saw him slowly become unresponsive and black out. Pt assisted down to the ground. Denies pain. Pt alert, oriented x4, VSS. 247ml PTA. 20g Left hand.

## 2017-09-14 ENCOUNTER — Encounter: Payer: Self-pay | Admitting: Internal Medicine

## 2017-09-14 ENCOUNTER — Other Ambulatory Visit (INDEPENDENT_AMBULATORY_CARE_PROVIDER_SITE_OTHER): Payer: Medicare Other

## 2017-09-14 ENCOUNTER — Ambulatory Visit (INDEPENDENT_AMBULATORY_CARE_PROVIDER_SITE_OTHER): Payer: Medicare Other | Admitting: Internal Medicine

## 2017-09-14 VITALS — BP 118/80 | HR 56 | Temp 98.8°F | Resp 16 | Ht 72.0 in | Wt 175.5 lb

## 2017-09-14 DIAGNOSIS — I10 Essential (primary) hypertension: Secondary | ICD-10-CM

## 2017-09-14 DIAGNOSIS — M5416 Radiculopathy, lumbar region: Secondary | ICD-10-CM | POA: Diagnosis not present

## 2017-09-14 DIAGNOSIS — M150 Primary generalized (osteo)arthritis: Secondary | ICD-10-CM | POA: Diagnosis not present

## 2017-09-14 DIAGNOSIS — G5603 Carpal tunnel syndrome, bilateral upper limbs: Secondary | ICD-10-CM | POA: Diagnosis not present

## 2017-09-14 DIAGNOSIS — R739 Hyperglycemia, unspecified: Secondary | ICD-10-CM

## 2017-09-14 DIAGNOSIS — M8949 Other hypertrophic osteoarthropathy, multiple sites: Secondary | ICD-10-CM

## 2017-09-14 DIAGNOSIS — M159 Polyosteoarthritis, unspecified: Secondary | ICD-10-CM

## 2017-09-14 LAB — BASIC METABOLIC PANEL
BUN: 14 mg/dL (ref 6–23)
CHLORIDE: 103 meq/L (ref 96–112)
CO2: 28 meq/L (ref 19–32)
CREATININE: 0.91 mg/dL (ref 0.40–1.50)
Calcium: 9.8 mg/dL (ref 8.4–10.5)
GFR: 88.28 mL/min (ref >60.00)
GLUCOSE: 88 mg/dL (ref 70–99)
POTASSIUM: 4.5 meq/L (ref 3.5–5.1)
Sodium: 137 mEq/L (ref 135–145)

## 2017-09-14 LAB — HEMOGLOBIN A1C: Hgb A1c MFr Bld: 5.5 % (ref 4.6–6.5)

## 2017-09-14 MED ORDER — HYDROCODONE-ACETAMINOPHEN 5-325 MG PO TABS: 1.0000 | ORAL_TABLET | Freq: Four times a day (QID) | ORAL | 0 refills | Status: DC | PRN

## 2017-09-14 MED ORDER — NAPROXEN 375 MG PO TBEC: 1.0000 | DELAYED_RELEASE_TABLET | Freq: Two times a day (BID) | ORAL | 0 refills | Status: DC

## 2017-09-14 NOTE — Patient Instructions (Signed)
Carpal Tunnel Syndrome Carpal tunnel syndrome is a condition that causes pain in your hand and arm. The carpal tunnel is a narrow area located on the palm side of your wrist. Repeated wrist motion or certain diseases may cause swelling within the tunnel. This swelling pinches the main nerve in the wrist (median nerve). What are the causes? This condition may be caused by:  Repeated wrist motions.  Wrist injuries.  Arthritis.  A cyst or tumor in the carpal tunnel.  Fluid buildup during pregnancy.  Sometimes the cause of this condition is not known. What increases the risk? This condition is more likely to develop in:  People who have jobs that cause them to repeatedly move their wrists in the same motion, such as butchers and cashiers.  Women.  People with certain conditions, such as: ? Diabetes. ? Obesity. ? An underactive thyroid (hypothyroidism). ? Kidney failure.  What are the signs or symptoms? Symptoms of this condition include:  A tingling feeling in your fingers, especially in your thumb, index, and middle fingers.  Tingling or numbness in your hand.  An aching feeling in your entire arm, especially when your wrist and elbow are bent for long periods of time.  Wrist pain that goes up your arm to your shoulder.  Pain that goes down into your palm or fingers.  A weak feeling in your hands. You may have trouble grabbing and holding items.  Your symptoms may feel worse during the night. How is this diagnosed? This condition is diagnosed with a medical history and physical exam. You may also have tests, including:  An electromyogram (EMG). This test measures electrical signals sent by your nerves into the muscles.  X-rays.  How is this treated? Treatment for this condition includes:  Lifestyle changes. It is important to stop doing or modify the activity that caused your condition.  Physical or occupational therapy.  Medicines for pain and inflammation.  This may include medicine that is injected into your wrist.  A wrist splint.  Surgery.  Follow these instructions at home: If you have a splint:  Wear it as told by your health care provider. Remove it only as told by your health care provider.  Loosen the splint if your fingers become numb and tingle, or if they turn cold and blue.  Keep the splint clean and dry. General instructions  Take over-the-counter and prescription medicines only as told by your health care provider.  Rest your wrist from any activity that may be causing your pain. If your condition is work related, talk to your employer about changes that can be made, such as getting a wrist pad to use while typing.  If directed, apply ice to the painful area: ? Put ice in a plastic bag. ? Place a towel between your skin and the bag. ? Leave the ice on for 20 minutes, 2-3 times per day.  Keep all follow-up visits as told by your health care provider. This is important.  Do any exercises as told by your health care provider, physical therapist, or occupational therapist. Contact a health care provider if:  You have new symptoms.  Your pain is not controlled with medicines.  Your symptoms get worse. This information is not intended to replace advice given to you by your health care provider. Make sure you discuss any questions you have with your health care provider. Document Released: 06/24/2000 Document Revised: 11/05/2015 Document Reviewed: 11/12/2014 Elsevier Interactive Patient Education  2018 [employer]vier Inc[employer]  

## 2017-09-14 NOTE — Progress Notes (Signed)
Subjective:  Patient ID: Mark Cohen, male    DOB: 14-Nov-1950  Age: 67 y.o. MRN: [id number]  CC: Hypertension; Osteoarthritis; and Back Pain   HPI SHUBHAM THACKSTON presents for f/up - He was recently seen in the ED for an episode of syncope.  He was sitting at breakfast starting to eat when he had the warning that he was going to pass out.  He had the same thing happen 15 years ago at which time he was diagnosed with low blood sugar and dehydration.  He thinks the same thing happened this time.  He has had no recurrence of syncope or presyncope.  He denies dizziness, lightheadedness, chest pain, or palpitations.  He complaints of chronic musculoskeletal pain, mild insomnia, and fatigue.  He complains of symptoms at night of carpal tunnel syndrome that include wrist pain and numbness and tingling in his fingers.  He recently had a NCS/EMG performed that revealed mild carpal tunnel syndrome.  Outpatient Medications Prior to Visit  Medication Sig Dispense Refill  . ALPRAZolam (XANAX) 0.25 MG tablet TAKE 1 TABLET BY MOUTH 2 TIMES DAILY AS NEEDED 35 tablet 1  . cholecalciferol (VITAMIN D) 1000 UNITS tablet Take 1,000 Units by mouth daily.      . fluticasone (FLONASE) 50 MCG/ACT nasal spray Place 2 sprays into the nose daily as needed for allergies.     Marland Kitchen HYDROcodone-acetaminophen (NORCO/VICODIN) 5-325 MG tablet Take 1 tablet by mouth every 6 (six) hours as needed. 65 tablet 0  . celecoxib (CELEBREX) 200 MG capsule Take 1 capsule (200 mg total) by mouth 2 (two) times daily. (Patient not taking: Reported on 09/08/2017) 90 capsule 3   No facility-administered medications prior to visit.     ROS Review of Systems  Constitutional: Negative.  Negative for appetite change, diaphoresis, fatigue and unexpected weight change.  HENT: Negative.  Negative for sinus pressure and trouble swallowing.   Eyes: Negative.   Respiratory: Negative.  Negative for cough, chest tightness, shortness of breath and  wheezing.   Cardiovascular: Negative for chest pain, palpitations and leg swelling.  Gastrointestinal: Negative for abdominal pain, constipation, diarrhea, nausea and vomiting.  Endocrine: Negative.   Genitourinary: Negative.  Negative for difficulty urinating and dysuria.  Musculoskeletal: Positive for arthralgias and back pain. Negative for myalgias and neck pain.  Skin: Negative for color change and pallor.  Allergic/Immunologic: Negative.   Neurological: Positive for numbness. Negative for dizziness, tremors, syncope and weakness.  Hematological: Negative for adenopathy. Does not bruise/bleed easily.  Psychiatric/Behavioral: Negative.     Objective:  BP 118/80 (BP Location: Left Arm, Patient Position: Sitting, Cuff Size: Normal)   Pulse (!) 56   Temp 98.8 F (37.1 C) (Oral)   Resp 16   Ht 6' (1.829 m)   Wt 175 lb 8 oz (79.6 kg)   SpO2 99%   BMI 23.80 kg/m   BP Readings from Last 3 Encounters:  09/14/17 118/80  09/08/17 117/78  06/22/17 130/80    Wt Readings from Last 3 Encounters:  09/14/17 175 lb 8 oz (79.6 kg)  09/08/17 170 lb (77.1 kg)  06/22/17 171 lb 12 oz (77.9 kg)    Physical Exam  Constitutional: He is oriented to person, place, and time. No distress.  HENT:  Mouth/Throat: Oropharynx is clear and moist. No oropharyngeal exudate.  Eyes: Conjunctivae are normal. Left eye exhibits no discharge. No scleral icterus.  Neck: Normal range of motion. Neck supple. No JVD present. No thyromegaly present.  Cardiovascular: Normal rate,  regular rhythm and normal heart sounds. Exam reveals no gallop.  No murmur heard. Pulmonary/Chest: Effort normal and breath sounds normal. No respiratory distress. He has no wheezes. He has no rales.  Abdominal: Soft. Bowel sounds are normal. He exhibits no distension and no mass. There is no tenderness. There is no guarding.  Musculoskeletal: Normal range of motion. He exhibits no edema, tenderness or deformity.  Lymphadenopathy:    He  has no cervical adenopathy.  Neurological: He is alert and oriented to person, place, and time.  Skin: Skin is warm and dry. He is not diaphoretic. No erythema. No pallor.  Vitals reviewed.   Lab Results  Component Value Date   WBC 7.0 09/08/2017   HGB 14.9 09/08/2017   HCT 42.3 09/08/2017   PLT 174 09/08/2017   GLUCOSE 88 09/14/2017   CHOL 159 01/20/2016   TRIG 34.0 01/20/2016   HDL 76.80 01/20/2016   LDLCALC 76 01/20/2016   ALT 28 02/09/2017   AST 32 02/09/2017   NA 137 09/14/2017   K 4.5 09/14/2017   CL 103 09/14/2017   CREATININE 0.91 09/14/2017   BUN 14 09/14/2017   CO2 28 09/14/2017   TSH 1.93 02/09/2017   PSA 0.14 04/25/2017   HGBA1C 5.5 09/14/2017    No results found.  Assessment & Plan:   Malaky was seen today for hypertension, osteoarthritis and back pain.  Diagnoses and all orders for this visit:  Hyperglycemia- Improvement noted -     Basic metabolic panel; Future -     Hemoglobin A1c; Future  Essential hypertension, benign- His blood pressure is well controlled.  Electrolytes and renal function are normal. -     Basic metabolic panel; Future  Left lumbar radiculitis -     Discontinue: HYDROcodone-acetaminophen (NORCO/VICODIN) 5-325 MG tablet; Take 1 tablet by mouth every 6 (six) hours as needed. -     Naproxen (NAPROXEN) 375 MG TBEC; Take 1 tablet (375 mg total) by mouth 2 (two) times daily with a meal. -     HYDROcodone-acetaminophen (NORCO/VICODIN) 5-325 MG tablet; Take 1 tablet by mouth every 6 (six) hours as needed.  Primary osteoarthritis involving multiple joints -     Discontinue: HYDROcodone-acetaminophen (NORCO/VICODIN) 5-325 MG tablet; Take 1 tablet by mouth every 6 (six) hours as needed. -     Naproxen (NAPROXEN) 375 MG TBEC; Take 1 tablet (375 mg total) by mouth 2 (two) times daily with a meal. -     HYDROcodone-acetaminophen (NORCO/VICODIN) 5-325 MG tablet; Take 1 tablet by mouth every 6 (six) hours as needed.  Carpal tunnel syndrome on  both sides -     Naproxen (NAPROXEN) 375 MG TBEC; Take 1 tablet (375 mg total) by mouth 2 (two) times daily with a meal. -     Ambulatory referral to Occupational Therapy   I have discontinued Mark Cohen "JOE"'s celecoxib. I am also having him start on Naproxen. Additionally, I am having him maintain his fluticasone, cholecalciferol, ALPRAZolam, and HYDROcodone-acetaminophen.  Meds ordered this encounter  Medications  . DISCONTD: HYDROcodone-acetaminophen (NORCO/VICODIN) 5-325 MG tablet    Sig: Take 1 tablet by mouth every 6 (six) hours as needed.    Dispense:  65 tablet    Refill:  0    Fill on or after 02/09/17  . Naproxen (NAPROXEN) 375 MG TBEC    Sig: Take 1 tablet (375 mg total) by mouth 2 (two) times daily with a meal.    Dispense:  180 each    Refill:  0  . HYDROcodone-acetaminophen (NORCO/VICODIN) 5-325 MG tablet    Sig: Take 1 tablet by mouth every 6 (six) hours as needed.    Dispense:  65 tablet    Refill:  0     Follow-up: Return in about 3 months (around 12/15/2017).  Scarlette Calico, MD

## 2017-09-15 ENCOUNTER — Telehealth: Payer: Self-pay | Admitting: Internal Medicine

## 2017-09-15 NOTE — Telephone Encounter (Unsigned)
Copied from [place]. Topic: Quick Communication - Rx Refill/Question >> Sep 15, 2017 10:12 AM Carolyn Stare wrote: RX was sent in yesterday and pharmacy said they did not received   Medication HYDROcodone-acetaminophen (NORCO/VICODIN) 5-325 MG tablet   Has the patient contacted their pharmacy yes    Preferred Pharmacy   [facility]    Agent: Please be advised that RX refills may take up to 3 business days. We ask that you follow-up with your pharmacy.

## 2017-09-16 MED ORDER — HYDROCODONE-ACETAMINOPHEN 5-325 MG PO TABS: 1.0000 | ORAL_TABLET | Freq: Four times a day (QID) | ORAL | 0 refills | Status: DC | PRN

## 2017-11-22 ENCOUNTER — Ambulatory Visit: Payer: Medicare Other | Attending: Internal Medicine | Admitting: *Deleted

## 2017-11-22 ENCOUNTER — Encounter: Payer: Self-pay | Admitting: *Deleted

## 2017-11-22 DIAGNOSIS — M79641 Pain in right hand: Secondary | ICD-10-CM | POA: Insufficient documentation

## 2017-11-22 DIAGNOSIS — M6281 Muscle weakness (generalized): Secondary | ICD-10-CM | POA: Insufficient documentation

## 2017-11-22 DIAGNOSIS — M79642 Pain in left hand: Secondary | ICD-10-CM | POA: Insufficient documentation

## 2017-11-22 DIAGNOSIS — R29818 Other symptoms and signs involving the nervous system: Secondary | ICD-10-CM | POA: Diagnosis not present

## 2017-11-22 DIAGNOSIS — R278 Other lack of coordination: Secondary | ICD-10-CM | POA: Insufficient documentation

## 2017-11-22 DIAGNOSIS — H518 Other specified disorders of binocular movement: Secondary | ICD-10-CM | POA: Diagnosis not present

## 2017-11-22 NOTE — Therapy (Signed)
[facility] [address] [place], [place], [address] Phone: [phone]   Fax:  [phone]  Occupational Therapy Evaluation  Patient Details  Name: Mark Cohen MRN: [id number] Date of Birth: [birth year] Referring Provider: Janith Lima, MD   Encounter Date: 11/22/2017  OT End of Session - 11/22/17 1213    Visit Number  1    Number of Visits  12    Date for OT Re-Evaluation  12/20/17    Authorization Type  Medicare part A & B; secondary:  [employer] MCR supplement    Authorization - Visit Number  1 Medicare     Authorization - Number of Visits  10    OT Start Time  1102    OT Stop Time  1202    OT Time Calculation (min)  60 min    Activity Tolerance  Patient tolerated treatment well    Behavior During Therapy  [facility] for tasks assessed/performed       Past Medical History:  Diagnosis Date  . Allergic rhinitis   . Colon polyps   . Epididymal cyst    right  . GERD (gastroesophageal reflux disease)   . Hepatitis 1961   hepatitis a as child, no liver oproblems since  . Hip pain    both    Past Surgical History:  Procedure Laterality Date  . APPENDECTOMY    . colonscopy  07/27/2016  . EPIDIDYMECTOMY  08/29/2002   Sees Dr Gaynelle Arabian twice a year left side  . EPIDIDYMECTOMY Right 09/09/2016   Procedure: EPIDIDYMECTOMY;  Surgeon: Carolan Clines, MD;  Location: [facility];  Service: Urology;  Laterality: Right;  . NASAL SINUS SURGERY  2008    There were no vitals filed for this visit.  Subjective Assessment - 11/22/17 1107    Subjective   Pt reports h/o bilateral hand pain. MD referred him for nerve conduction testing which revealed bilateral mild CTS.  Pt also reports hand/joint stiffness and R RF trigger finger which he sttaes began prior to November 2018.    Repetition  Increases Symptoms increased activity, yard work, wakes up at [employer]    Currently in Pain?  Yes    Pain Score  2     Pain Location   Finger (Comment which one) L RF. Trigger finger noted    Pain Orientation  Right    Pain Descriptors / Indicators  Aching;Other (Comment) Stiffness    Pain Type  Acute pain    Pain Onset  1 to 4 weeks ago    Pain Frequency  Intermittent    Multiple Pain Sites  No        [facility] OT Assessment - 11/22/17 0001      Assessment   Medical Diagnosis  Bilateral mild CTS    Referring Provider  Janith Lima, MD    Onset Date/Surgical Date  -- Nov 2018    Hand Dominance  Right    Next MD Visit  -- f/u PRN per pt report      Precautions   Precautions  None      Balance Screen   Has the patient fallen in the past 6 months  No    Has the patient had a decrease in activity level because of a fear of falling?   No    Is the patient reluctant to leave their home because of a fear of falling?   No      Home  Environment  Lives With  Spouse      Prior Function   Level of Independence  Independent Very active in yard/around house.    Vocation  Retired    Leisure  Works in yard, outside all the time, walks dog daily for 30+ min per day.      ADL   ADL comments  Pt reports independence with all ADL's, increased time for some tasks. Reports trigger finger with increased functonal use in yard (ie: trimming bushes, uses chain saw, clippers etc)      Written Expression   Dominant Hand  Right      Vision - History   Baseline Vision  Wears glasses all the time      Cognition   Overall Cognitive Status  Within Functional Limits for tasks assessed      Sensation   Light Touch  Appears Intact    Hot/Cold  Appears Intact      Coordination   Gross Motor Movements are Fluid and Coordinated  Yes    Fine Motor Movements are Fluid and Coordinated  Yes    Coordination and Movement Description  -- Pt reports intermittent paresthesias bilateral hands at time    9 Hole Peg Test  Right;Left    Right 9 Hole Peg Test  24.87 seconds    Left 9 Hole Peg Test  20.41 seconds    Other  Phalens test:  negative bilaterally  Tinels = negative bilaterally      ROM / Strength   AROM / PROM / Strength  AROM      AROM   Overall AROM   Within functional limits for tasks performed Triggering noted R RF. Reports morning joint stiffness bil h    Overall AROM Comments  Bilateral hands with AROM WFL's, able to make full composite fist. Reports morning finger and joint stiffness.      Hand Function   Right Hand Grip (lbs)  74#    Right Hand Lateral Pinch  19 lbs    Right Hand 3 Point Pinch  21 lbs    Left Hand Grip (lbs)  76#    Left Hand Lateral Pinch  20 lbs    Left 3 point pinch  21 lbs               OT Treatments/Exercises (OP) - 11/22/17 0001      Exercises   Exercises  Hand Tendon gliding, median nerve glides bilateral UE's      Modalities   Modalities  Cryotherapy Discussed use of ice at home for home program      Splinting   Splinting  Issued bilateral pre-fab wrist cock-up splints and educated pt in splinting use, care and precautions. He will intially wear these during the day/noc for next 7-10 days and then re-assess to see if he can decrease to Noc and PRN during the day. He verbalized understanding of this in clinic today.         Carpal Tunnel Syndrome 1)  Carpal tunnel syndrome is a condition that causes pain in your hand and arm. The carpal tunnel is a narrow area located on the palm side of your wrist. Repeated wrist motion or certain diseases may cause swelling within the tunnel. This swelling pinches the main nerve in the wrist (median nerve). What are the causes? This condition may be caused by:  Repeated wrist motions.  Wrist injuries.  Arthritis.  A cyst or tumor in the carpal tunnel.  Fluid buildup during pregnancy.  Sometimes the cause of this condition is not known. What increases the risk? This condition is more likely to develop in:  People who have jobs that cause them to repeatedly move their wrists in the same motion, such as Advertising copywriter.  Women.  People with certain conditions, such as: ? Diabetes. ? Obesity. ? An underactive thyroid (hypothyroidism). ? Kidney failure.  What are the signs or symptoms? Symptoms of this condition include:  A tingling feeling in your fingers, especially in your thumb, index, and middle fingers.  Tingling or numbness in your hand.  An aching feeling in your entire arm, especially when your wrist and elbow are bent for long periods of time.  Wrist pain that goes up your arm to your shoulder.  Pain that goes down into your palm or fingers.  A weak feeling in your hands. You may have trouble grabbing and holding items.  Your symptoms may feel worse during the night. How is this diagnosed? This condition is diagnosed with a medical history and physical exam. You may also have tests, including:  An electromyogram (EMG). This test measures electrical signals sent by your nerves into the muscles.  X-rays.  How is this treated? Treatment for this condition includes:  Lifestyle changes. It is important to stop doing or modify the activity that caused your condition.  Physical or occupational therapy.  Medicines for pain and inflammation. This may include medicine that is injected into your wrist.  A wrist splint.  Surgery.  Follow these instructions at home: If you have a splint:  Wear it as told by your health care provider. Remove it only as told by your health care provider.  Loosen the splint if your fingers become numb and tingle, or if they turn cold and blue.  Keep the splint clean and dry. General instructions  Take over-the-counter and prescription medicines only as told by your health care provider.  Rest your wrist from any activity that may be causing your pain. If your condition is work related, talk to your employer about changes that can be made, such as getting a wrist pad to use while typing.  If directed, apply ice to the painful  area: ? Put ice in a plastic bag. ? Place a towel between your skin and the bag. ? Leave the ice on for 20 minutes, 2-3 times per day.  Keep all follow-up visits as told by your health care provider. This is important.  Do any exercises as told by your health care provider, physical therapist, or occupational therapist. Contact a health care provider if:  You have new symptoms.  Your pain is not controlled with medicines.  Your symptoms get worse. This information is not intended to replace advice given to you by your health care provider. Make sure you discuss any questions you have with your health care provider. Document Released: 06/24/2000 Document Revised: 11/05/2015 Document Reviewed: 11/12/2014 Elsevier Interactive Patient Education  2018 [employer].   2) Do median nerve gliding exercises.  3) Do tendon gliding exercises (decrease or don't do these with increased triggering of your right ring finger or if you have increased carpal tunnel symptoms/numbness).  4) Use ice and splints during the day and at night for next 1-2 weeks, then re assess with therapist.      OT Education - 11/22/17 1211    Education provided  Yes    Education Details  Assessment findings and recommendations, pre-fab wrist cock-up splints use, care and precautions.  Cryotherapy, median nerve gliding and tendon glides.    Person(s) Educated  Patient    Methods  Explanation;Demonstration;Handout    Comprehension  Verbalized understanding;Need further instruction       OT Short Term Goals - 11/22/17 1233      OT SHORT TERM GOAL #1   Title  Pt will be I splint use, care and precautions    Time  3    Period  Weeks    Status  New    Target Date  12/13/17      OT SHORT TERM GOAL #2   Title  Pt will be Mod I median nerve gliding techniques bilateral UE's    Time  3    Period  Weeks    Status  New    Target Date  12/13/17      OT SHORT TERM GOAL #3   Title  Pt will be Mod I HEP and neutral  wrist for functional activity    Time  3    Period  Weeks    Status  New    Target Date  12/13/17        OT Long Term Goals - 11/22/17 1235      OT LONG TERM GOAL #1   Title  Pt will be Mod I updated HEP bilateral UE    Time  6    Period  Weeks    Status  New    Target Date  01/03/18      OT LONG TERM GOAL #2   Title  Pt will be Mod I splint use, care and precautions bilateral UE's    Time  6    Period  Weeks    Status  New    Target Date  01/03/18      OT LONG TERM GOAL #3   Title  Pt will report pain in bilateral hands a less than 2/10 following yardwork and caring for his dog    Time  6    Period  Weeks    Status  New    Target Date  01/03/18      OT LONG TERM GOAL #4   Title  Pt will demonstrate increased coordination R hand as seen by decreased 9 Hole Peg Score by 5 seconds or more    Baseline  See Eval 11/22/17    Time  6    Period  Weeks    Status  New    Target Date  01/03/18      OT LONG TERM GOAL #5   Title  Pt will be Mod I activity modification techniques secondary to bilateral CTS and Trigger Finger R RF    Time  6    Period  Weeks    Status  New    Target Date  01/03/18            Plan - 11/22/17 1215    Clinical Impression Statement  Pt is a plesant 67 y/o R HD male with PMH including HTN, osteoarthritis, back pain whom was referred today by Dr Janith Lima with a dx: bilateral mild CTS. Pt reports that he was given Naproxen by Dr Ronnald Ramp, but "didn't notice a difference (in his symptoms) so I don't take it". Pt also states that he had nerve conduction studies in March 2019 at which time he was referred to out-pt OT secondary to bilaterl mild CTS. Pt states that he initially decided not to come to therapy in hopes  that his symptoms would "go away" on their own. He is very active and enjoys yard work, Tour manager.  He reports that his symptoms began around Nov 2018 and that they tend to "come and go" or fluctuate dependening on his level of  activity. He is positive for noc paresthesias R > L hand in median nerve distribution of his hands. He also reports paresthesias in digits 1-3 bilaterally during the day after doing yard work. He was negative to [place] bilaterally in clinic today. He is positive for triggering of his right RF. He also reports symtpoms of aching at times in his flexor muscle mass expecially after increased functional activity in the yard. He should benefit from pt education, splinting, median nerve gliding and modalities such as cryotherpy, iontophoresis &/or pulsed U.S. PRN to assist with decreasing symptoms of bil CTS and R RF Trigger finger. He was fitted with bilateral pre-fab wrist cock-up splints today and instructed in median nerve glides, cryotherapy and neutral wrist for functional activities was broached in clinic today.     Occupational performance deficits (Please refer to evaluation for details):  ADL's;IADL's;Leisure    Rehab Potential  Good    OT Frequency  2x / week    OT Duration  6 weeks    OT Treatment/Interventions  Self-care/ADL training;Iontophoresis;Therapeutic exercise;Splinting;Patient/family education;Therapeutic activities;Cryotherapy;Ultrasound;DME and/or AE instruction    Plan  Check bilateral splint use, ?? consider Iontophoresis vs pulsed U.S bilateral CTS & R RF Trigger finger, address trigger finger R RF, pt education neutral wrist.    Clinical Decision Making  Several treatment options, min-mod task modification necessary    Consulted and Agree with Plan of Care  Patient       Patient will benefit from skilled therapeutic intervention in order to improve the following deficits and impairments:  Pain, Decreased coordination, Impaired sensation, Decreased activity tolerance, Decreased endurance, Decreased range of motion, Decreased knowledge of precautions, Impaired UE functional use  Visit Diagnosis: Pain in left hand - Plan: Ot plan of care cert/re-cert  Pain in right hand  - Plan: Ot plan of care cert/re-cert  Other symptoms and signs involving the nervous system - Plan: Ot plan of care cert/re-cert  Other lack of coordination - Plan: Ot plan of care cert/re-cert  Muscle weakness (generalized) - Plan: Ot plan of care cert/re-cert    Problem List Patient Active Problem List   Diagnosis Date Noted  . Carpal tunnel syndrome on both sides 06/22/2017  . Hypogonadism male 02/09/2017  . Hyperlipidemia with target LDL less than 130 01/14/2016  . GAD (generalized anxiety disorder) 01/21/2015  . DJD (degenerative joint disease), multiple sites 05/10/2013  . Hyperglycemia 08/12/2011  . Left lumbar radiculitis 08/12/2011  . Essential hypertension, benign 08/12/2011  . Routine general medical examination at a health care facility 04/28/2011  . ALLERGIC RHINITIS 01/29/2010  . GERD 01/29/2010    Almyra Deforest, OTR/L 11/22/2017, 12:49 PM  [facility] [address] [place], [place], [address] Phone: [phone]   Fax:  [phone]  Name: CELSO GRANJA MRN: [id number] Date of Birth: March 01, 1951

## 2017-11-22 NOTE — Patient Instructions (Addendum)
Carpal Tunnel Syndrome 1)  Carpal tunnel syndrome is a condition that causes pain in your hand and arm. The carpal tunnel is a narrow area located on the palm side of your wrist. Repeated wrist motion or certain diseases may cause swelling within the tunnel. This swelling pinches the main nerve in the wrist (median nerve). What are the causes? This condition may be caused by:  Repeated wrist motions.  Wrist injuries.  Arthritis.  A cyst or tumor in the carpal tunnel.  Fluid buildup during pregnancy.  Sometimes the cause of this condition is not known. What increases the risk? This condition is more likely to develop in:  People who have jobs that cause them to repeatedly move their wrists in the same motion, such as Art gallery manager.  Women.  People with certain conditions, such as: ? Diabetes. ? Obesity. ? An underactive thyroid (hypothyroidism). ? Kidney failure.  What are the signs or symptoms? Symptoms of this condition include:  A tingling feeling in your fingers, especially in your thumb, index, and middle fingers.  Tingling or numbness in your hand.  An aching feeling in your entire arm, especially when your wrist and elbow are bent for long periods of time.  Wrist pain that goes up your arm to your shoulder.  Pain that goes down into your palm or fingers.  A weak feeling in your hands. You may have trouble grabbing and holding items.  Your symptoms may feel worse during the night. How is this diagnosed? This condition is diagnosed with a medical history and physical exam. You may also have tests, including:  An electromyogram (EMG). This test measures electrical signals sent by your nerves into the muscles.  X-rays.  How is this treated? Treatment for this condition includes:  Lifestyle changes. It is important to stop doing or modify the activity that caused your condition.  Physical or occupational therapy.  Medicines for pain and  inflammation. This may include medicine that is injected into your wrist.  A wrist splint.  Surgery.  Follow these instructions at home: If you have a splint:  Wear it as told by your health care provider. Remove it only as told by your health care provider.  Loosen the splint if your fingers become numb and tingle, or if they turn cold and blue.  Keep the splint clean and dry. General instructions  Take over-the-counter and prescription medicines only as told by your health care provider.  Rest your wrist from any activity that may be causing your pain. If your condition is work related, talk to your employer about changes that can be made, such as getting a wrist pad to use while typing.  If directed, apply ice to the painful area: ? Put ice in a plastic bag. ? Place a towel between your skin and the bag. ? Leave the ice on for 20 minutes, 2-3 times per day.  Keep all follow-up visits as told by your health care provider. This is important.  Do any exercises as told by your health care provider, physical therapist, or occupational therapist. Contact a health care provider if:  You have new symptoms.  Your pain is not controlled with medicines.  Your symptoms get worse. This information is not intended to replace advice given to you by your health care provider. Make sure you discuss any questions you have with your health care provider. Document Released: 06/24/2000 Document Revised: 11/05/2015 Document Reviewed: 11/12/2014 Elsevier Interactive Patient Education  [employer].  2) Do median nerve gliding exercises.  3) Do tendon gliding exercises (decrease or don't do these with increased triggering of your right ring finger or if you have increased carpal tunnel symptoms/numbness).  4) Use ice and splints during the day and at night for next 1-2 weeks, then re assess with therapist.

## 2017-11-29 ENCOUNTER — Ambulatory Visit: Payer: Medicare Other | Admitting: *Deleted

## 2017-12-05 ENCOUNTER — Encounter: Payer: Medicare Other | Admitting: Occupational Therapy

## 2017-12-06 ENCOUNTER — Encounter: Payer: Medicare Other | Admitting: *Deleted

## 2017-12-07 ENCOUNTER — Encounter

## 2017-12-08 DIAGNOSIS — H43811 Vitreous degeneration, right eye: Secondary | ICD-10-CM | POA: Diagnosis not present

## 2018-01-18 DIAGNOSIS — H93293 Other abnormal auditory perceptions, bilateral: Secondary | ICD-10-CM | POA: Diagnosis not present

## 2018-02-02 DIAGNOSIS — D229 Melanocytic nevi, unspecified: Secondary | ICD-10-CM | POA: Diagnosis not present

## 2018-02-02 DIAGNOSIS — L821 Other seborrheic keratosis: Secondary | ICD-10-CM | POA: Diagnosis not present

## 2018-04-05 ENCOUNTER — Ambulatory Visit (INDEPENDENT_AMBULATORY_CARE_PROVIDER_SITE_OTHER): Payer: Medicare Other

## 2018-04-05 ENCOUNTER — Ambulatory Visit (INDEPENDENT_AMBULATORY_CARE_PROVIDER_SITE_OTHER): Payer: Medicare Other | Admitting: Podiatry

## 2018-04-05 VITALS — BP 132/86 | HR 79

## 2018-04-05 DIAGNOSIS — M722 Plantar fascial fibromatosis: Secondary | ICD-10-CM

## 2018-04-05 DIAGNOSIS — M84375A Stress fracture, left foot, initial encounter for fracture: Secondary | ICD-10-CM | POA: Diagnosis not present

## 2018-04-05 DIAGNOSIS — M79672 Pain in left foot: Secondary | ICD-10-CM

## 2018-04-05 NOTE — Progress Notes (Signed)
Subjective:   Patient ID: Mark Cohen, male   DOB: 67 y.o.   MRN: [id number]   HPI 67 year old male presents the office today for concerns of pain as well as a bruise to the top of the left foot which is been on the last 4 days.  He states he has an chronic heel pain for the last 6 months he is been walking differently putting more stress to this area.  He states he said he developed a large bruise as well as on top of the foot.  He denies any specific injury that he can recall to this area.  He said no recent treatment for this.  He has no other concerns.   Review of Systems  All other systems reviewed and are negative.  Past Medical History:  Diagnosis Date  . Allergic rhinitis   . Colon polyps   . Epididymal cyst    right  . GERD (gastroesophageal reflux disease)   . Hepatitis 1961   hepatitis a as child, no liver oproblems since  . Hip pain    both    Past Surgical History:  Procedure Laterality Date  . APPENDECTOMY    . colonscopy  07/27/2016  . EPIDIDYMECTOMY  08/29/2002   Sees Dr Gaynelle Arabian twice a year left side  . EPIDIDYMECTOMY Right 09/09/2016   Procedure: EPIDIDYMECTOMY;  Surgeon: Carolan Clines, MD;  Location: [facility];  Service: Urology;  Laterality: Right;  . NASAL SINUS SURGERY  2008     Current Outpatient Medications:  .  ALPRAZolam (XANAX) 0.25 MG tablet, TAKE 1 TABLET BY MOUTH 2 TIMES DAILY AS NEEDED, Disp: 35 tablet, Rfl: 1 .  cholecalciferol (VITAMIN D) 1000 UNITS tablet, Take 1,000 Units by mouth daily.  , Disp: , Rfl:  .  fluticasone (FLONASE) 50 MCG/ACT nasal spray, Place 2 sprays into the nose daily as needed for allergies. , Disp: , Rfl:  .  HYDROcodone-acetaminophen (NORCO/VICODIN) 5-325 MG tablet, Take 1 tablet by mouth every 6 (six) hours as needed., Disp: 65 tablet, Rfl: 0 .  Naproxen (NAPROXEN) 375 MG TBEC, Take 1 tablet (375 mg total) by mouth 2 (two) times daily with a meal. (Patient not taking: Reported on 11/22/2017), Disp: 180 each,  Rfl: 0  No Known Allergies       Objective:  Physical Exam  General: AAO x3, NAD  Dermatological: Skin is warm, dry and supple bilateral. Nails x 10 are well manicured; remaining integument appears unremarkable at this time. There are no open sores, no preulcerative lesions, no rash or signs of infection present.  Vascular: Dorsalis Pedis artery and Posterior Tibial artery pedal pulses are 2/4 bilateral with immedate capillary fill time.There is no pain with calf compression, swelling, warmth, erythema.   Neruologic: Grossly intact via light touch bilateral. Protective threshold with Semmes Wienstein monofilament intact to all pedal sites bilateral.   Musculoskeletal: There is tenderness of the loss of the metatarsal base as well as the third metatarsal shaft the left foot.  There is a large bruise as well as swelling to the dorsal aspect of the midfoot over the area of tenderness.  There is some mild tenderness on the plantar medial tubercle of the calcaneus at the insertion of plantar fashion.  Plantar fascia appears to be intact.  Achilles tendon intact but any pain.  No other areas of tenderness.  Muscular strength 5/5 in all groups tested bilateral.  Gait: Unassisted, Nonantalgic.      Assessment:   67 year old male with concern  for stress fracture second metatarsal base, chronic plantar fasciitis/heel pain    Plan:  -Treatment options discussed including all alternatives, risks, and complications -Etiology of symptoms were discussed -X-rays were obtained and reviewed with the patient.  On the AP view there is some radiolucencies present on the basis of metatarsal and given the area distal presenting pinpoint tenderness and concern for stress fracture also given the swelling and bruising to the top of the foot.  No other fracture identified at this time. -I encouraged the cam boot today.  Apparently he refused this today although he agrees with the treatment I discussed this with  him.  I try to go back to speak to him regarding this and he can try a surgical shoe but he Artie left the office.  We will try to contact him.  Encouraged ice elevation.  Once this pain the dorsal aspect the foot resolved discussed with him starting rehab and different treatment options for the plantar fasciitis and chronic heel pain.  Return in about 2 weeks (around 04/19/2018).  Repeat x-rays.  Trula Slade DPM

## 2018-04-06 ENCOUNTER — Telehealth: Payer: Self-pay | Admitting: *Deleted

## 2018-04-06 ENCOUNTER — Other Ambulatory Visit: Payer: Self-pay | Admitting: Podiatry

## 2018-04-06 DIAGNOSIS — M722 Plantar fascial fibromatosis: Secondary | ICD-10-CM

## 2018-04-06 NOTE — Telephone Encounter (Signed)
Called and left a message for the patient to call me back and I was calling to check on the patient and two to see if the patient would be willing to try a surgical shoe since the patient declined the air fracture walker and that I would be out of the office after 2:30 today and can call me back on Monday at [phone]. Lattie Haw

## 2018-04-10 ENCOUNTER — Telehealth: Payer: Self-pay | Admitting: Podiatry

## 2018-04-10 NOTE — Telephone Encounter (Signed)
Patient wanted to speak with you about the surgical shoe, that you called him about. He realizes he declined the boot, but he is interested in the shoe

## 2018-04-13 NOTE — Telephone Encounter (Signed)
Called and spoke with the patient and the patient will be by Monday to get a surgical shoe. Mark Cohen

## 2018-04-16 ENCOUNTER — Telehealth: Payer: Self-pay | Admitting: *Deleted

## 2018-04-16 NOTE — Telephone Encounter (Signed)
Called patient on Friday 04/13/18 and stated that we could try a surgical shoe and the patient stated that would be fine and he would be by on Monday 04/16/18 to come and look at it and try to see if that would help. Mark Cohen

## 2018-04-17 IMAGING — DX DG CHEST 2V
2 series · 2 of 2 positions shown · non-contrast
Comparison: Chest x-ray dated 02/03/2010.

CLINICAL DATA: Cough, congestion, fever for 6 days.

EXAM:
CHEST  2 VIEW

[chest pa]
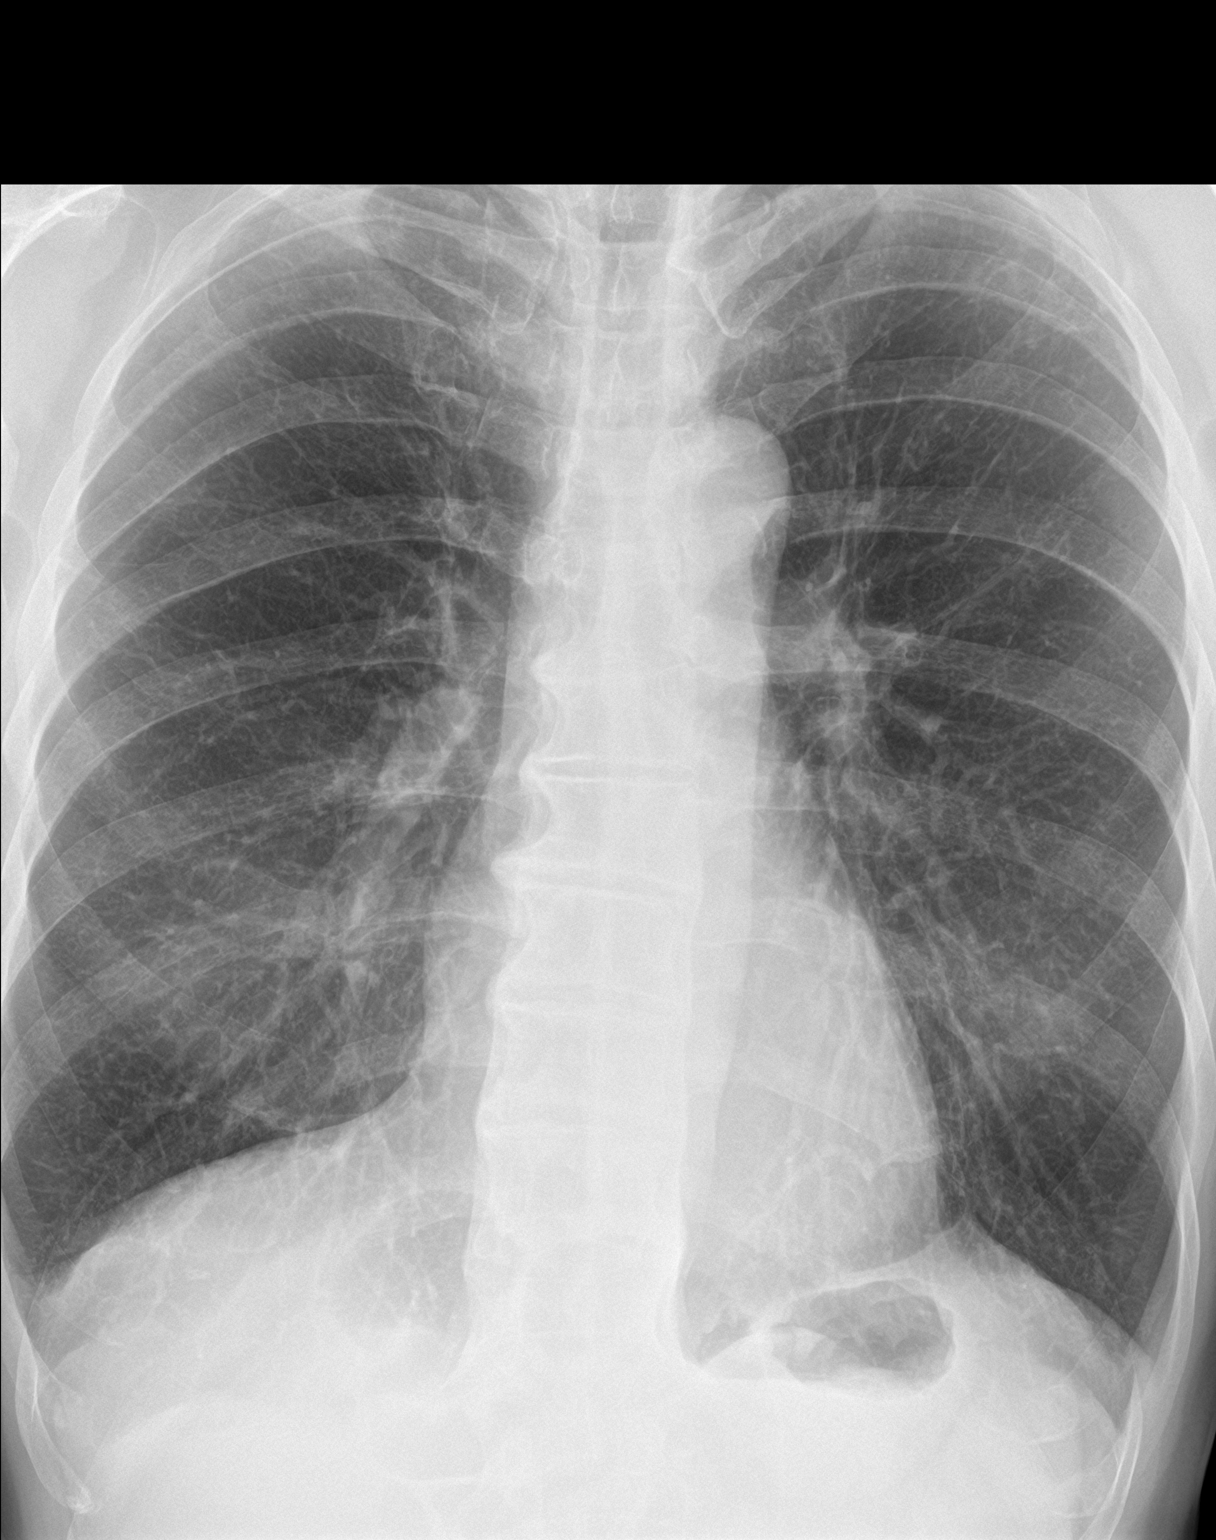

[chest lat]
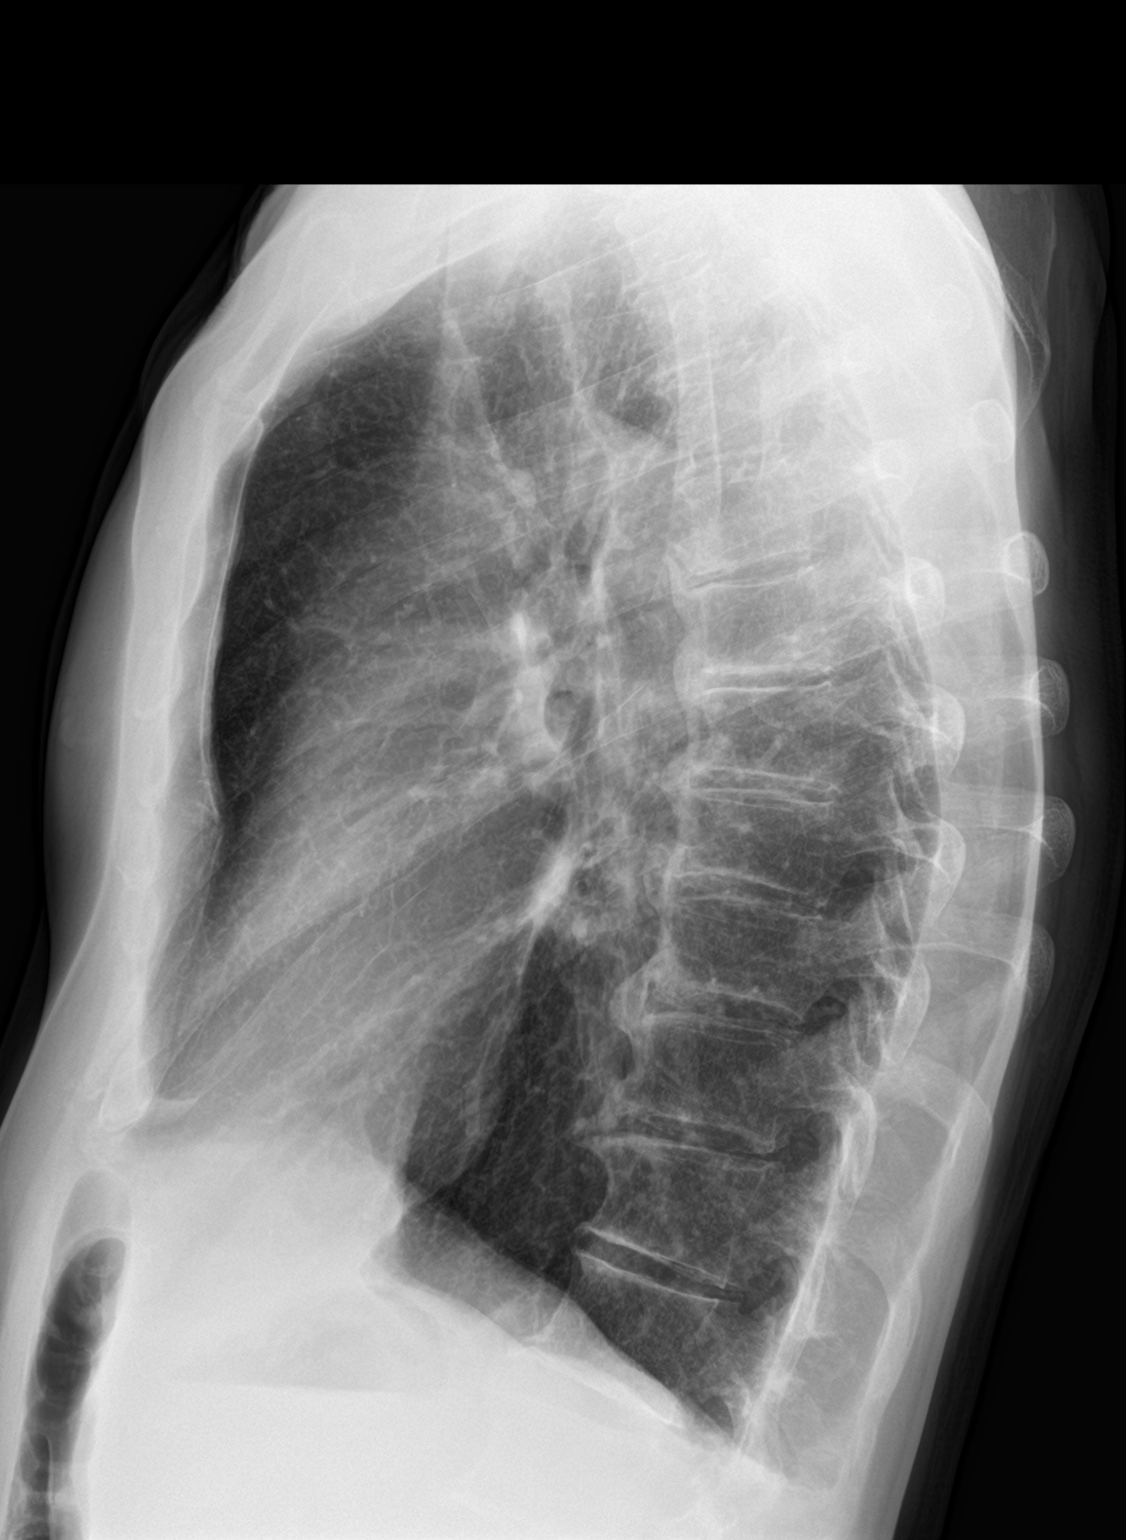

[2 of 2 positions shown; findings below may reference images not displayed]

FINDINGS: Heart size and mediastinal contours are within normal limits.
Atherosclerotic changes noted at the aortic arch.

Lungs are hyperexpanded suggesting COPD. Associated chronic
bronchitic changes centrally. Lungs are clear. No pleural effusion
or pneumothorax seen.

Degenerative spurring noted within the thoracic spine. No acute or
suspicious osseous finding.
IMPRESSION: 1. No active cardiopulmonary disease. No evidence of pneumonia or
pulmonary edema.
2. Hyperexpanded lungs suggesting COPD/emphysema. Associated chronic
bronchitic changes.
3. Aortic atherosclerosis.

## 2018-04-24 ENCOUNTER — Other Ambulatory Visit (INDEPENDENT_AMBULATORY_CARE_PROVIDER_SITE_OTHER): Payer: Medicare Other

## 2018-04-24 ENCOUNTER — Encounter: Payer: Self-pay | Admitting: Internal Medicine

## 2018-04-24 ENCOUNTER — Ambulatory Visit (INDEPENDENT_AMBULATORY_CARE_PROVIDER_SITE_OTHER): Payer: Medicare Other | Admitting: Internal Medicine

## 2018-04-24 VITALS — BP 142/86 | HR 61 | Temp 98.3°F | Resp 16 | Ht 72.0 in | Wt 170.8 lb

## 2018-04-24 DIAGNOSIS — E559 Vitamin D deficiency, unspecified: Secondary | ICD-10-CM

## 2018-04-24 DIAGNOSIS — M5416 Radiculopathy, lumbar region: Secondary | ICD-10-CM | POA: Diagnosis not present

## 2018-04-24 DIAGNOSIS — M150 Primary generalized (osteo)arthritis: Secondary | ICD-10-CM

## 2018-04-24 DIAGNOSIS — Z0000 Encounter for general adult medical examination without abnormal findings: Secondary | ICD-10-CM

## 2018-04-24 DIAGNOSIS — I10 Essential (primary) hypertension: Secondary | ICD-10-CM

## 2018-04-24 DIAGNOSIS — N400 Enlarged prostate without lower urinary tract symptoms: Secondary | ICD-10-CM

## 2018-04-24 DIAGNOSIS — F411 Generalized anxiety disorder: Secondary | ICD-10-CM

## 2018-04-24 DIAGNOSIS — M159 Polyosteoarthritis, unspecified: Secondary | ICD-10-CM

## 2018-04-24 DIAGNOSIS — E785 Hyperlipidemia, unspecified: Secondary | ICD-10-CM

## 2018-04-24 DIAGNOSIS — Z23 Encounter for immunization: Secondary | ICD-10-CM

## 2018-04-24 DIAGNOSIS — M8949 Other hypertrophic osteoarthropathy, multiple sites: Secondary | ICD-10-CM

## 2018-04-24 LAB — CBC WITH DIFFERENTIAL/PLATELET
BASOS PCT: 0.4 % (ref 0.0–3.0)
Basophils Absolute: 0 10*3/uL (ref 0.0–0.1)
EOS PCT: 2.2 % (ref 0.0–5.0)
Eosinophils Absolute: 0.1 10*3/uL (ref 0.0–0.7)
HCT: 44.6 % (ref 39.0–52.0)
Hemoglobin: 15.4 g/dL (ref 13.0–17.0)
LYMPHS ABS: 1.5 10*3/uL (ref 0.7–4.0)
Lymphocytes Relative: 26 % (ref 12.0–46.0)
MCHC: 34.6 g/dL (ref 30.0–36.0)
MCV: 92.2 fl (ref 78.0–100.0)
MONO ABS: 0.4 10*3/uL (ref 0.1–1.0)
Monocytes Relative: 6.5 % (ref 3.0–12.0)
NEUTROS ABS: 3.6 10*3/uL (ref 1.4–7.7)
NEUTROS PCT: 64.9 % (ref 43.0–77.0)
PLATELETS: 201 10*3/uL (ref 150.0–400.0)
RBC: 4.84 Mil/uL (ref 4.22–5.81)
RDW: 13 % (ref 11.5–15.5)
WBC: 5.6 10*3/uL (ref 4.0–10.5)

## 2018-04-24 LAB — COMPREHENSIVE METABOLIC PANEL
ALK PHOS: 76 U/L (ref 39–117)
ALT: 16 U/L (ref 0–53)
AST: 20 U/L (ref 0–37)
Albumin: 4.8 g/dL (ref 3.5–5.2)
BUN: 11 mg/dL (ref 6–23)
CO2: 32 mEq/L (ref 19–32)
Calcium: 10 mg/dL (ref 8.4–10.5)
Chloride: 102 mEq/L (ref 96–112)
Creatinine, Ser: 0.78 mg/dL (ref 0.40–1.50)
GFR: 105.27 mL/min (ref >60.00)
Glucose, Bld: 100 mg/dL — ABNORMAL HIGH (ref 70–99)
POTASSIUM: 4.4 meq/L (ref 3.5–5.1)
Sodium: 139 mEq/L (ref 135–145)
TOTAL PROTEIN: 6.9 g/dL (ref 6.0–8.3)
Total Bilirubin: 0.8 mg/dL (ref 0.2–1.2)

## 2018-04-24 LAB — PSA: PSA: 0.16 ng/mL (ref 0.10–4.00)

## 2018-04-24 LAB — LIPID PANEL
Cholesterol: 194 mg/dL (ref 0–200)
HDL: 86.8 mg/dL (ref >39.00)
LDL Cholesterol: 94 mg/dL (ref 0–99)
NONHDL: 107.32
TRIGLYCERIDES: 69 mg/dL (ref 0.0–149.0)
Total CHOL/HDL Ratio: 2
VLDL: 13.8 mg/dL (ref 0.0–40.0)

## 2018-04-24 LAB — URINALYSIS, ROUTINE W REFLEX MICROSCOPIC
Bilirubin Urine: NEGATIVE
Hgb urine dipstick: NEGATIVE
KETONES UR: NEGATIVE
Leukocytes, UA: NEGATIVE
Nitrite: NEGATIVE
PH: 6 (ref 5.0–8.0)
TOTAL PROTEIN, URINE-UPE24: NEGATIVE
URINE GLUCOSE: NEGATIVE
Urobilinogen, UA: 0.2 (ref 0.0–1.0)

## 2018-04-24 LAB — VITAMIN D 25 HYDROXY (VIT D DEFICIENCY, FRACTURES): VITD: 37.22 ng/mL (ref 30.00–100.00)

## 2018-04-24 LAB — TSH: TSH: 3.52 u[IU]/mL (ref 0.35–4.50)

## 2018-04-24 MED ORDER — HYDROCODONE-ACETAMINOPHEN 5-325 MG PO TABS: 1.0000 | ORAL_TABLET | Freq: Four times a day (QID) | ORAL | 0 refills | Status: DC | PRN

## 2018-04-24 MED ORDER — ALPRAZOLAM 0.25 MG PO TABS: ORAL_TABLET | ORAL | 2 refills | Status: DC

## 2018-04-24 NOTE — Patient Instructions (Signed)

## 2018-04-24 NOTE — Progress Notes (Signed)
Subjective:  Patient ID: Mark Cohen, male    DOB: 02-09-1951  Age: 67 y.o. MRN: [id number]  CC: Hypertension and Annual Exam   HPI Mark Cohen presents for a CPX.  He complains of chronic, unchanged pain.  He tells me the pain keeps him awake at night.  He requests a refill on hydrocodone and acetaminophen.  He also complains of chronic anxiety and wants a refill of Xanax.  He denies any recent episodes of CP, DOE, palpitations, edema, or fatigue.  Past Medical History:  Diagnosis Date  . Allergic rhinitis   . Colon polyps   . Epididymal cyst    right  . GERD (gastroesophageal reflux disease)   . Hepatitis 1961   hepatitis a as child, no liver oproblems since  . Hip pain    both   Past Surgical History:  Procedure Laterality Date  . APPENDECTOMY    . colonscopy  07/27/2016  . EPIDIDYMECTOMY  08/29/2002   Sees Dr Gaynelle Arabian twice a year left side  . EPIDIDYMECTOMY Right 09/09/2016   Procedure: EPIDIDYMECTOMY;  Surgeon: Carolan Clines, MD;  Location: [facility];  Service: Urology;  Laterality: Right;  . NASAL SINUS SURGERY  2008    reports that he has quit smoking. His smoking use included cigarettes. He has a 7.00 pack-year smoking history. He has never used smokeless tobacco. He reports that he drinks alcohol. He reports that he does not use drugs. family history includes Arthritis in his unknown relative; Stroke in his unknown relative. No Known Allergies  Outpatient Medications Prior to Visit  Medication Sig Dispense Refill  . cholecalciferol (VITAMIN D) 1000 UNITS tablet Take 1,000 Units by mouth daily.      . fluticasone (FLONASE) 50 MCG/ACT nasal spray Place 2 sprays into the nose daily as needed for allergies.     . Naproxen (NAPROXEN) 375 MG TBEC Take 1 tablet (375 mg total) by mouth 2 (two) times daily with a meal. 180 each 0  . ALPRAZolam (XANAX) 0.25 MG tablet TAKE 1 TABLET BY MOUTH 2 TIMES DAILY AS NEEDED 35 tablet 1  . HYDROcodone-acetaminophen  (NORCO/VICODIN) 5-325 MG tablet Take 1 tablet by mouth every 6 (six) hours as needed. 65 tablet 0   No facility-administered medications prior to visit.     ROS Review of Systems  Constitutional: Negative.  Negative for fatigue and unexpected weight change.  HENT: Negative.   Eyes: Negative for visual disturbance.  Respiratory: Negative for cough, chest tightness, shortness of breath and wheezing.   Cardiovascular: Negative for chest pain, palpitations and leg swelling.  Gastrointestinal: Negative for abdominal pain, constipation, diarrhea, nausea and vomiting.  Endocrine: Negative.   Genitourinary: Negative.  Negative for difficulty urinating, dysuria, penile swelling, scrotal swelling, testicular pain and urgency.  Musculoskeletal: Positive for arthralgias and back pain. Negative for myalgias and neck pain.  Skin: Negative.  Negative for color change.  Neurological: Negative.  Negative for dizziness, weakness, light-headedness and headaches.  Hematological: Negative for adenopathy. Does not bruise/bleed easily.  Psychiatric/Behavioral: Negative for decreased concentration, dysphoric mood, sleep disturbance and suicidal ideas. The patient is nervous/anxious.     Objective:  BP (!) 142/86 (BP Location: Left Arm, Patient Position: Sitting, Cuff Size: Normal)   Pulse 61   Temp 98.3 F (36.8 C) (Oral)   Resp 16   Ht 6' (1.829 m)   Wt 170 lb 12 oz (77.5 kg)   SpO2 96%   BMI 23.16 kg/m   BP Readings from Last 3  Encounters:  04/24/18 (!) 142/86  04/05/18 132/86  09/14/17 118/80    Wt Readings from Last 3 Encounters:  04/24/18 170 lb 12 oz (77.5 kg)  09/14/17 175 lb 8 oz (79.6 kg)  09/08/17 170 lb (77.1 kg)    Physical Exam  Constitutional: He is oriented to person, place, and time. No distress.  HENT:  Mouth/Throat: Oropharynx is clear and moist. No oropharyngeal exudate.  Eyes: Conjunctivae are normal. No scleral icterus.  Neck: Normal range of motion. Neck supple. No  JVD present. No thyromegaly present.  Cardiovascular: Normal rate and regular rhythm. Exam reveals no gallop.  No murmur heard. Pulmonary/Chest: Effort normal and breath sounds normal. No respiratory distress. He has no wheezes. He has no rales.  Abdominal: Soft. Normal appearance and bowel sounds are normal. He exhibits no mass. There is no hepatosplenomegaly. There is no tenderness. Hernia confirmed negative in the right inguinal area and confirmed negative in the left inguinal area.  Genitourinary: Rectum normal, testes normal and penis normal. Rectal exam shows no external hemorrhoid, no internal hemorrhoid, no fissure, no mass, no tenderness, anal tone normal and guaiac negative stool. Prostate is enlarged (1+ smooth symm BPH). Prostate is not tender. Right testis shows no mass, no swelling and no tenderness. Left testis shows no mass, no swelling and no tenderness. Circumcised. No penile erythema or penile tenderness. No discharge found.  Genitourinary Comments: Left hemiscrotum is empty  Musculoskeletal: Normal range of motion. He exhibits no edema, tenderness or deformity.  Lymphadenopathy:    He has no cervical adenopathy. No inguinal adenopathy noted on the right or left side.  Neurological: He is alert and oriented to person, place, and time.  Skin: Skin is warm and dry. No rash noted. He is not diaphoretic.  Psychiatric: He has a normal mood and affect. His behavior is normal. Judgment and thought content normal.  Vitals reviewed.   Lab Results  Component Value Date   WBC 5.6 04/24/2018   HGB 15.4 04/24/2018   HCT 44.6 04/24/2018   PLT 201.0 04/24/2018   GLUCOSE 100 (H) 04/24/2018   CHOL 194 04/24/2018   TRIG 69.0 04/24/2018   HDL 86.80 04/24/2018   LDLCALC 94 04/24/2018   ALT 16 04/24/2018   AST 20 04/24/2018   NA 139 04/24/2018   K 4.4 04/24/2018   CL 102 04/24/2018   CREATININE 0.78 04/24/2018   BUN 11 04/24/2018   CO2 32 04/24/2018   TSH 3.52 04/24/2018   PSA 0.16  04/24/2018   HGBA1C 5.5 09/14/2017    No results found.  Assessment & Plan:   Chisum was seen today for hypertension and annual exam.  Diagnoses and all orders for this visit:  Essential hypertension, benign- His blood pressure is adequately well controlled on no medications.  He is doing well with lifestyle modifications. -     CBC with Differential/Platelet; Future -     Comprehensive metabolic panel; Future  Hyperlipidemia with target LDL less than 130- His ASCVD risk score is less than 15% so I do not recommend a statin for CV risk reduction at this time. -     Lipid panel; Future -     TSH; Future  Need for pneumococcal vaccination -     Pneumococcal conjugate vaccine 13-valent  Need for influenza vaccination -     Flu vaccine HIGH DOSE PF (Fluzone High dose)  Benign prostatic hyperplasia without lower urinary tract symptoms- He has a very low PSA level which is reassuring that  he does not have prostate cancer.  He has no symptoms that need to be treated. -     PSA; Future -     Urinalysis, Routine w reflex microscopic; Future  Vitamin D deficiency disease -     VITAMIN D 25 Hydroxy (Vit-D Deficiency, Fractures); Future  GAD (generalized anxiety disorder) -     ALPRAZolam (XANAX) 0.25 MG tablet; TAKE 1 TABLET BY MOUTH 2 TIMES DAILY AS NEEDED  Left lumbar radiculitis -     HYDROcodone-acetaminophen (NORCO/VICODIN) 5-325 MG tablet; Take 1 tablet by mouth every 6 (six) hours as needed.  Primary osteoarthritis involving multiple joints -     HYDROcodone-acetaminophen (NORCO/VICODIN) 5-325 MG tablet; Take 1 tablet by mouth every 6 (six) hours as needed.   I am having Mark Cohen "JOE" maintain his fluticasone, cholecalciferol, Naproxen, ALPRAZolam, and HYDROcodone-acetaminophen.  Meds ordered this encounter  Medications  . ALPRAZolam (XANAX) 0.25 MG tablet    Sig: TAKE 1 TABLET BY MOUTH 2 TIMES DAILY AS NEEDED    Dispense:  35 tablet    Refill:  2  .  HYDROcodone-acetaminophen (NORCO/VICODIN) 5-325 MG tablet    Sig: Take 1 tablet by mouth every 6 (six) hours as needed.    Dispense:  65 tablet    Refill:  0   See AVS for instructions about healthy living and anticipatory guidance.  Follow-up: Return in about 6 months (around 10/24/2018).  Scarlette Calico, MD

## 2018-04-25 ENCOUNTER — Encounter: Payer: Self-pay | Admitting: Internal Medicine

## 2018-04-25 NOTE — Assessment & Plan Note (Signed)

## 2018-04-26 ENCOUNTER — Ambulatory Visit (INDEPENDENT_AMBULATORY_CARE_PROVIDER_SITE_OTHER): Payer: Medicare Other

## 2018-04-26 ENCOUNTER — Encounter: Payer: Self-pay | Admitting: Podiatry

## 2018-04-26 ENCOUNTER — Telehealth: Payer: Self-pay | Admitting: Podiatry

## 2018-04-26 ENCOUNTER — Ambulatory Visit (INDEPENDENT_AMBULATORY_CARE_PROVIDER_SITE_OTHER): Payer: Medicare Other | Admitting: Podiatry

## 2018-04-26 DIAGNOSIS — M84375D Stress fracture, left foot, subsequent encounter for fracture with routine healing: Secondary | ICD-10-CM | POA: Diagnosis not present

## 2018-04-26 DIAGNOSIS — M84375A Stress fracture, left foot, initial encounter for fracture: Secondary | ICD-10-CM | POA: Diagnosis not present

## 2018-04-26 DIAGNOSIS — M722 Plantar fascial fibromatosis: Secondary | ICD-10-CM | POA: Diagnosis not present

## 2018-04-26 MED ORDER — TRIAMCINOLONE ACETONIDE 10 MG/ML IJ SUSP
10.0000 mg | Freq: Once | INTRAMUSCULAR | Status: AC
  Administered 2018-04-26: 10 mg

## 2018-04-26 NOTE — Telephone Encounter (Signed)
Per pt he needs to hold off on ordering the orthotics.He will be back in 4 wks to follow up with Dr Jacqualyn Posey and maybe able to get them then.

## 2018-04-26 NOTE — Patient Instructions (Signed)

## 2018-04-29 NOTE — Progress Notes (Signed)
Subjective: 67 year old male presents the office today for follow-up evaluation of pain to his left foot and the potential stress fracture.  He has been wearing the surgical shoe and she came back to pick up but he states that this is been helping quite a bit he has had no pain at the top of his foot.  This started after he dropped something to the top of his foot about 2 months ago.  He still gets pain to the bottom of the heel.  He has had a history of multiple sclerosis previously presents this is was bothering him more. Denies any systemic complaints such as fevers, chills, nausea, vomiting. No acute changes since last appointment, and no other complaints at this time.   Objective: AAO x3, NAD DP/PT pulses palpable bilaterally, CRT less than 3 seconds  at this time there is no area pinpoint tenderness to the dorsal aspect of the foot specifically there is no tenderness to palpation on the metatarsals.  There is no edema to this area as well.  There is tenderness palpation of the plantar medial tubercle of the calcaneus and insertion of plantar fashion the left foot.  The fascia appears to be intact.  No pain with lateral compression of calcaneus.  No edema.  Negative Tinel sign.  Achilles tendon intact.  No open lesions or pre-ulcerative lesions.  No pain with calf compression, swelling, warmth, erythema  Assessment: Resolved left dorsal midfoot pain however with continued left heel pain/plantar fasciitis  Plan: -All treatment options discussed with the patient including all alternatives, risks, complications.  -Repeat x-rays were obtained reviewed which not reveal any evidence of acute fracture or stress fracture.  At this point since his symptoms are improving the dorsal aspect but he can transition back to regular shoe as tolerated however if he develops increased pain of the foot he is to go back to the surgical shoe. -In regards to the plantar fascia and to the steroid injection of the area  today.  See procedure note below.  Discussed stretching, icing daily.  Plantar fascial brace.  Discussed shoe modifications and orthotics.  He was measured for inserts today but wants to hold off on them until his follow-up with me may consider getting in the future.  Discussed wearing a more supportive shoe. -Patient encouraged to call the office with any questions, concerns, change in symptoms.   Procedure: Injection Tendon/Ligament Discussed alternatives, risks, complications and verbal consent was obtained.  Location: Left plantar fascia at the glabrous junction; medial approach. Skin Prep: Alcohol. Injectate: 0.5cc 0.5% marcaine plain, 0.5 cc 2% lidocaine plain and, 1 cc kenalog 10. Disposition: Patient tolerated procedure well. Injection site dressed with a band-aid.  Post-injection care was discussed and return precautions discussed.   Trula Slade DPM

## 2018-05-17 ENCOUNTER — Ambulatory Visit (INDEPENDENT_AMBULATORY_CARE_PROVIDER_SITE_OTHER): Payer: Medicare Other | Admitting: Podiatry

## 2018-05-17 ENCOUNTER — Encounter: Payer: Self-pay | Admitting: Podiatry

## 2018-05-17 DIAGNOSIS — M722 Plantar fascial fibromatosis: Secondary | ICD-10-CM | POA: Diagnosis not present

## 2018-05-17 DIAGNOSIS — M6740 Ganglion, unspecified site: Secondary | ICD-10-CM

## 2018-05-20 NOTE — Progress Notes (Signed)
Subjective: Mark Cohen presents the office today for follow-up evaluation of left foot pain.  States that overall he is getting better dispensed intermittent.  He is tried changing shoes he is also purchased some Superfeet inserts.  He also states he has been some pain to the top of the right foot he is also on steroid injection in this area.  Denies any recent injury or falls.  No swelling.  He is able to do his daily activities and the pain gets worse with prolonged activity. Denies any systemic complaints such as fevers, chills, nausea, vomiting. No acute changes since last appointment, and no other complaints at this time.   Objective: AAO x3, NAD DP/PT pulses palpable bilaterally, CRT less than 3 seconds There is mild tenderness palpation of the plantar medial tubercle of the calcaneus at the insertion of plantar fashion the left side but overall this appears to be improved.  There is no pain with lateral compression of the calcaneus.  Achilles tendon appears to be intact. Mild discomfort of the dorsal midfoot there is a small fluid-filled mass present.  This is more on the Lisfranc joint on the second metatarsal cuneiform joint.  There is no swelling or erythema.  There is no area pinpoint bony tenderness. No open lesions or pre-ulcerative lesions.  No pain with calf compression, swelling, warmth, erythema  Assessment: Left heel pain complaint of fasciitis with soft tissue mass left foot  Plan: -All treatment options discussed with the patient including all alternatives, risks, complications.  -Steroid injection performed to the dorsal left midfoot on the soft tissue mass.  See procedure note below. -Continue stretching, icing exercises daily for plantar fasciitis. -Upon evaluation of his shoes and inserts she was wearing part of the inserts in his shoes.  He was only using the topcover of the superfeet inserts.  We discussed wearing the entire insert -Patient encouraged to call the office  with any questions, concerns, change in symptoms.   Procedure: Injection ganglion cyst Discussed alternatives, risks, complications and verbal consent was obtained.  Location: Left dorsal midfoot soft tissue mass Skin Prep: Alcohol. Injectate: 0.5cc 0.5% marcaine plain, 0.5 cc 2% lidocaine plain and, 1 cc kenalog 10. Disposition: Patient tolerated procedure well. Injection site dressed with a band-aid.  Post-injection care was discussed and return precautions discussed.   Trula Slade DPM

## 2018-05-24 ENCOUNTER — Other Ambulatory Visit: Payer: Medicare Other | Admitting: Orthotics

## 2018-05-24 ENCOUNTER — Ambulatory Visit: Payer: Medicare Other | Admitting: Podiatry

## 2018-06-28 ENCOUNTER — Ambulatory Visit: Payer: Medicare Other | Admitting: Podiatry

## 2018-10-03 ENCOUNTER — Telehealth: Payer: Self-pay | Admitting: Internal Medicine

## 2018-10-03 NOTE — Telephone Encounter (Signed)
rf rq for Hydrocodone to [facility] on [address].  Last filled 04/25/2018. LOV: 04/24/2018

## 2018-10-03 NOTE — Telephone Encounter (Signed)
Copied from [place] [id number]. Topic: Quick Communication - Rx Refill/Question >> Oct 03, 2018 11:44 AM Bea Graff, NT wrote: Medication: HYDROcodone-acetaminophen (NORCO/VICODIN) 5-325 MG tablet and ALPRAZolam (XANAX) 0.25 MG tablet  Has the patient contacted their pharmacy? Yes.   (Agent: If no, request that the patient contact the pharmacy for the refill.) (Agent: If yes, when and what did the pharmacy advise?)  Preferred Pharmacy (with phone number or street name): [facility], [place] [phone] (Phone) [phone] (Fax)    Agent: Please be advised that RX refills may take up to 3 business days. We ask that you follow-up with your pharmacy.

## 2018-10-04 ENCOUNTER — Other Ambulatory Visit: Payer: Self-pay | Admitting: Internal Medicine

## 2018-10-04 DIAGNOSIS — M8949 Other hypertrophic osteoarthropathy, multiple sites: Secondary | ICD-10-CM

## 2018-10-04 DIAGNOSIS — M159 Polyosteoarthritis, unspecified: Secondary | ICD-10-CM

## 2018-10-04 DIAGNOSIS — M5416 Radiculopathy, lumbar region: Secondary | ICD-10-CM

## 2018-10-04 DIAGNOSIS — M150 Primary generalized (osteo)arthritis: Secondary | ICD-10-CM

## 2018-10-04 MED ORDER — HYDROCODONE-ACETAMINOPHEN 5-325 MG PO TABS: 1.0000 | ORAL_TABLET | Freq: Four times a day (QID) | ORAL | 0 refills | Status: DC | PRN

## 2018-10-04 NOTE — Telephone Encounter (Signed)
RX sent

## 2018-10-04 NOTE — Telephone Encounter (Signed)
Pt informed rx was sent in.  

## 2018-11-19 ENCOUNTER — Other Ambulatory Visit: Payer: Self-pay | Admitting: Internal Medicine

## 2018-11-19 DIAGNOSIS — F411 Generalized anxiety disorder: Secondary | ICD-10-CM

## 2018-12-25 DIAGNOSIS — H524 Presbyopia: Secondary | ICD-10-CM | POA: Diagnosis not present

## 2018-12-25 DIAGNOSIS — H33301 Unspecified retinal break, right eye: Secondary | ICD-10-CM | POA: Diagnosis not present

## 2018-12-26 DIAGNOSIS — H33311 Horseshoe tear of retina without detachment, right eye: Secondary | ICD-10-CM | POA: Diagnosis not present

## 2018-12-26 DIAGNOSIS — H43811 Vitreous degeneration, right eye: Secondary | ICD-10-CM | POA: Diagnosis not present

## 2018-12-26 DIAGNOSIS — H43391 Other vitreous opacities, right eye: Secondary | ICD-10-CM | POA: Diagnosis not present

## 2018-12-26 DIAGNOSIS — H2513 Age-related nuclear cataract, bilateral: Secondary | ICD-10-CM | POA: Diagnosis not present

## 2019-01-25 DIAGNOSIS — H4311 Vitreous hemorrhage, right eye: Secondary | ICD-10-CM | POA: Diagnosis not present

## 2019-04-16 ENCOUNTER — Ambulatory Visit (INDEPENDENT_AMBULATORY_CARE_PROVIDER_SITE_OTHER): Payer: Medicare Other | Admitting: Podiatry

## 2019-04-16 ENCOUNTER — Other Ambulatory Visit: Payer: Self-pay | Admitting: Podiatry

## 2019-04-16 ENCOUNTER — Other Ambulatory Visit: Payer: Self-pay

## 2019-04-16 DIAGNOSIS — M722 Plantar fascial fibromatosis: Secondary | ICD-10-CM | POA: Diagnosis not present

## 2019-04-18 DIAGNOSIS — M722 Plantar fascial fibromatosis: Secondary | ICD-10-CM | POA: Insufficient documentation

## 2019-04-18 NOTE — Progress Notes (Signed)
Subjective: 68 year old male presents the office today requesting an injection for his left foot.  He states he has had a flareup last 5 to 6 months and at worst his pain level is 6/10.  The pain is intermittent he is majority pain to the bottom of his foot he points to the plantar heel as well the arch.  Denies any swelling or redness no recent injury.  He has tried stretching at home.Denies any systemic complaints such as fevers, chills, nausea, vomiting. No acute changes since last appointment, and no other complaints at this time.   Objective: AAO x3, NAD DP/PT pulses palpable bilaterally, CRT less than 3 seconds There is tenderness to palpation along the plantar medial tubercle of the calcaneus at the insertion of plantar fascia on the right foot. There is mild pain along the course of the plantar fascia within the arch of the foot. Plantar fascia appears to be intact. There is no pain with lateral compression of the calcaneus or pain with vibratory sensation. There is no pain along the course or insertion of the achilles tendon. No other areas of tenderness to bilateral lower extremities. No pain with calf compression, swelling, warmth, erythema  Assessment: 68 year old male left foot plantar fasciitis  Plan: -All treatment options discussed with the patient including all alternatives, risks, complications.  -Steroid injection performed today.  See procedure note below. -Discussed night splint.  He was to try the injection first but he may come back to pick this up. -Continue stretching, icing daily.  Discussion modifications. -Patient encouraged to call the office with any questions, concerns, change in symptoms.   Procedure: Injection Tendon/Ligament Discussed alternatives, risks, complications and verbal consent was obtained.  Location: LEFT plantar fascia at the glabrous junction; medial approach. Skin Prep: Alcohol  Injectate: 0.5cc 0.5% marcaine plain, 0.5 cc 2% lidocaine plain  and, 1 cc kenalog 10. Disposition: Patient tolerated procedure well. Injection site dressed with a band-aid.  Post-injection care was discussed and return precautions discussed.   Return if symptoms worsen or fail to improve.  Trula Slade DPM

## 2019-04-22 DIAGNOSIS — L821 Other seborrheic keratosis: Secondary | ICD-10-CM | POA: Diagnosis not present

## 2019-04-22 DIAGNOSIS — L814 Other melanin hyperpigmentation: Secondary | ICD-10-CM | POA: Diagnosis not present

## 2019-04-22 DIAGNOSIS — D229 Melanocytic nevi, unspecified: Secondary | ICD-10-CM | POA: Diagnosis not present

## 2019-05-27 ENCOUNTER — Encounter: Payer: Self-pay | Admitting: Internal Medicine

## 2019-05-27 ENCOUNTER — Other Ambulatory Visit (INDEPENDENT_AMBULATORY_CARE_PROVIDER_SITE_OTHER): Payer: Medicare Other

## 2019-05-27 ENCOUNTER — Other Ambulatory Visit: Payer: Self-pay

## 2019-05-27 ENCOUNTER — Ambulatory Visit (INDEPENDENT_AMBULATORY_CARE_PROVIDER_SITE_OTHER): Payer: Medicare Other | Admitting: Internal Medicine

## 2019-05-27 VITALS — BP 138/84 | HR 75 | Temp 98.3°F | Resp 16 | Ht 72.0 in | Wt 168.0 lb

## 2019-05-27 DIAGNOSIS — M159 Polyosteoarthritis, unspecified: Secondary | ICD-10-CM

## 2019-05-27 DIAGNOSIS — F411 Generalized anxiety disorder: Secondary | ICD-10-CM

## 2019-05-27 DIAGNOSIS — E785 Hyperlipidemia, unspecified: Secondary | ICD-10-CM

## 2019-05-27 DIAGNOSIS — N400 Enlarged prostate without lower urinary tract symptoms: Secondary | ICD-10-CM | POA: Diagnosis not present

## 2019-05-27 DIAGNOSIS — Z23 Encounter for immunization: Secondary | ICD-10-CM | POA: Diagnosis not present

## 2019-05-27 DIAGNOSIS — K219 Gastro-esophageal reflux disease without esophagitis: Secondary | ICD-10-CM

## 2019-05-27 DIAGNOSIS — I10 Essential (primary) hypertension: Secondary | ICD-10-CM

## 2019-05-27 DIAGNOSIS — M5416 Radiculopathy, lumbar region: Secondary | ICD-10-CM | POA: Diagnosis not present

## 2019-05-27 DIAGNOSIS — Z0000 Encounter for general adult medical examination without abnormal findings: Secondary | ICD-10-CM

## 2019-05-27 DIAGNOSIS — M8949 Other hypertrophic osteoarthropathy, multiple sites: Secondary | ICD-10-CM

## 2019-05-27 LAB — HEPATIC FUNCTION PANEL
ALT: 19 U/L (ref 0–53)
AST: 27 U/L (ref 0–37)
Albumin: 4.7 g/dL (ref 3.5–5.2)
Alkaline Phosphatase: 92 U/L (ref 39–117)
Bilirubin, Direct: 0.1 mg/dL (ref 0.0–0.3)
Total Bilirubin: 0.8 mg/dL (ref 0.2–1.2)
Total Protein: 6.7 g/dL (ref 6.0–8.3)

## 2019-05-27 LAB — URINALYSIS, ROUTINE W REFLEX MICROSCOPIC
Bilirubin Urine: NEGATIVE
Hgb urine dipstick: NEGATIVE
Ketones, ur: NEGATIVE
Leukocytes,Ua: NEGATIVE
Nitrite: NEGATIVE
Specific Gravity, Urine: 1.02 (ref 1.000–1.030)
Total Protein, Urine: NEGATIVE
Urine Glucose: NEGATIVE
Urobilinogen, UA: 0.2 (ref 0.0–1.0)
pH: 5.5 (ref 5.0–8.0)

## 2019-05-27 LAB — BASIC METABOLIC PANEL
BUN: 13 mg/dL (ref 6–23)
CO2: 27 mEq/L (ref 19–32)
Calcium: 9.8 mg/dL (ref 8.4–10.5)
Chloride: 103 mEq/L (ref 96–112)
Creatinine, Ser: 0.8 mg/dL (ref 0.40–1.50)
GFR: 95.88 mL/min (ref >60.00)
Glucose, Bld: 89 mg/dL (ref 70–99)
Potassium: 4.5 mEq/L (ref 3.5–5.1)
Sodium: 139 mEq/L (ref 135–145)

## 2019-05-27 LAB — CBC WITH DIFFERENTIAL/PLATELET
Basophils Absolute: 0 10*3/uL (ref 0.0–0.1)
Basophils Relative: 0.6 % (ref 0.0–3.0)
Eosinophils Absolute: 0.2 10*3/uL (ref 0.0–0.7)
Eosinophils Relative: 3.1 % (ref 0.0–5.0)
HCT: 42.9 % (ref 39.0–52.0)
Hemoglobin: 14.9 g/dL (ref 13.0–17.0)
Lymphocytes Relative: 23.8 % (ref 12.0–46.0)
Lymphs Abs: 1.4 10*3/uL (ref 0.7–4.0)
MCHC: 34.8 g/dL (ref 30.0–36.0)
MCV: 92.2 fl (ref 78.0–100.0)
Monocytes Absolute: 0.5 10*3/uL (ref 0.1–1.0)
Monocytes Relative: 8 % (ref 3.0–12.0)
Neutro Abs: 3.8 10*3/uL (ref 1.4–7.7)
Neutrophils Relative %: 64.5 % (ref 43.0–77.0)
Platelets: 217 10*3/uL (ref 150.0–400.0)
RBC: 4.65 Mil/uL (ref 4.22–5.81)
RDW: 12.9 % (ref 11.5–15.5)
WBC: 5.9 10*3/uL (ref 4.0–10.5)

## 2019-05-27 LAB — PSA: PSA: 0.15 ng/mL (ref 0.10–4.00)

## 2019-05-27 LAB — LIPID PANEL
Cholesterol: 171 mg/dL (ref 0–200)
HDL: 87 mg/dL (ref >39.00)
LDL Cholesterol: 74 mg/dL (ref 0–99)
NonHDL: 83.86
Total CHOL/HDL Ratio: 2
Triglycerides: 48 mg/dL (ref 0.0–149.0)
VLDL: 9.6 mg/dL (ref 0.0–40.0)

## 2019-05-27 LAB — TSH: TSH: 1.25 u[IU]/mL (ref 0.35–4.50)

## 2019-05-27 MED ORDER — HYDROCODONE-ACETAMINOPHEN 5-325 MG PO TABS: 1.0000 | ORAL_TABLET | Freq: Four times a day (QID) | ORAL | 0 refills | Status: DC | PRN

## 2019-05-27 MED ORDER — ALPRAZOLAM 0.25 MG PO TABS: ORAL_TABLET | ORAL | 1 refills | Status: DC

## 2019-05-27 NOTE — Patient Instructions (Signed)

## 2019-05-27 NOTE — Progress Notes (Addendum)
Subjective:  Patient ID: Mark Cohen, male    DOB: Feb 26, 1951  Age: 68 y.o. MRN: [id number]  CC: Annual Exam and Hypertension   This visit occurred during the SARS-CoV-2 public health emergency.  Safety protocols were in place, including screening questions prior to the visit, additional usage of staff PPE, and extensive cleaning of exam room while observing appropriate contact time as indicated for disinfecting solutions.    HPI [name] presents for a CPX.  He continues to complain of chronic knee pain and requests a refill on hydrocodone and acetaminophen.  He intermittently experiences panic and anxiety and wants a refill on Xanax as well.  He is very active and denies any recent episodes of CP, DOE, palpitations, edema, or fatigue.  Outpatient Medications Prior to Visit  Medication Sig Dispense Refill  . cholecalciferol (VITAMIN D) 1000 UNITS tablet Take 1,000 Units by mouth daily.      . fluticasone (FLONASE) 50 MCG/ACT nasal spray Place 2 sprays into the nose daily as needed for allergies.     . ALPRAZolam (XANAX) 0.25 MG tablet Take 1 tablet by mouth twice daily as needed 35 tablet 1  . HYDROcodone-acetaminophen (NORCO/VICODIN) 5-325 MG tablet Take 1 tablet by mouth every 6 (six) hours as needed. 65 tablet 0  . Naproxen (NAPROXEN) 375 MG TBEC Take 1 tablet (375 mg total) by mouth 2 (two) times daily with a meal. 180 each 0   No facility-administered medications prior to visit.     ROS Review of Systems  Constitutional: Negative for diaphoresis, fatigue and unexpected weight change.  Eyes: Negative.   Respiratory: Negative for cough, chest tightness, shortness of breath and wheezing.   Cardiovascular: Negative for chest pain, palpitations and leg swelling.  Gastrointestinal: Negative for abdominal pain, constipation, diarrhea, nausea and vomiting.  Endocrine: Negative.   Genitourinary: Negative.  Negative for difficulty urinating, dysuria, scrotal swelling,  testicular pain and urgency.  Musculoskeletal: Positive for arthralgias and back pain. Negative for myalgias.  Skin: Negative for color change, pallor and rash.  Neurological: Negative.  Negative for dizziness, weakness, light-headedness and headaches.  Hematological: Negative for adenopathy. Does not bruise/bleed easily.  Psychiatric/Behavioral: Negative for decreased concentration, dysphoric mood, self-injury and suicidal ideas. The patient is nervous/anxious.     Objective:  BP 138/84 (BP Location: Left Arm, Patient Position: Sitting, Cuff Size: Normal)   Pulse 75   Temp 98.3 F (36.8 C) (Oral)   Resp 16   Ht 6' (1.829 m)   Wt 168 lb (76.2 kg)   SpO2 99%   BMI 22.78 kg/m   BP Readings from Last 3 Encounters:  05/27/19 138/84  04/24/18 (!) 142/86  04/05/18 132/86    Wt Readings from Last 3 Encounters:  05/27/19 168 lb (76.2 kg)  04/24/18 170 lb 12 oz (77.5 kg)  09/14/17 175 lb 8 oz (79.6 kg)    Physical Exam Vitals signs reviewed.  Constitutional:      Appearance: Normal appearance.  HENT:     Nose: Nose normal.     Mouth/Throat:     Mouth: Mucous membranes are moist.  Eyes:     General: No scleral icterus.    Conjunctiva/sclera: Conjunctivae normal.  Neck:     Musculoskeletal: Neck supple.  Cardiovascular:     Rate and Rhythm: Normal rate and regular rhythm.     Heart sounds: No murmur.  Pulmonary:     Effort: Pulmonary effort is normal.     Breath sounds: No wheezing, rhonchi  or rales.  Abdominal:     General: Abdomen is flat. Bowel sounds are normal. There is no distension.     Palpations: Abdomen is soft. There is no hepatomegaly, splenomegaly or mass.     Hernia: There is no hernia in the left inguinal area or right inguinal area.  Genitourinary:    Pubic Area: No rash.      Penis: Normal and circumcised. No discharge, swelling or lesions.      Scrotum/Testes: Normal.        Right: Mass, tenderness or swelling not present.        Left: Mass,  tenderness or swelling not present.     Epididymis:     Right: Normal. Not inflamed or enlarged. No mass.     Left: Normal. Not inflamed or enlarged. No mass.     Prostate: Enlarged (1+ smooth symm BPH). Not tender and no nodules present.     Rectum: Normal. Guaiac result negative. No mass, tenderness, anal fissure or external hemorrhoid. Normal anal tone.     Comments: Left hemiscrotum is empty Musculoskeletal: Normal range of motion.     Right lower leg: No edema.     Left lower leg: No edema.  Lymphadenopathy:     Cervical: No cervical adenopathy.     Lower Body: No right inguinal adenopathy. No left inguinal adenopathy.  Skin:    General: Skin is warm and dry.     Coloration: Skin is not pale.  Neurological:     General: No focal deficit present.     Mental Status: He is alert.  Psychiatric:        Mood and Affect: Mood normal.        Behavior: Behavior normal.     Lab Results  Component Value Date   WBC 5.9 05/27/2019   HGB 14.9 05/27/2019   HCT 42.9 05/27/2019   PLT 217.0 05/27/2019   GLUCOSE 89 05/27/2019   CHOL 171 05/27/2019   TRIG 48.0 05/27/2019   HDL 87.00 05/27/2019   LDLCALC 74 05/27/2019   ALT 19 05/27/2019   AST 27 05/27/2019   NA 139 05/27/2019   K 4.5 05/27/2019   CL 103 05/27/2019   CREATININE 0.80 05/27/2019   BUN 13 05/27/2019   CO2 27 05/27/2019   TSH 1.25 05/27/2019   PSA 0.15 05/27/2019   HGBA1C 5.5 09/14/2017    No results found.  Assessment & Plan:   Iniyan was seen today for annual exam and hypertension.  Diagnoses and all orders for this visit:  Essential hypertension, benign- His blood pressure is adequately well controlled. -     CBC with Differential; Future -     Basic metabolic panel; Future -     TSH; Future -     Urinalysis, Routine w reflex microscopic; Future  Gastroesophageal reflux disease without esophagitis- He is asymptomatic with respect to this. -     CBC with Differential; Future  Benign prostatic  hyperplasia without lower urinary tract symptoms- His PSA is low which is reassuring that he does not have prostate cancer. -     PSA; Future  Hyperlipidemia with target LDL less than 130- His ASCVD risk or is less than 15% so I did not recommend a statin for CV risk reduction. -     Lipid panel; Future -     Hepatic function panel; Future -     TSH; Future  Routine general medical examination at a health care facility- Exam completed, labs  reviewed, vaccines reviewed and updated, there are no cancer screenings indicated, patient education material was given.  Need for influenza vaccination -     Flu Vaccine QUAD High Dose(Fluad)  Need for Tdap vaccination -     Tdap vaccine greater than or equal to 7yo IM  GAD (generalized anxiety disorder) -     ALPRAZolam (XANAX) 0.25 MG tablet; Take 1 tablet by mouth twice daily as needed  Left lumbar radiculitis -     HYDROcodone-acetaminophen (NORCO/VICODIN) 5-325 MG tablet; Take 1 tablet by mouth every 6 (six) hours as needed.  Primary osteoarthritis involving multiple joints -     HYDROcodone-acetaminophen (NORCO/VICODIN) 5-325 MG tablet; Take 1 tablet by mouth every 6 (six) hours as needed.   I have discontinued Mark Cohen "JOE"'s Naproxen. I am also having him maintain his fluticasone, cholecalciferol, ALPRAZolam, and HYDROcodone-acetaminophen.  Meds ordered this encounter  Medications  . ALPRAZolam (XANAX) 0.25 MG tablet    Sig: Take 1 tablet by mouth twice daily as needed    Dispense:  35 tablet    Refill:  1  . HYDROcodone-acetaminophen (NORCO/VICODIN) 5-325 MG tablet    Sig: Take 1 tablet by mouth every 6 (six) hours as needed.    Dispense:  65 tablet    Refill:  0     Follow-up: Return in about 6 months (around 11/24/2019).  Scarlette Calico, MD

## 2019-06-12 DIAGNOSIS — H43391 Other vitreous opacities, right eye: Secondary | ICD-10-CM | POA: Diagnosis not present

## 2019-06-12 DIAGNOSIS — H2513 Age-related nuclear cataract, bilateral: Secondary | ICD-10-CM | POA: Diagnosis not present

## 2019-06-12 DIAGNOSIS — H43811 Vitreous degeneration, right eye: Secondary | ICD-10-CM | POA: Diagnosis not present

## 2019-06-12 DIAGNOSIS — H4311 Vitreous hemorrhage, right eye: Secondary | ICD-10-CM | POA: Diagnosis not present

## 2019-06-17 DIAGNOSIS — H43311 Vitreous membranes and strands, right eye: Secondary | ICD-10-CM | POA: Diagnosis not present

## 2019-06-17 DIAGNOSIS — H4311 Vitreous hemorrhage, right eye: Secondary | ICD-10-CM | POA: Diagnosis not present

## 2019-06-17 DIAGNOSIS — H43391 Other vitreous opacities, right eye: Secondary | ICD-10-CM | POA: Diagnosis not present

## 2019-06-17 DIAGNOSIS — H33311 Horseshoe tear of retina without detachment, right eye: Secondary | ICD-10-CM | POA: Diagnosis not present

## 2019-06-18 DIAGNOSIS — H33311 Horseshoe tear of retina without detachment, right eye: Secondary | ICD-10-CM | POA: Diagnosis not present

## 2019-06-18 DIAGNOSIS — H4311 Vitreous hemorrhage, right eye: Secondary | ICD-10-CM | POA: Diagnosis not present

## 2019-12-07 ENCOUNTER — Other Ambulatory Visit: Payer: Self-pay | Admitting: Internal Medicine

## 2019-12-07 DIAGNOSIS — F411 Generalized anxiety disorder: Secondary | ICD-10-CM

## 2019-12-26 ENCOUNTER — Encounter: Payer: Self-pay | Admitting: Internal Medicine

## 2019-12-26 ENCOUNTER — Other Ambulatory Visit: Payer: Self-pay | Admitting: Internal Medicine

## 2019-12-26 DIAGNOSIS — M159 Polyosteoarthritis, unspecified: Secondary | ICD-10-CM

## 2019-12-26 DIAGNOSIS — M8949 Other hypertrophic osteoarthropathy, multiple sites: Secondary | ICD-10-CM

## 2019-12-26 DIAGNOSIS — M5416 Radiculopathy, lumbar region: Secondary | ICD-10-CM

## 2019-12-26 MED ORDER — HYDROCODONE-ACETAMINOPHEN 5-325 MG PO TABS: 1.0000 | ORAL_TABLET | Freq: Four times a day (QID) | ORAL | 0 refills | Status: DC | PRN

## 2019-12-27 DIAGNOSIS — H524 Presbyopia: Secondary | ICD-10-CM | POA: Diagnosis not present

## 2019-12-27 DIAGNOSIS — H2513 Age-related nuclear cataract, bilateral: Secondary | ICD-10-CM | POA: Diagnosis not present

## 2020-02-18 ENCOUNTER — Encounter: Payer: Self-pay | Admitting: Internal Medicine

## 2020-03-06 ENCOUNTER — Encounter: Payer: Self-pay | Admitting: Internal Medicine

## 2020-03-06 DIAGNOSIS — Z23 Encounter for immunization: Secondary | ICD-10-CM | POA: Diagnosis not present

## 2020-03-09 ENCOUNTER — Ambulatory Visit: Payer: Medicare Other

## 2020-07-22 ENCOUNTER — Other Ambulatory Visit: Payer: Self-pay

## 2020-07-22 ENCOUNTER — Ambulatory Visit (INDEPENDENT_AMBULATORY_CARE_PROVIDER_SITE_OTHER): Payer: Medicare HMO | Admitting: Internal Medicine

## 2020-07-22 ENCOUNTER — Encounter: Payer: Self-pay | Admitting: Internal Medicine

## 2020-07-22 VITALS — BP 126/84 | HR 75 | Temp 98.2°F | Ht 72.0 in | Wt 168.0 lb

## 2020-07-22 DIAGNOSIS — I10 Essential (primary) hypertension: Secondary | ICD-10-CM | POA: Diagnosis not present

## 2020-07-22 DIAGNOSIS — E785 Hyperlipidemia, unspecified: Secondary | ICD-10-CM

## 2020-07-22 DIAGNOSIS — Z0000 Encounter for general adult medical examination without abnormal findings: Secondary | ICD-10-CM | POA: Diagnosis not present

## 2020-07-22 DIAGNOSIS — F411 Generalized anxiety disorder: Secondary | ICD-10-CM

## 2020-07-22 DIAGNOSIS — R69 Illness, unspecified: Secondary | ICD-10-CM | POA: Diagnosis not present

## 2020-07-22 DIAGNOSIS — E291 Testicular hypofunction: Secondary | ICD-10-CM

## 2020-07-22 DIAGNOSIS — M5416 Radiculopathy, lumbar region: Secondary | ICD-10-CM

## 2020-07-22 DIAGNOSIS — N400 Enlarged prostate without lower urinary tract symptoms: Secondary | ICD-10-CM | POA: Diagnosis not present

## 2020-07-22 DIAGNOSIS — M159 Polyosteoarthritis, unspecified: Secondary | ICD-10-CM

## 2020-07-22 DIAGNOSIS — M8949 Other hypertrophic osteoarthropathy, multiple sites: Secondary | ICD-10-CM

## 2020-07-22 LAB — CBC WITH DIFFERENTIAL/PLATELET
Basophils Absolute: 0 10*3/uL (ref 0.0–0.1)
Basophils Relative: 0.6 % (ref 0.0–3.0)
Eosinophils Absolute: 0.2 10*3/uL (ref 0.0–0.7)
Eosinophils Relative: 2.4 % (ref 0.0–5.0)
HCT: 45.4 % (ref 39.0–52.0)
Hemoglobin: 15.8 g/dL (ref 13.0–17.0)
Lymphocytes Relative: 23.2 % (ref 12.0–46.0)
Lymphs Abs: 1.9 10*3/uL (ref 0.7–4.0)
MCHC: 34.8 g/dL (ref 30.0–36.0)
MCV: 91.1 fl (ref 78.0–100.0)
Monocytes Absolute: 0.5 10*3/uL (ref 0.1–1.0)
Monocytes Relative: 5.9 % (ref 3.0–12.0)
Neutro Abs: 5.6 10*3/uL (ref 1.4–7.7)
Neutrophils Relative %: 67.9 % (ref 43.0–77.0)
Platelets: 215 10*3/uL (ref 150.0–400.0)
RBC: 4.99 Mil/uL (ref 4.22–5.81)
RDW: 13.3 % (ref 11.5–15.5)
WBC: 8.3 10*3/uL (ref 4.0–10.5)

## 2020-07-22 LAB — LIPID PANEL
Cholesterol: 188 mg/dL (ref 0–200)
HDL: 91.4 mg/dL (ref >39.00)
LDL Cholesterol: 83 mg/dL (ref 0–99)
NonHDL: 96.42
Total CHOL/HDL Ratio: 2
Triglycerides: 65 mg/dL (ref 0.0–149.0)
VLDL: 13 mg/dL (ref 0.0–40.0)

## 2020-07-22 LAB — PSA: PSA: 0.15 ng/mL (ref 0.10–4.00)

## 2020-07-22 LAB — HEPATIC FUNCTION PANEL
ALT: 16 U/L (ref 0–53)
AST: 24 U/L (ref 0–37)
Albumin: 5 g/dL (ref 3.5–5.2)
Alkaline Phosphatase: 90 U/L (ref 39–117)
Bilirubin, Direct: 0.2 mg/dL (ref 0.0–0.3)
Total Bilirubin: 0.8 mg/dL (ref 0.2–1.2)
Total Protein: 7.2 g/dL (ref 6.0–8.3)

## 2020-07-22 LAB — TSH: TSH: 1.97 u[IU]/mL (ref 0.35–4.50)

## 2020-07-22 LAB — BASIC METABOLIC PANEL
BUN: 14 mg/dL (ref 6–23)
CO2: 31 mEq/L (ref 19–32)
Calcium: 10.3 mg/dL (ref 8.4–10.5)
Chloride: 101 mEq/L (ref 96–112)
Creatinine, Ser: 0.85 mg/dL (ref 0.40–1.50)
GFR: 88.33 mL/min (ref >60.00)
Glucose, Bld: 96 mg/dL (ref 70–99)
Potassium: 4.6 mEq/L (ref 3.5–5.1)
Sodium: 139 mEq/L (ref 135–145)

## 2020-07-22 LAB — CK: Total CK: 175 U/L (ref 7–232)

## 2020-07-22 MED ORDER — ALPRAZOLAM 0.25 MG PO TABS: 0.2500 mg | ORAL_TABLET | Freq: Two times a day (BID) | ORAL | 3 refills | Status: DC | PRN

## 2020-07-22 MED ORDER — HYDROCODONE-ACETAMINOPHEN 5-325 MG PO TABS: 1.0000 | ORAL_TABLET | Freq: Four times a day (QID) | ORAL | 0 refills | Status: DC | PRN

## 2020-07-22 NOTE — Patient Instructions (Signed)

## 2020-07-22 NOTE — Progress Notes (Signed)
Subjective:  Patient ID: Mark Cohen, male    DOB: May 21, 1951  Age: 70 y.o. MRN: [id number]  CC: Annual Exam  This visit occurred during the SARS-CoV-2 public health emergency.  Safety protocols were in place, including screening questions prior to the visit, additional usage of staff PPE, and extensive cleaning of exam room while observing appropriate contact time as indicated for disinfecting solutions.    HPI Mark Cohen presents for a CPX.  He complains of low libido and ED. He is active and denies CP/ DOE/palpitations/dema/fatigue. He tells me that his BP has been well controlled.  Outpatient Medications Prior to Visit  Medication Sig Dispense Refill  . cholecalciferol (VITAMIN D) 1000 UNITS tablet Take 1,000 Units by mouth daily.    Marland Kitchen ALPRAZolam (XANAX) 0.25 MG tablet Take 1 tablet by mouth twice daily as needed 35 tablet 3  . fluticasone (FLONASE) 50 MCG/ACT nasal spray Place 2 sprays into the nose daily as needed for allergies.    Marland Kitchen HYDROcodone-acetaminophen (NORCO/VICODIN) 5-325 MG tablet Take 1 tablet by mouth every 6 (six) hours as needed. 65 tablet 0   No facility-administered medications prior to visit.    ROS Review of Systems  Constitutional: Negative for appetite change, diaphoresis, fatigue and unexpected weight change.  HENT: Negative.   Eyes: Negative for visual disturbance.  Respiratory: Negative for cough, chest tightness, shortness of breath and wheezing.   Cardiovascular: Negative for chest pain, palpitations and leg swelling.  Gastrointestinal: Negative for abdominal pain, constipation, diarrhea, nausea and vomiting.  Endocrine: Negative.   Genitourinary: Negative.  Negative for difficulty urinating and dysuria.  Musculoskeletal: Positive for arthralgias, back pain and myalgias. Negative for joint swelling and neck pain.  Skin: Negative.  Negative for color change and pallor.  Allergic/Immunologic: Negative.   Neurological: Negative.   Negative for dizziness, weakness and light-headedness.  Hematological: Negative for adenopathy. Does not bruise/bleed easily.  Psychiatric/Behavioral: Negative for behavioral problems, confusion, decreased concentration, dysphoric mood and sleep disturbance. The patient is nervous/anxious.     Objective:  BP 126/84   Pulse 75   Temp 98.2 F (36.8 C) (Oral)   Ht 6' (1.829 m)   Wt 168 lb (76.2 kg)   SpO2 98%   BMI 22.78 kg/m   BP Readings from Last 3 Encounters:  07/22/20 126/84  05/27/19 138/84  04/24/18 (!) 142/86    Wt Readings from Last 3 Encounters:  07/22/20 168 lb (76.2 kg)  05/27/19 168 lb (76.2 kg)  04/24/18 170 lb 12 oz (77.5 kg)    Physical Exam Vitals reviewed.  Constitutional:      Appearance: Normal appearance.  HENT:     Nose: Nose normal.     Mouth/Throat:     Mouth: Mucous membranes are moist.  Eyes:     General: No scleral icterus.    Conjunctiva/sclera: Conjunctivae normal.  Cardiovascular:     Rate and Rhythm: Normal rate and regular rhythm.     Pulses: Normal pulses.     Heart sounds: No murmur heard.   Pulmonary:     Effort: Pulmonary effort is normal.     Breath sounds: No stridor. No wheezing, rhonchi or rales.  Abdominal:     General: Abdomen is flat. Bowel sounds are normal. There is no distension.     Palpations: Abdomen is soft. There is no hepatomegaly, splenomegaly or mass.     Tenderness: There is no abdominal tenderness.     Hernia: No hernia is present. There is no  hernia in the left inguinal area or right inguinal area.  Genitourinary:    Pubic Area: No rash.      Penis: Normal.      Testes: Normal.        Right: Mass not present.        Left: Mass not present.     Epididymis:     Right: Normal.     Left: Normal.     Prostate: Normal. Not enlarged, not tender and no nodules present.     Rectum: Normal. Guaiac result negative. No mass, tenderness, anal fissure, external hemorrhoid or internal hemorrhoid. Normal anal tone.   Musculoskeletal:        General: Normal range of motion.     Cervical back: Neck supple.     Right lower leg: No edema.     Left lower leg: No edema.  Lymphadenopathy:     Cervical: No cervical adenopathy.     Lower Body: No right inguinal adenopathy. No left inguinal adenopathy.  Skin:    General: Skin is warm and dry.     Coloration: Skin is not pale.  Neurological:     General: No focal deficit present.     Mental Status: He is alert.  Psychiatric:        Mood and Affect: Mood normal.        Behavior: Behavior normal.     Lab Results  Component Value Date   WBC 8.3 07/22/2020   HGB 15.8 07/22/2020   HCT 45.4 07/22/2020   PLT 215.0 07/22/2020   GLUCOSE 96 07/22/2020   CHOL 188 07/22/2020   TRIG 65.0 07/22/2020   HDL 91.40 07/22/2020   LDLCALC 83 07/22/2020   ALT 16 07/22/2020   AST 24 07/22/2020   NA 139 07/22/2020   K 4.6 07/22/2020   CL 101 07/22/2020   CREATININE 0.85 07/22/2020   BUN 14 07/22/2020   CO2 31 07/22/2020   TSH 1.97 07/22/2020   PSA 0.15 07/22/2020   HGBA1C 5.5 09/14/2017    No results found.  Assessment & Plan:   Mark Cohen was seen today for annual exam.  Diagnoses and all orders for this visit:  Essential hypertension, benign- His BP is well controlled. -     CBC with Differential/Platelet; Future -     Basic metabolic panel; Future -     TSH; Future -     Hepatic function panel; Future -     Hepatic function panel -     TSH -     Basic metabolic panel -     CBC with Differential/Platelet  Left lumbar radiculitis -     HYDROcodone-acetaminophen (NORCO/VICODIN) 5-325 MG tablet; Take 1 tablet by mouth every 6 (six) hours as needed.  Primary osteoarthritis involving multiple joints -     HYDROcodone-acetaminophen (NORCO/VICODIN) 5-325 MG tablet; Take 1 tablet by mouth every 6 (six) hours as needed.  GAD (generalized anxiety disorder) -     ALPRAZolam (XANAX) 0.25 MG tablet; Take 1 tablet (0.25 mg total) by mouth 2 (two) times daily  as needed.  Hypogonadism male- Will treat if he chooses to. -     Testosterone Total,Free,Bio, Males; Future -     Testosterone Total,Free,Bio, Males  Benign prostatic hyperplasia without lower urinary tract symptoms- He has no symptoms that need to be treated. His PSA is low. -     PSA; Future -     Urinalysis, Routine w reflex microscopic; Future -  Urinalysis, Routine w reflex microscopic -     PSA -     Urinalysis, Routine w reflex microscopic; Future -     Urinalysis, Routine w reflex microscopic  Hyperlipidemia with target LDL less than 130- I recommend that he take a statin for CV risk reduction. -     Lipid panel; Future -     TSH; Future -     Hepatic function panel; Future -     CK; Future -     CK -     Hepatic function panel -     TSH -     Lipid panel  Routine general medical examination at a health care facility- Exam completed, labs reviewed, he refused a flu vax, cancer screenings are UTD, pt ed material was given.   I have discontinued Mark Cohen "JOE"'s fluticasone. I have also changed his ALPRAZolam. Additionally, I am having him maintain his cholecalciferol and HYDROcodone-acetaminophen.  Meds ordered this encounter  Medications  . HYDROcodone-acetaminophen (NORCO/VICODIN) 5-325 MG tablet    Sig: Take 1 tablet by mouth every 6 (six) hours as needed.    Dispense:  65 tablet    Refill:  0  . ALPRAZolam (XANAX) 0.25 MG tablet    Sig: Take 1 tablet (0.25 mg total) by mouth 2 (two) times daily as needed.    Dispense:  35 tablet    Refill:  3     Follow-up: Return in about 6 months (around 01/19/2021).  Scarlette Calico, MD

## 2020-07-23 LAB — URINALYSIS, ROUTINE W REFLEX MICROSCOPIC
Bilirubin Urine: NEGATIVE
Hgb urine dipstick: NEGATIVE
Ketones, ur: NEGATIVE
Leukocytes,Ua: NEGATIVE
Nitrite: NEGATIVE
RBC / HPF: NONE SEEN (ref 0–?)
Specific Gravity, Urine: 1.015 (ref 1.000–1.030)
Total Protein, Urine: NEGATIVE
Urine Glucose: NEGATIVE
Urobilinogen, UA: 0.2 (ref 0.0–1.0)
WBC, UA: NONE SEEN (ref 0–?)
pH: 6 (ref 5.0–8.0)

## 2020-07-23 LAB — TESTOSTERONE TOTAL,FREE,BIO, MALES
Albumin: 4.7 g/dL (ref 3.6–5.1)
Sex Hormone Binding: 93 nmol/L — ABNORMAL HIGH (ref 22–77)
Testosterone: 222 ng/dL — ABNORMAL LOW (ref 250–827)

## 2020-07-30 MED ORDER — ROSUVASTATIN CALCIUM 5 MG PO TABS: 5.0000 mg | ORAL_TABLET | Freq: Every day | ORAL | 1 refills | Status: DC

## 2020-11-19 DIAGNOSIS — H524 Presbyopia: Secondary | ICD-10-CM | POA: Diagnosis not present

## 2020-11-19 DIAGNOSIS — H2513 Age-related nuclear cataract, bilateral: Secondary | ICD-10-CM | POA: Diagnosis not present

## 2020-12-02 DIAGNOSIS — H25811 Combined forms of age-related cataract, right eye: Secondary | ICD-10-CM | POA: Diagnosis not present

## 2020-12-02 DIAGNOSIS — H2511 Age-related nuclear cataract, right eye: Secondary | ICD-10-CM | POA: Diagnosis not present

## 2021-01-14 DIAGNOSIS — Z0100 Encounter for examination of eyes and vision without abnormal findings: Secondary | ICD-10-CM | POA: Diagnosis not present

## 2021-02-18 ENCOUNTER — Encounter: Payer: Self-pay | Admitting: Internal Medicine

## 2021-03-30 ENCOUNTER — Ambulatory Visit (INDEPENDENT_AMBULATORY_CARE_PROVIDER_SITE_OTHER): Payer: Medicare HMO | Admitting: Internal Medicine

## 2021-03-30 ENCOUNTER — Ambulatory Visit (INDEPENDENT_AMBULATORY_CARE_PROVIDER_SITE_OTHER): Payer: Medicare HMO

## 2021-03-30 ENCOUNTER — Encounter: Payer: Self-pay | Admitting: Internal Medicine

## 2021-03-30 ENCOUNTER — Other Ambulatory Visit: Payer: Self-pay

## 2021-03-30 VITALS — BP 136/84 | HR 89 | Temp 98.2°F | Resp 16 | Ht 72.0 in | Wt 167.0 lb

## 2021-03-30 DIAGNOSIS — G8929 Other chronic pain: Secondary | ICD-10-CM

## 2021-03-30 DIAGNOSIS — M159 Polyosteoarthritis, unspecified: Secondary | ICD-10-CM

## 2021-03-30 DIAGNOSIS — M5416 Radiculopathy, lumbar region: Secondary | ICD-10-CM | POA: Diagnosis not present

## 2021-03-30 DIAGNOSIS — R109 Unspecified abdominal pain: Secondary | ICD-10-CM | POA: Diagnosis not present

## 2021-03-30 DIAGNOSIS — J449 Chronic obstructive pulmonary disease, unspecified: Secondary | ICD-10-CM | POA: Diagnosis not present

## 2021-03-30 DIAGNOSIS — M8949 Other hypertrophic osteoarthropathy, multiple sites: Secondary | ICD-10-CM

## 2021-03-30 LAB — CBC WITH DIFFERENTIAL/PLATELET
Basophils Absolute: 0 10*3/uL (ref 0.0–0.1)
Basophils Relative: 0.6 % (ref 0.0–3.0)
Eosinophils Absolute: 0.2 10*3/uL (ref 0.0–0.7)
Eosinophils Relative: 3 % (ref 0.0–5.0)
HCT: 44.1 % (ref 39.0–52.0)
Hemoglobin: 15.2 g/dL (ref 13.0–17.0)
Lymphocytes Relative: 22.6 % (ref 12.0–46.0)
Lymphs Abs: 1.3 10*3/uL (ref 0.7–4.0)
MCHC: 34.6 g/dL (ref 30.0–36.0)
MCV: 91.7 fl (ref 78.0–100.0)
Monocytes Absolute: 0.4 10*3/uL (ref 0.1–1.0)
Monocytes Relative: 7.6 % (ref 3.0–12.0)
Neutro Abs: 3.8 10*3/uL (ref 1.4–7.7)
Neutrophils Relative %: 66.2 % (ref 43.0–77.0)
Platelets: 197 10*3/uL (ref 150.0–400.0)
RBC: 4.81 Mil/uL (ref 4.22–5.81)
RDW: 13.1 % (ref 11.5–15.5)
WBC: 5.8 10*3/uL (ref 4.0–10.5)

## 2021-03-30 LAB — BASIC METABOLIC PANEL
BUN: 13 mg/dL (ref 6–23)
CO2: 30 mEq/L (ref 19–32)
Calcium: 10 mg/dL (ref 8.4–10.5)
Chloride: 104 mEq/L (ref 96–112)
Creatinine, Ser: 0.8 mg/dL (ref 0.40–1.50)
GFR: 89.53 mL/min (ref >60.00)
Glucose, Bld: 71 mg/dL (ref 70–99)
Potassium: 4.4 mEq/L (ref 3.5–5.1)
Sodium: 140 mEq/L (ref 135–145)

## 2021-03-30 MED ORDER — HYDROCODONE-ACETAMINOPHEN 5-325 MG PO TABS: 1.0000 | ORAL_TABLET | Freq: Four times a day (QID) | ORAL | 0 refills | Status: DC | PRN

## 2021-03-30 NOTE — Progress Notes (Signed)
Subjective:  Patient ID: Mark Cohen, male    DOB: 1951-05-23  Age: 70 y.o. MRN: [id number]  CC: Flank Pain  This visit occurred during the SARS-CoV-2 public health emergency.  Safety protocols were in place, including screening questions prior to the visit, additional usage of staff PPE, and extensive cleaning of exam room while observing appropriate contact time as indicated for disinfecting solutions.    HPI Mark Cohen presents for f/up-  He complains of a 64-month history of left posterior flank pain.  He describes it as a sharp sensation when he leans forward.  He gets symptom relief with the occasional dose of naproxen.  He denies cough, abdominal pain, weight loss, dysuria, or hematuria.  Outpatient Medications Prior to Visit  Medication Sig Dispense Refill   ALPRAZolam (XANAX) 0.25 MG tablet Take 1 tablet (0.25 mg total) by mouth 2 (two) times daily as needed. 35 tablet 3   cholecalciferol (VITAMIN D) 1000 UNITS tablet Take 5,000 Units by mouth daily.     HYDROcodone-acetaminophen (NORCO/VICODIN) 5-325 MG tablet Take 1 tablet by mouth every 6 (six) hours as needed. 65 tablet 0   rosuvastatin (CRESTOR) 5 MG tablet Take 1 tablet (5 mg total) by mouth daily. 90 tablet 1   No facility-administered medications prior to visit.    ROS Review of Systems  Constitutional: Negative.  Negative for diaphoresis, fatigue and unexpected weight change.  HENT: Negative.  Negative for trouble swallowing.   Eyes: Negative.   Respiratory:  Negative for cough, chest tightness, shortness of breath and wheezing.   Cardiovascular:  Negative for chest pain, palpitations and leg swelling.  Gastrointestinal:  Negative for abdominal pain, constipation, diarrhea, nausea and vomiting.  Endocrine: Negative.   Genitourinary:  Positive for flank pain. Negative for difficulty urinating, dysuria and hematuria.  Musculoskeletal:  Positive for arthralgias and back pain. Negative for myalgias.   Skin: Negative.  Negative for color change and pallor.  Neurological: Negative.  Negative for dizziness, weakness, light-headedness and headaches.  Hematological:  Negative for adenopathy. Does not bruise/bleed easily.   Objective:  BP 136/84 (BP Location: Left Arm, Patient Position: Sitting, Cuff Size: Large)   Pulse 89   Temp 98.2 F (36.8 C) (Oral)   Resp 16   Ht 6' (1.829 m)   Wt 167 lb (75.8 kg)   SpO2 98%   BMI 22.65 kg/m   BP Readings from Last 3 Encounters:  03/30/21 136/84  07/22/20 126/84  05/27/19 138/84    Wt Readings from Last 3 Encounters:  03/30/21 167 lb (75.8 kg)  07/22/20 168 lb (76.2 kg)  05/27/19 168 lb (76.2 kg)    Physical Exam Vitals reviewed.  Constitutional:      Appearance: Normal appearance.  HENT:     Nose: Nose normal.     Mouth/Throat:     Mouth: Mucous membranes are moist.  Eyes:     Conjunctiva/sclera: Conjunctivae normal.  Cardiovascular:     Rate and Rhythm: Normal rate and regular rhythm.     Heart sounds: No murmur heard. Pulmonary:     Effort: Pulmonary effort is normal.     Breath sounds: No stridor. No wheezing, rhonchi or rales.  Abdominal:     General: Abdomen is flat. Bowel sounds are normal.     Palpations: There is no mass.     Tenderness: There is no abdominal tenderness. There is no right CVA tenderness, left CVA tenderness or guarding.     Hernia: No hernia is present.  Musculoskeletal:        General: Normal range of motion.     Cervical back: Neck supple.     Thoracic back: Normal. No edema or bony tenderness.     Lumbar back: Normal.     Right lower leg: No edema.     Left lower leg: No edema.  Lymphadenopathy:     Cervical: No cervical adenopathy.  Skin:    General: Skin is warm and dry.     Findings: No rash.  Neurological:     General: No focal deficit present.     Mental Status: He is alert.  Psychiatric:        Mood and Affect: Mood normal.        Behavior: Behavior normal.    Lab Results   Component Value Date   WBC 8.3 07/22/2020   HGB 15.8 07/22/2020   HCT 45.4 07/22/2020   PLT 215.0 07/22/2020   GLUCOSE 96 07/22/2020   CHOL 188 07/22/2020   TRIG 65.0 07/22/2020   HDL 91.40 07/22/2020   LDLCALC 83 07/22/2020   ALT 16 07/22/2020   AST 24 07/22/2020   NA 139 07/22/2020   K 4.6 07/22/2020   CL 101 07/22/2020   CREATININE 0.85 07/22/2020   BUN 14 07/22/2020   CO2 31 07/22/2020   TSH 1.97 07/22/2020   PSA 0.15 07/22/2020   HGBA1C 5.5 09/14/2017    DG ABD ACUTE 2+V W 1V CHEST  Result Date: 03/30/2021 CLINICAL DATA:  LEFT flank pain for 3 months, no known injury, former smoker EXAM: DG ABDOMEN ACUTE WITH 1 VIEW CHEST COMPARISON:  Chest radiographs 02/09/2017 FINDINGS: Normal heart size, mediastinal contours, and pulmonary vascularity. Emphysematous and minimal bronchitic changes consistent with COPD. Biapical scarring. No acute infiltrate, pleural effusion, or pneumothorax. Normal bowel gas pattern. No bowel dilatation, bowel wall thickening, or free air. Multiple pelvic phleboliths. No definite urinary tract calcification. 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 IMPRESSION: COPD changes with biapical scarring. No acute abdominal findings. Electronically Signed   By: Lavonia Dana M.D.   On: 03/30/2021 16:40     Assessment & Plan:   Mark Cohen was seen today for flank pain.  Diagnoses and all orders for this visit:  Chronic left flank pain- Plain films, labs, and examination are all reassuring.  I think this is musculoskeletal pain.  Will continue the current dose of hydrocodone/acetaminophen. -     CBC with Differential/Platelet; Future -     Basic metabolic panel; Future -     Urinalysis, Routine w reflex microscopic; Future -     DG ABD ACUTE 2+V W 1V CHEST; Future -     Urinalysis, Routine w reflex microscopic -     Basic metabolic panel -     CBC with Differential/Platelet  Left lumbar radiculitis -      HYDROcodone-acetaminophen (NORCO/VICODIN) 5-325 MG tablet; Take 1 tablet by mouth every 6 (six) hours as needed.  Primary osteoarthritis involving multiple joints -     HYDROcodone-acetaminophen (NORCO/VICODIN) 5-325 MG tablet; Take 1 tablet by mouth every 6 (six) hours as needed.  I have discontinued Mark Cohen "Mark Cohen"'s rosuvastatin. I am also having him maintain his cholecalciferol, ALPRAZolam, and HYDROcodone-acetaminophen.  Meds ordered this encounter  Medications   HYDROcodone-acetaminophen (NORCO/VICODIN) 5-325 MG tablet    Sig: Take 1 tablet by mouth every 6 (six) hours as needed.    Dispense:  65 tablet    Refill:  0  Follow-up: Return in about 3 weeks (around 04/20/2021).  Scarlette Calico, MD

## 2021-03-30 NOTE — Patient Instructions (Signed)
Flank Pain, Adult ?Flank pain is pain that is located on the side of the body between the upper abdomen and the spine. This area is called the flank. The pain may occur over a short period of time (acute), or it may be long-term or recurring (chronic). It may be mild or severe. Flank pain can be caused by many things, including: ?Muscle soreness or injury. ?Kidney infection, kidney stones, or kidney disease. ?Stress. ?A disease of the spine (vertebral disk disease). ?A lung infection (pneumonia). ?Fluid around the lungs (pulmonary edema). ?A skin rash caused by the chickenpox virus (shingles). ?Tumors that affect the back of the abdomen. ?Gallbladder disease. ?Follow these instructions at home: ? ?Drink enough fluid to keep your urine pale yellow. ?Rest as told by your health care provider. ?Take over-the-counter and prescription medicines only as told by your health care provider. ?Keep a journal to track what has caused your flank pain and what has made it feel better. ?Keep all follow-up visits. This is important. ?Contact a health care provider if: ?Your pain is not controlled with medicine. ?You have new symptoms. ?Your pain gets worse. ?Your symptoms last longer than 2-3 days. ?You have trouble urinating or you are urinating very frequently. ?Get help right away if: ?You have trouble breathing or you are short of breath. ?Your abdomen hurts or it is swollen or red. ?You have nausea or vomiting. ?You feel faint, or you faint. ?You have blood in your urine. ?You have flank pain and a fever. ?These symptoms may represent a serious problem that is an emergency. Do not wait to see if the symptoms will go away. Get medical help right away. Call your local emergency services (911 in the [place] Do not drive yourself to the hospital. ?Summary ?Flank pain is pain that is located on the side of the body between the upper abdomen and the spine. ?The pain may occur over a short period of time (acute), or it may be  long-term or recurring (chronic). It may be mild or severe. ?Flank pain can be caused by many things. ?Contact your health care provider if your symptoms get worse or last longer than 2-3 days. ?This information is not intended to replace advice given to you by your health care provider. Make sure you discuss any questions you have with your health care provider. ?Document Revised: 09/07/2020 Document Reviewed: 09/07/2020 ?Elsevier Patient Education ? 2022 [employer] ? ?

## 2021-03-31 LAB — URINALYSIS, ROUTINE W REFLEX MICROSCOPIC
Bilirubin Urine: NEGATIVE
Hgb urine dipstick: NEGATIVE
Ketones, ur: NEGATIVE
Leukocytes,Ua: NEGATIVE
Nitrite: NEGATIVE
Specific Gravity, Urine: 1.02 (ref 1.000–1.030)
Total Protein, Urine: NEGATIVE
Urine Glucose: NEGATIVE
Urobilinogen, UA: 0.2 — AB (ref 0.0–1.0)
pH: 6 (ref 5.0–8.0)

## 2021-04-05 ENCOUNTER — Other Ambulatory Visit: Payer: Self-pay

## 2021-04-05 ENCOUNTER — Ambulatory Visit: Payer: Medicare HMO | Admitting: Dermatology

## 2021-04-05 DIAGNOSIS — Z1283 Encounter for screening for malignant neoplasm of skin: Secondary | ICD-10-CM | POA: Diagnosis not present

## 2021-04-05 DIAGNOSIS — R208 Other disturbances of skin sensation: Secondary | ICD-10-CM

## 2021-04-05 DIAGNOSIS — L821 Other seborrheic keratosis: Secondary | ICD-10-CM | POA: Diagnosis not present

## 2021-04-05 NOTE — Patient Instructions (Signed)
Pramoxine hydrochloride itch relief cream  To relieve pain and soothe the itching associated with eczema, dry skin, minor skin irritations, insect bites and, a fast-acting, long-lasting itch relief cream can help. And one that's formulated with ceramides to help restore the skin's natural barrier, which helps maintain moisture to keep your skin soft.  Created for all skin types, CeraVe Itch Relief Moisturizing Cream

## 2021-04-19 ENCOUNTER — Encounter: Payer: Self-pay | Admitting: Dermatology

## 2021-04-19 NOTE — Progress Notes (Signed)
   Follow-Up Visit   Subjective  Mark Cohen is a 70 y.o. male who presents for the following: Annual Exam (Here for annual skin exam. Concerns ears burn when outside. No history of skin cancers. ).  General skin examination.  Ears itch, sunlight triggers. Location:  Duration:  Quality:  Associated Signs/Symptoms: Modifying Factors:  Severity:  Timing: Context:   Objective  Well appearing patient in no apparent distress; mood and affect are within normal limits. Torso - Posterior (Back) Many keratoses on the back and arms   Right Ear Both ears itch no lesions present     All skin waist up examined.   Assessment & Plan    Seborrheic keratosis Torso - Posterior (Back)  Keep yearly skin checks if seb k are stable they are safe to leave   Dysesthesia Right Ear  Pramoxine hydrochloride itch relief cream  To relieve pain and soothe the itching associated with eczema, dry skin, minor skin irritations, insect bites and, a fast-acting, long-lasting itch relief cream can help. And one that's formulated with ceramides to help restore the skin's natural barrier, which helps maintain moisture to keep your skin soft.  Created for all skin types, CeraVe Itch Relief Moisturizing Cream    Addendum: I did some research after the patient's visit. 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 There actually have been adult onset cases, some associated with myeloproliferative disorders. It the topical pramoxine is not helpful, perhaps a red cell protoporphyrin level will be indicated.   I, Lavonna Monarch, MD, have reviewed all documentation for this visit.  The documentation on 04/19/21 for the exam, diagnosis, procedures, and orders are all accurate and complete.

## 2021-04-20 ENCOUNTER — Other Ambulatory Visit: Payer: Self-pay | Admitting: Internal Medicine

## 2021-04-20 DIAGNOSIS — F411 Generalized anxiety disorder: Secondary | ICD-10-CM

## 2021-07-13 DIAGNOSIS — Z961 Presence of intraocular lens: Secondary | ICD-10-CM | POA: Diagnosis not present

## 2021-07-13 DIAGNOSIS — H524 Presbyopia: Secondary | ICD-10-CM | POA: Diagnosis not present

## 2021-09-13 ENCOUNTER — Encounter: Payer: Self-pay | Admitting: Internal Medicine

## 2021-10-12 ENCOUNTER — Ambulatory Visit (INDEPENDENT_AMBULATORY_CARE_PROVIDER_SITE_OTHER): Payer: Medicare HMO | Admitting: Internal Medicine

## 2021-10-12 ENCOUNTER — Encounter: Payer: Self-pay | Admitting: Internal Medicine

## 2021-10-12 VITALS — BP 134/82 | HR 93 | Temp 97.9°F | Ht 72.0 in | Wt 169.0 lb

## 2021-10-12 DIAGNOSIS — M5416 Radiculopathy, lumbar region: Secondary | ICD-10-CM | POA: Diagnosis not present

## 2021-10-12 DIAGNOSIS — E785 Hyperlipidemia, unspecified: Secondary | ICD-10-CM

## 2021-10-12 DIAGNOSIS — M159 Polyosteoarthritis, unspecified: Secondary | ICD-10-CM

## 2021-10-12 DIAGNOSIS — N400 Enlarged prostate without lower urinary tract symptoms: Secondary | ICD-10-CM

## 2021-10-12 DIAGNOSIS — K219 Gastro-esophageal reflux disease without esophagitis: Secondary | ICD-10-CM | POA: Diagnosis not present

## 2021-10-12 DIAGNOSIS — Z0000 Encounter for general adult medical examination without abnormal findings: Secondary | ICD-10-CM

## 2021-10-12 DIAGNOSIS — I10 Essential (primary) hypertension: Secondary | ICD-10-CM | POA: Diagnosis not present

## 2021-10-12 DIAGNOSIS — K635 Polyp of colon: Secondary | ICD-10-CM

## 2021-10-12 DIAGNOSIS — Z23 Encounter for immunization: Secondary | ICD-10-CM

## 2021-10-12 LAB — HEPATIC FUNCTION PANEL
ALT: 17 U/L (ref 0–53)
AST: 26 U/L (ref 0–37)
Albumin: 5 g/dL (ref 3.5–5.2)
Alkaline Phosphatase: 85 U/L (ref 39–117)
Bilirubin, Direct: 0.2 mg/dL (ref 0.0–0.3)
Total Bilirubin: 0.9 mg/dL (ref 0.2–1.2)
Total Protein: 7.1 g/dL (ref 6.0–8.3)

## 2021-10-12 LAB — CBC WITH DIFFERENTIAL/PLATELET
Basophils Absolute: 0 10*3/uL (ref 0.0–0.1)
Basophils Relative: 0.5 % (ref 0.0–3.0)
Eosinophils Absolute: 0.2 10*3/uL (ref 0.0–0.7)
Eosinophils Relative: 1.9 % (ref 0.0–5.0)
HCT: 45.3 % (ref 39.0–52.0)
Hemoglobin: 15.7 g/dL (ref 13.0–17.0)
Lymphocytes Relative: 23.9 % (ref 12.0–46.0)
Lymphs Abs: 1.9 10*3/uL (ref 0.7–4.0)
MCHC: 34.6 g/dL (ref 30.0–36.0)
MCV: 91.5 fl (ref 78.0–100.0)
Monocytes Absolute: 0.5 10*3/uL (ref 0.1–1.0)
Monocytes Relative: 6.6 % (ref 3.0–12.0)
Neutro Abs: 5.3 10*3/uL (ref 1.4–7.7)
Neutrophils Relative %: 67.1 % (ref 43.0–77.0)
Platelets: 221 10*3/uL (ref 150.0–400.0)
RBC: 4.95 Mil/uL (ref 4.22–5.81)
RDW: 13.4 % (ref 11.5–15.5)
WBC: 7.8 10*3/uL (ref 4.0–10.5)

## 2021-10-12 LAB — LIPID PANEL
Cholesterol: 186 mg/dL (ref 0–200)
HDL: 95.3 mg/dL (ref >39.00)
LDL Cholesterol: 70 mg/dL (ref 0–99)
NonHDL: 91.07
Total CHOL/HDL Ratio: 2
Triglycerides: 103 mg/dL (ref 0.0–149.0)
VLDL: 20.6 mg/dL (ref 0.0–40.0)

## 2021-10-12 LAB — PSA: PSA: 0.1 ng/mL (ref 0.10–4.00)

## 2021-10-12 LAB — TSH: TSH: 1.82 u[IU]/mL (ref 0.35–5.50)

## 2021-10-12 MED ORDER — SHINGRIX 50 MCG/0.5ML IM SUSR: 0.5000 mL | Freq: Once | INTRAMUSCULAR | 1 refills | Status: AC

## 2021-10-12 MED ORDER — HYDROCODONE-ACETAMINOPHEN 5-325 MG PO TABS: 1.0000 | ORAL_TABLET | Freq: Four times a day (QID) | ORAL | 0 refills | Status: DC | PRN

## 2021-10-12 MED ORDER — ESOMEPRAZOLE MAGNESIUM 40 MG PO CPDR: 40.0000 mg | DELAYED_RELEASE_CAPSULE | Freq: Every day | ORAL | 1 refills | Status: DC

## 2021-10-12 NOTE — Progress Notes (Signed)
? ?Subjective:  ?Patient ID: Mark Cohen, male    DOB: [birth year]  Age: 71 y.o. MRN: [id number] ? ?CC: Annual Exam, Gastroesophageal Reflux, Back Pain, and Osteoarthritis ? ? ?HPI ?[name] presents for a CPX and f/up -  ? ?He is active and denies chest pain, shortness of breath, diaphoresis, or edema.  He continues to complain of low back pain.  He complains of heartburn and indigestion.  He denies odynophagia, dysphagia, abdominal pain, nausea, or vomiting.  He has had a few episodes of bright red blood per rectum but no melena. ? ?Outpatient Medications Prior to Visit  ?Medication Sig Dispense Refill  ? ALPRAZolam (XANAX) 0.25 MG tablet TAKE ONE TABLET BY MOUTH TWICE A DAY AS NEEDED 35 tablet 3  ? cholecalciferol (VITAMIN D) 1000 UNITS tablet Take 5,000 Units by mouth daily.    ? HYDROcodone-acetaminophen (NORCO/VICODIN) 5-325 MG tablet Take 1 tablet by mouth every 6 (six) hours as needed. 65 tablet 0  ? ?No facility-administered medications prior to visit.  ? ? ?ROS ?Review of Systems  ?Constitutional:  Negative for chills, diaphoresis, fatigue and fever.  ?HENT: Negative.  Negative for trouble swallowing.   ?Eyes: Negative.   ?Respiratory: Negative.  Negative for cough, chest tightness, shortness of breath and wheezing.   ?Cardiovascular:  Negative for chest pain, palpitations and leg swelling.  ?Gastrointestinal:  Positive for anal bleeding and blood in stool. Negative for abdominal pain, constipation, diarrhea, nausea and vomiting.  ?Endocrine: Negative.   ?Genitourinary: Negative.  Negative for difficulty urinating, dysuria and hematuria.  ?Musculoskeletal:  Positive for arthralgias and back pain. Negative for myalgias.  ?Skin: Negative.   ?Neurological: Negative.  Negative for dizziness, weakness, light-headedness and numbness.  ?Hematological: Negative.  Negative for adenopathy. Does not bruise/bleed easily.  ?Psychiatric/Behavioral: Negative.    ? ?Objective:  ?BP 134/82 (BP Location: Right  Arm, Patient Position: Sitting, Cuff Size: Normal)   Pulse 93   Temp 97.9 ?F (36.6 ?C) (Oral)   Ht 6' (1.829 m)   Wt 169 lb (76.7 kg)   SpO2 95%   BMI 22.92 kg/m?  ? ?BP Readings from Last 3 Encounters:  ?10/12/21 134/82  ?03/30/21 136/84  ?07/22/20 126/84  ? ? ?Wt Readings from Last 3 Encounters:  ?10/12/21 169 lb (76.7 kg)  ?03/30/21 167 lb (75.8 kg)  ?07/22/20 168 lb (76.2 kg)  ? ? ?Physical Exam ?Vitals reviewed.  ?Constitutional:   ?   Appearance: He is not ill-appearing.  ?HENT:  ?   Nose: Nose normal.  ?   Mouth/Throat:  ?   Mouth: Mucous membranes are moist.  ?Eyes:  ?   General: No scleral icterus. ?   Conjunctiva/sclera: Conjunctivae normal.  ?Cardiovascular:  ?   Rate and Rhythm: Normal rate and regular rhythm.  ?   Heart sounds: No murmur heard. ?Pulmonary:  ?   Effort: Pulmonary effort is normal.  ?   Breath sounds: No stridor. No wheezing, rhonchi or rales.  ?Abdominal:  ?   General: Abdomen is flat.  ?   Palpations: There is no mass.  ?   Tenderness: There is no abdominal tenderness. There is no guarding.  ?   Hernia: No hernia is present. There is no hernia in the left inguinal area or right inguinal area.  ?Genitourinary: ?   Pubic Area: No rash.   ?   Penis: Normal and circumcised.   ?   Testes: Normal.  ?   Epididymis:  ?   Right: Normal.  Not inflamed or enlarged. No mass.  ?   Left: Normal. Not inflamed or enlarged. No mass.  ?   Prostate: Enlarged. Not tender and no nodules present.  ?   Rectum: Normal. Guaiac result negative. No mass, tenderness, anal fissure, external hemorrhoid or internal hemorrhoid. Normal anal tone.  ?Musculoskeletal:     ?   General: Normal range of motion.  ?   Cervical back: Neck supple.  ?   Thoracic back: Normal.  ?   Lumbar back: Normal. No bony tenderness. Normal range of motion. Negative right straight leg raise test and negative left straight leg raise test.  ?   Right lower leg: No edema.  ?   Left lower leg: No edema.  ?Lymphadenopathy:  ?   Cervical: No  cervical adenopathy.  ?   Lower Body: No right inguinal adenopathy. No left inguinal adenopathy.  ?Skin: ?   General: Skin is warm and dry.  ?Neurological:  ?   General: No focal deficit present.  ?   Mental Status: He is alert. Mental status is at baseline.  ?Psychiatric:     ?   Mood and Affect: Mood normal.  ? ? ?Lab Results  ?Component Value Date  ? WBC 7.8 10/12/2021  ? HGB 15.7 10/12/2021  ? HCT 45.3 10/12/2021  ? PLT 221.0 10/12/2021  ? GLUCOSE 71 03/30/2021  ? CHOL 186 10/12/2021  ? TRIG 103.0 10/12/2021  ? HDL 95.30 10/12/2021  ? [place] 70 10/12/2021  ? ALT 17 10/12/2021  ? AST 26 10/12/2021  ? NA 140 03/30/2021  ? K 4.4 03/30/2021  ? CL 104 03/30/2021  ? CREATININE 0.80 03/30/2021  ? BUN 13 03/30/2021  ? CO2 30 03/30/2021  ? TSH 1.82 10/12/2021  ? PSA 0.10 10/12/2021  ? HGBA1C 5.5 09/14/2017  ? ? ?No results found. ? ?Assessment & Plan:  ? ?Pranshu was seen today for annual exam, gastroesophageal reflux, back pain and osteoarthritis. ? ?Diagnoses and all orders for this visit: ? ?Essential hypertension, benign- His blood pressure is well controlled with lifestyle modifications. ?-     TSH; Future ?-     Urinalysis, Routine w reflex microscopic; Future ?-     Hepatic function panel; Future ?-     CBC with Differential/Platelet; Future ?-     CBC with Differential/Platelet ?-     Hepatic function panel ?-     Urinalysis, Routine w reflex microscopic ?-     TSH ? ?Left lumbar radiculitis ?-     HYDROcodone-acetaminophen (NORCO/VICODIN) 5-325 MG tablet; Take 1 tablet by mouth every 6 (six) hours as needed. ? ?Primary osteoarthritis involving multiple joints ?-     HYDROcodone-acetaminophen (NORCO/VICODIN) 5-325 MG tablet; Take 1 tablet by mouth every 6 (six) hours as needed. ? ?Benign prostatic hyperplasia without lower urinary tract symptoms- His PSA is reassuringly low. ?-     PSA; Future ?-     Urinalysis, Routine w reflex microscopic; Future ?-     Urinalysis, Routine w reflex microscopic ?-      PSA ? ?Routine general medical examination at a health care facility-exam completed, labs reviewed, vaccines reviewed; he refused a pneumonia vaccine, cancer screenings addressed, patient education was given. ? ?Hyperlipidemia with target LDL less than 130- His ASCVD risk score is only 15% so I did not recommend a statin for CV risk reduction. ?-     Lipid panel; Future ?-     Hepatic function panel; Future ?-  Hepatic function panel ?-     Lipid panel ? ?Gastroesophageal reflux disease without esophagitis ?-     CBC with Differential/Platelet; Future ?-     esomeprazole (NEXIUM) 40 MG capsule; Take 1 capsule (40 mg total) by mouth daily. ?-     CBC with Differential/Platelet ? ?Polyp of colon, unspecified part of colon, unspecified type ?-     Ambulatory referral to Gastroenterology ? ?Need for shingles vaccine ?-     Zoster Vaccine Adjuvanted [facility]) injection; Inject 0.5 mLs into the muscle once for 1 dose. ? ? ?I am having [name] "JOE" start on esomeprazole and Shingrix. I am also having him maintain his cholecalciferol, ALPRAZolam, and HYDROcodone-acetaminophen. ? ?Meds ordered this encounter  ?Medications  ? HYDROcodone-acetaminophen (NORCO/VICODIN) 5-325 MG tablet  ?  Sig: Take 1 tablet by mouth every 6 (six) hours as needed.  ?  Dispense:  65 tablet  ?  Refill:  0  ? esomeprazole (NEXIUM) 40 MG capsule  ?  Sig: Take 1 capsule (40 mg total) by mouth daily.  ?  Dispense:  90 capsule  ?  Refill:  1  ? Zoster Vaccine Adjuvanted [facility]) injection  ?  Sig: Inject 0.5 mLs into the muscle once for 1 dose.  ?  Dispense:  0.5 mL  ?  Refill:  1  ? ? ? ?Follow-up: Return in about 6 months (around 04/13/2022). ? ?Scarlette Calico, MD ?

## 2021-10-12 NOTE — Patient Instructions (Signed)

## 2021-10-13 LAB — URINALYSIS, ROUTINE W REFLEX MICROSCOPIC
Bilirubin Urine: NEGATIVE
Hgb urine dipstick: NEGATIVE
Ketones, ur: NEGATIVE
Leukocytes,Ua: NEGATIVE
Nitrite: NEGATIVE
RBC / HPF: NONE SEEN (ref 0–?)
Specific Gravity, Urine: 1.025 (ref 1.000–1.030)
Total Protein, Urine: NEGATIVE
Urine Glucose: NEGATIVE
Urobilinogen, UA: 0.2 (ref 0.0–1.0)
pH: 5.5 (ref 5.0–8.0)

## 2021-10-18 ENCOUNTER — Telehealth: Payer: Self-pay | Admitting: Internal Medicine

## 2021-10-18 ENCOUNTER — Encounter: Payer: Self-pay | Admitting: Internal Medicine

## 2021-10-18 ENCOUNTER — Other Ambulatory Visit: Payer: Self-pay | Admitting: Internal Medicine

## 2021-10-18 DIAGNOSIS — M5416 Radiculopathy, lumbar region: Secondary | ICD-10-CM

## 2021-10-18 DIAGNOSIS — M159 Polyosteoarthritis, unspecified: Secondary | ICD-10-CM

## 2021-10-18 MED ORDER — HYDROCODONE-ACETAMINOPHEN 10-325 MG PO TABS: 1.0000 | ORAL_TABLET | Freq: Three times a day (TID) | ORAL | 0 refills | Status: DC | PRN

## 2021-10-18 MED ORDER — HYDROCODONE-ACETAMINOPHEN 10-325 MG PO TABS: 1.0000 | ORAL_TABLET | Freq: Three times a day (TID) | ORAL | 0 refills | Status: DC

## 2021-10-18 NOTE — Telephone Encounter (Signed)
Called pharmacy, LVM informing that pt should only be taking Norco 10-'325mg'$  take one tablet every 8hrs.  ? ? ?

## 2021-10-18 NOTE — Telephone Encounter (Signed)
Received rx on HYDROcodone-acetaminophen (NORCO) 10-325 MG tablet.  ? ?Sent over quantity 65 tab on April 4th with instruction to take every 6 hours. Then received another rx today for different dosage and instruction.  ? ?Calling to get clarification. Please follow up with pharmacy. Lunch at 2pm- LVM if unavailable.  ?

## 2021-11-17 ENCOUNTER — Encounter: Payer: Self-pay | Admitting: Internal Medicine

## 2021-12-06 ENCOUNTER — Other Ambulatory Visit: Payer: Self-pay | Admitting: Internal Medicine

## 2021-12-06 DIAGNOSIS — F411 Generalized anxiety disorder: Secondary | ICD-10-CM

## 2021-12-10 DIAGNOSIS — K921 Melena: Secondary | ICD-10-CM | POA: Diagnosis not present

## 2021-12-10 DIAGNOSIS — R1013 Epigastric pain: Secondary | ICD-10-CM | POA: Diagnosis not present

## 2021-12-10 DIAGNOSIS — Z86010 Personal history of colonic polyps: Secondary | ICD-10-CM | POA: Diagnosis not present

## 2021-12-23 ENCOUNTER — Ambulatory Visit: Payer: Medicare HMO | Admitting: Gastroenterology

## 2022-01-13 DIAGNOSIS — Z86010 Personal history of colonic polyps: Secondary | ICD-10-CM | POA: Diagnosis not present

## 2022-01-13 DIAGNOSIS — R1013 Epigastric pain: Secondary | ICD-10-CM | POA: Diagnosis not present

## 2022-01-13 DIAGNOSIS — K5730 Diverticulosis of large intestine without perforation or abscess without bleeding: Secondary | ICD-10-CM | POA: Diagnosis not present

## 2022-01-13 DIAGNOSIS — K921 Melena: Secondary | ICD-10-CM | POA: Diagnosis not present

## 2022-01-13 DIAGNOSIS — K2289 Other specified disease of esophagus: Secondary | ICD-10-CM | POA: Diagnosis not present

## 2022-01-13 DIAGNOSIS — K2930 Chronic superficial gastritis without bleeding: Secondary | ICD-10-CM | POA: Diagnosis not present

## 2022-01-13 DIAGNOSIS — K648 Other hemorrhoids: Secondary | ICD-10-CM | POA: Diagnosis not present

## 2022-01-13 DIAGNOSIS — K229 Disease of esophagus, unspecified: Secondary | ICD-10-CM | POA: Diagnosis not present

## 2022-01-13 DIAGNOSIS — K2970 Gastritis, unspecified, without bleeding: Secondary | ICD-10-CM | POA: Diagnosis not present

## 2022-01-20 DIAGNOSIS — K2289 Other specified disease of esophagus: Secondary | ICD-10-CM | POA: Diagnosis not present

## 2022-01-20 DIAGNOSIS — K2930 Chronic superficial gastritis without bleeding: Secondary | ICD-10-CM | POA: Diagnosis not present

## 2022-02-03 DIAGNOSIS — H93291 Other abnormal auditory perceptions, right ear: Secondary | ICD-10-CM | POA: Diagnosis not present

## 2022-03-03 ENCOUNTER — Ambulatory Visit: Payer: Medicare HMO | Admitting: Internal Medicine

## 2022-04-05 ENCOUNTER — Ambulatory Visit: Payer: Medicare HMO | Admitting: Dermatology

## 2022-04-07 ENCOUNTER — Telehealth: Payer: Self-pay | Admitting: Internal Medicine

## 2022-04-07 ENCOUNTER — Ambulatory Visit (INDEPENDENT_AMBULATORY_CARE_PROVIDER_SITE_OTHER): Payer: Medicare HMO | Admitting: Internal Medicine

## 2022-04-07 ENCOUNTER — Ambulatory Visit (INDEPENDENT_AMBULATORY_CARE_PROVIDER_SITE_OTHER): Payer: Medicare HMO

## 2022-04-07 ENCOUNTER — Encounter: Payer: Self-pay | Admitting: Internal Medicine

## 2022-04-07 ENCOUNTER — Other Ambulatory Visit: Payer: Self-pay | Admitting: Internal Medicine

## 2022-04-07 VITALS — BP 132/80 | HR 79 | Temp 98.0°F | Ht 72.0 in | Wt 170.0 lb

## 2022-04-07 DIAGNOSIS — M159 Polyosteoarthritis, unspecified: Secondary | ICD-10-CM

## 2022-04-07 DIAGNOSIS — M25551 Pain in right hip: Secondary | ICD-10-CM

## 2022-04-07 DIAGNOSIS — M25552 Pain in left hip: Secondary | ICD-10-CM

## 2022-04-07 DIAGNOSIS — Z23 Encounter for immunization: Secondary | ICD-10-CM

## 2022-04-07 DIAGNOSIS — M5416 Radiculopathy, lumbar region: Secondary | ICD-10-CM | POA: Diagnosis not present

## 2022-04-07 DIAGNOSIS — K219 Gastro-esophageal reflux disease without esophagitis: Secondary | ICD-10-CM | POA: Diagnosis not present

## 2022-04-07 DIAGNOSIS — M545 Low back pain, unspecified: Secondary | ICD-10-CM

## 2022-04-07 DIAGNOSIS — M150 Primary generalized (osteo)arthritis: Secondary | ICD-10-CM

## 2022-04-07 DIAGNOSIS — G8929 Other chronic pain: Secondary | ICD-10-CM

## 2022-04-07 MED ORDER — SHINGRIX 50 MCG/0.5ML IM SUSR: 0.5000 mL | Freq: Once | INTRAMUSCULAR | 1 refills | Status: AC

## 2022-04-07 MED ORDER — HYDROCODONE-ACETAMINOPHEN 10-325 MG PO TABS: 1.0000 | ORAL_TABLET | Freq: Three times a day (TID) | ORAL | 0 refills | Status: DC

## 2022-04-07 MED ORDER — ESOMEPRAZOLE MAGNESIUM 40 MG PO CPDR: 40.0000 mg | DELAYED_RELEASE_CAPSULE | Freq: Every day | ORAL | 1 refills | Status: DC

## 2022-04-07 NOTE — Progress Notes (Unsigned)
Subjective:  Patient ID: Mark Cohen, male    DOB: [birth year]  Age: 71 y.o. MRN: [id number]  CC: Osteoarthritis and Back Pain   HPI Mark Cohen presents for f/up -  Mark Cohen continues to c/o LBP and hip pain (soreness). The pain does not radiate and Mark Cohen denies paresthesias. The pain is well controlled with hydrocodone and Mark Cohen requests a refill.  Outpatient Medications Prior to Visit  Medication Sig Dispense Refill   ALPRAZolam (XANAX) 0.25 MG tablet TAKE ONE TABLET BY MOUTH TWICE A DAY AS NEEDED 35 tablet 3   cholecalciferol (VITAMIN D) 1000 UNITS tablet Take 5,000 Units by mouth daily.     esomeprazole (NEXIUM) 40 MG capsule Take 1 capsule (40 mg total) by mouth daily. 90 capsule 1   HYDROcodone-acetaminophen (NORCO) 10-325 MG tablet Take 1 tablet by mouth every 8 (eight) hours. 90 tablet 0   No facility-administered medications prior to visit.    ROS Review of Systems  Constitutional:  Negative for chills, diaphoresis, fatigue and fever.  HENT: Negative.    Eyes: Negative.   Respiratory:  Negative for cough, chest tightness, shortness of breath and wheezing.   Cardiovascular:  Negative for chest pain, palpitations and leg swelling.  Gastrointestinal:  Negative for abdominal pain, constipation, diarrhea, nausea and vomiting.  Endocrine: Negative.   Genitourinary: Negative.  Negative for difficulty urinating.  Musculoskeletal:  Positive for arthralgias and back pain. Negative for myalgias and neck pain.  Skin: Negative.   Allergic/Immunologic: Negative.   Neurological: Negative.  Negative for dizziness and weakness.  Hematological:  Negative for adenopathy. Does not bruise/bleed easily.  Psychiatric/Behavioral: Negative.      Objective:  BP 132/80 (BP Location: Left Arm, Patient Position: Sitting, Cuff Size: Large)   Pulse 79   Temp 98 F (36.7 C) (Oral)   Ht 6' (1.829 m)   Wt 170 lb (77.1 kg)   SpO2 97%   BMI 23.06 kg/m   BP Readings from Last 3 Encounters:   04/07/22 132/80  10/12/21 134/82  03/30/21 136/84    Wt Readings from Last 3 Encounters:  04/07/22 170 lb (77.1 kg)  10/12/21 169 lb (76.7 kg)  03/30/21 167 lb (75.8 kg)    Physical Exam Vitals reviewed.  HENT:     Head: Normocephalic.     Nose: Nose normal.     Mouth/Throat:     Mouth: Mucous membranes are moist.  Eyes:     General: No scleral icterus.    Conjunctiva/sclera: Conjunctivae normal.  Cardiovascular:     Rate and Rhythm: Normal rate and regular rhythm.     Heart sounds: No murmur heard. Pulmonary:     Effort: Pulmonary effort is normal.     Breath sounds: No stridor. No wheezing, rhonchi or rales.  Abdominal:     General: Abdomen is flat.     Palpations: There is no mass.     Tenderness: There is no abdominal tenderness. There is no guarding.     Hernia: No hernia is present.  Musculoskeletal:        General: Normal range of motion.     Cervical back: Neck supple.     Right lower leg: No edema.     Left lower leg: No edema.  Lymphadenopathy:     Cervical: No cervical adenopathy.  Skin:    General: Skin is warm and dry.  Neurological:     General: No focal deficit present.     Mental Status: Mark Cohen is alert.  Psychiatric:        Mood and Affect: Mood normal.        Behavior: Behavior normal.     Lab Results  Component Value Date   WBC 7.8 10/12/2021   HGB 15.7 10/12/2021   HCT 45.3 10/12/2021   PLT 221.0 10/12/2021   GLUCOSE 71 03/30/2021   CHOL 186 10/12/2021   TRIG 103.0 10/12/2021   HDL 95.30 10/12/2021   LDLCALC 70 10/12/2021   ALT 17 10/12/2021   AST 26 10/12/2021   NA 140 03/30/2021   K 4.4 03/30/2021   CL 104 03/30/2021   CREATININE 0.80 03/30/2021   BUN 13 03/30/2021   CO2 30 03/30/2021   TSH 1.82 10/12/2021   PSA 0.10 10/12/2021   HGBA1C 5.5 09/14/2017   DG HIPS BILAT WITH PELVIS MIN 5 VIEWS  Result Date: 04/09/2022 CLINICAL DATA:  Pain. Patient c/o chronic low back and bilateral hip pain. NKI. EXAM: DG HIP (WITH OR WITHOUT  PELVIS) 5+V BILAT COMPARISON:  None Available. FINDINGS: There is no evidence of hip fracture or dislocation of either hips. No acute displaced fracture or diastasis of the bones of the pelvis. There is no evidence of arthropathy or other focal bone abnormality. IMPRESSION: Negative for acute traumatic injury. Electronically Signed   By: Iven Finn M.D.   On: 04/09/2022 20:11   DG Lumbar Spine Complete  Result Date: 04/09/2022 CLINICAL DATA:  pain EXAM: LUMBAR SPINE - COMPLETE 4+ VIEW COMPARISON:  MRI lumbar spine 04/02/2013 FINDINGS: Five non-rib-bearing lumbar vertebral bodies with question pseudoarthrosis between the L5-S1 level- Limited evaluation due to overlapping osseous structures and overlying soft tissues. Multilevel osteophyte formation facet arthropathy. There is no evidence of lumbar spine fracture. Alignment is normal. Multilevel intervertebral disc space narrowing most prominent at the L5-S1 level. IMPRESSION: No acute displaced fracture or traumatic listhesis of the lumbar spine. Electronically Signed   By: Iven Finn M.D.   On: 04/09/2022 20:09     Assessment & Plan:   Mark Cohen was seen today for osteoarthritis and back pain.  Diagnoses and all orders for this visit:  Chronic bilateral low back pain without sciatica- Plain films are reassuring. Will continue hydrocodone. -     DG Lumbar Spine Complete; Future -     HYDROcodone-acetaminophen (NORCO) 10-325 MG tablet; Take 1 tablet by mouth every 8 (eight) hours.  Gastroesophageal reflux disease without esophagitis -     esomeprazole (NEXIUM) 40 MG capsule; Take 1 capsule (40 mg total) by mouth daily.  Chronic pain of both hips -     Cancel: DG HIPS BILAT WITH PELVIS 3-4 VIEWS; Future -     HYDROcodone-acetaminophen (NORCO) 10-325 MG tablet; Take 1 tablet by mouth every 8 (eight) hours.  Need for prophylactic vaccination and inoculation against varicella -     Zoster Vaccine Adjuvanted [facility]) injection; Inject 0.5  mLs into the muscle once for 1 dose.  Left lumbar radiculitis -     HYDROcodone-acetaminophen (NORCO) 10-325 MG tablet; Take 1 tablet by mouth every 8 (eight) hours.  Primary osteoarthritis involving multiple joints -     HYDROcodone-acetaminophen (NORCO) 10-325 MG tablet; Take 1 tablet by mouth every 8 (eight) hours.  Other orders -     Pneumococcal polysaccharide vaccine 23-valent greater than or equal to 2yo subcutaneous/IM   I am having Mark Cohen "Mark Cohen" start on Shingrix. I am also having him maintain his cholecalciferol, ALPRAZolam, esomeprazole, and HYDROcodone-acetaminophen.  Meds ordered this encounter  Medications   esomeprazole (  NEXIUM) 40 MG capsule    Sig: Take 1 capsule (40 mg total) by mouth daily.    Dispense:  90 capsule    Refill:  1   Zoster Vaccine Adjuvanted [facility]) injection    Sig: Inject 0.5 mLs into the muscle once for 1 dose.    Dispense:  0.5 mL    Refill:  1   HYDROcodone-acetaminophen (NORCO) 10-325 MG tablet    Sig: Take 1 tablet by mouth every 8 (eight) hours.    Dispense:  90 tablet    Refill:  0     Follow-up: Return in about 3 months (around 07/07/2022).  Scarlette Calico, MD

## 2022-04-07 NOTE — Patient Instructions (Signed)
Managing Chronic Back Pain Chronic back pain is back pain that lasts for 12 weeks or longer. It often affects the lower back. Back pain may feel like a muscle ache or a sharp, stabbing pain. It can be mild, moderate, or severe. If you have been diagnosed with chronic back pain, there are things you can do to manage your symptoms. You may have to try different things to see what works best for you. Your health care provider may also give you specific instructions. How to manage lifestyle changes Treating chronic back pain often starts with rest and pain relief, followed by exercises to restore movement and strength to your back (physical therapy). You may need surgery if other treatments do not help, or if your pain is caused by a condition or an injury. Follow your treatment plan as told by your health care provider. This may include: Relaxation techniques. Talk therapy or counseling with a mental health specialist. A form of talk therapy called cognitive behavioral therapy (CBT) can be especially helpful. This therapy helps you set goals and follow up on the changes that you make. Acupuncture or massage therapy. Local electrical stimulation. Injections. These deliver numbing or pain-relieving medicines into your spine or the area of pain. How to recognize changes in your chronic back pain Your condition may improve with treatment. However, back pain may not go away or may get worse over time. Watch your symptoms carefully and let your health care provider know if your symptoms get worse or do not improve. Your back pain may be getting worse if you have: Pain that begins to cause problems with posture. Pain that gets worse when you are sitting, standing, walking, bending, or lifting. Pain that affects you while you are active, or at rest, or both. Pain that eventually makes it hard to move around (limits mobility). Pain that occurs with fever, weight loss, or difficulty urinating. Pain that causes  numbness and tingling. How to use body mechanics and posture to help with pain Healthy body mechanics and good posture can help to relieve stress on your back. Body mechanics refers to the movements and positions of your body during your daily activities. Posture is part of body mechanics. Good posture means: Your spine is in its natural S-curve, or neutral, position. Your shoulders are pulled back slightly. Your head is not tipped forward. Follow these guidelines to improve your posture and body mechanics in your everyday activities. Standing  When standing, keep your spine neutral and your feet about hip-width apart. Keep your knees slightly bent. Your ears, shoulders, and hips should line up. When you do a task in which you stand in one place for a long time, place one foot on a stable object that is 2-4 inches (5-10 cm) high, such as a footstool. This helps keep your spine neutral. Sitting  When sitting, keep your spine neutral and your feet flat on the floor. Use a footrest, if necessary, and keep your thighs parallel to the floor. Avoid rounding your shoulders, and avoid tilting your head forward. When working at a desk or a computer, keep your desk at a height where your hands are slightly lower than your elbows. Slide your chair under your desk so you are close enough to maintain good posture. When working at a computer, place your monitor at a height where you are looking straight ahead and you do not have to tilt your head forward or downward to view the screen. Lifting  Keep your feet at   least shoulder-width apart and tighten the muscles of your abdomen. Bend your knees and hips and keep your spine neutral. Be sure to lift using the strength of your legs, not your back. Do not lock your knees straight out. Always ask for help to lift heavy or awkward objects. Resting  When lying down and resting, avoid positions that are most painful. If you have pain with activities such as  sitting, bending, stooping, or squatting, lie in a position in which your body does not bend very much. For example, avoid curling up on your side with your arms and knees near your chest (fetal position). If you have pain with activities such as standing for a long time or reaching with your arms, lie with your spine in a neutral position and bend your knees slightly. Try: Lying on your side with a pillow between your knees. Lying on your back with a pillow under your knees. Follow these instructions at home: Medicines Treatment may include over-the-counter or prescription medicines for pain and inflammation that are taken by mouth or applied to the skin. Another treatment may include muscle relaxants. Take over-the-counter and prescription medicines only as told by your health care provider. Ask your health care provider if the medicine prescribed to you: Requires you to avoid driving or using machinery. Can cause constipation. You may need to take these actions to prevent or treat constipation: Drink enough fluid to keep your urine pale yellow. Take over-the-counter or prescription medicines. Eat foods that are high in fiber, such as beans, whole grains, and fresh fruits and vegetables. Limit foods that are high in fat and processed sugars, such as fried or sweet foods. Lifestyle Do not use any products that contain nicotine or tobacco, such as cigarettes, e-cigarettes, and chewing tobacco. If you need help quitting, ask your health care provider. Eat a healthy diet that includes foods such as vegetables, fruits, fish, and lean meats. Work with your health care provider to achieve or maintain a healthy weight. General instructions Get regular exercise as told. Exercise improves flexibility and strength. If physical therapy was prescribed, do exercises as told by your health care provider. Use ice or heat therapy as told by your health care provider. Keep all follow-up visits as told by your  health care provider. This is important. Where can I get support? Consider joining a support group for people managing chronic back pain. Ask your health care provider about support groups in your area. You can also find online and in-person support groups through: The American Chronic Pain Association: theacpa.org Pain Connection Program: painconnection.org Contact a health care provider if: You have pain that is not relieved with rest or medicine. Your pain gets worse, or you have new pain. You have a fever. You have rapid weight loss. You have trouble doing your normal activities. Get help right away if: You have weakness or numbness in one or both of your legs or feet. You have trouble controlling your bladder or your bowels. You have severe back pain and have any of the following: Nausea or vomiting. Abdominal pain. Shortness of breath or you faint. Summary Chronic back pain is often treated with rest, pain relief, and physical therapy. Talk therapy, acupuncture, massage, and local electrical stimulation may help. Follow your treatment plan as told by your health care provider. Joining a support group may help you manage chronic back pain. This information is not intended to replace advice given to you by your health care provider. Make sure you   discuss any questions you have with your health care provider. Document Revised: 08/08/2019 Document Reviewed: 04/16/2019 Elsevier Patient Education  2023 [employer] Inc.  

## 2022-04-25 NOTE — Telephone Encounter (Signed)
error 

## 2022-06-05 IMAGING — DX DG ABDOMEN ACUTE W/ 1V CHEST
4 series · 4 of 4 positions shown · non-contrast
Comparison: Chest radiographs 02/09/2017

CLINICAL DATA: LEFT flank pain for 3 months, no known injury,
former smoker

EXAM:
DG ABDOMEN ACUTE WITH 1 VIEW CHEST

[chest pa (1 of 2)]
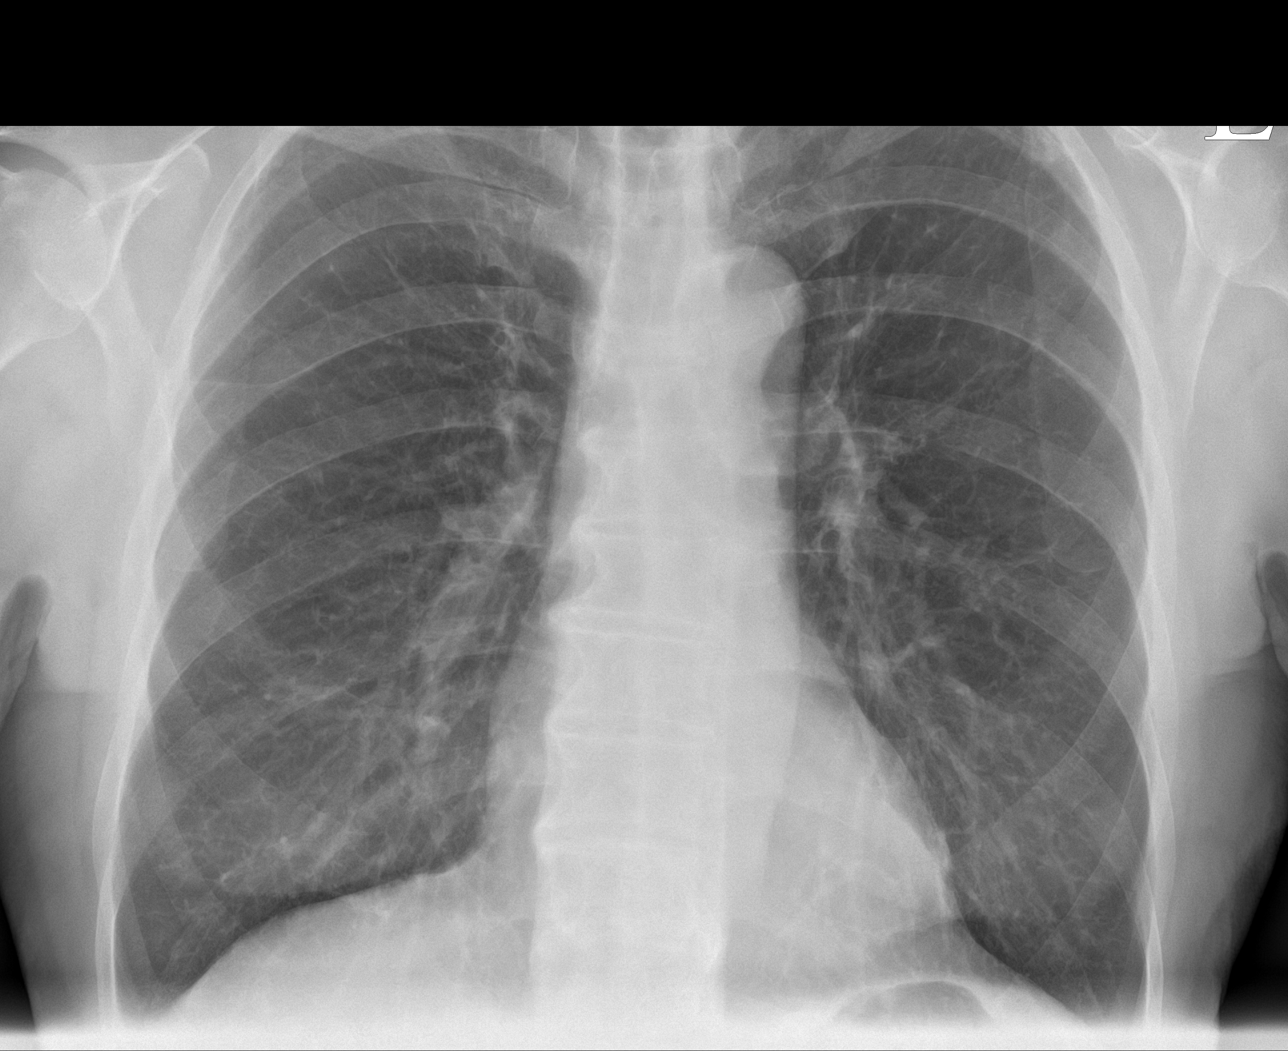

[chest pa (2 of 2)]
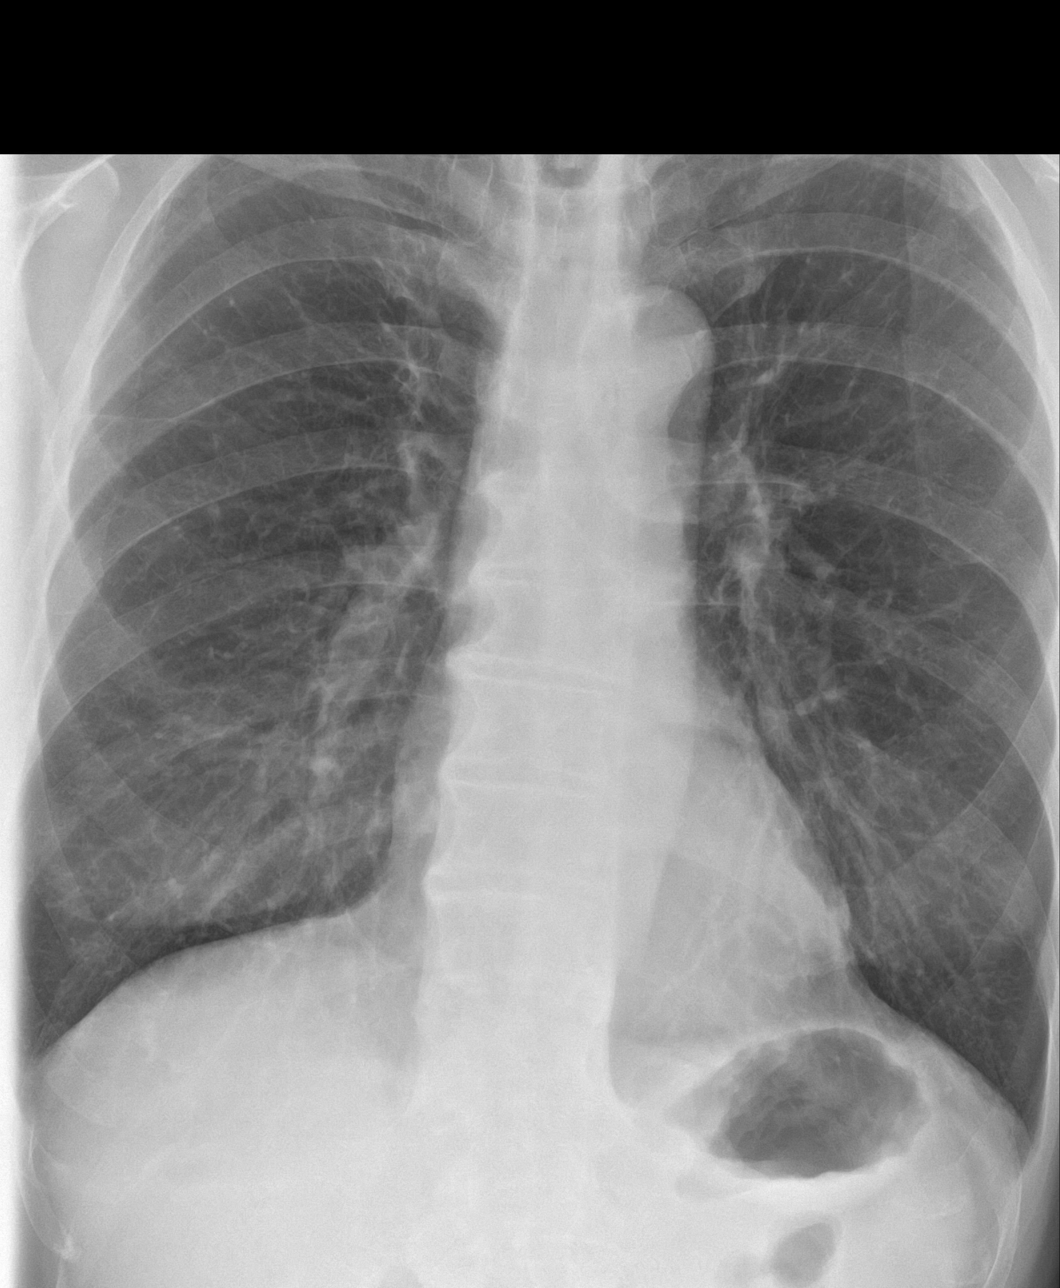

[abdomen erect]
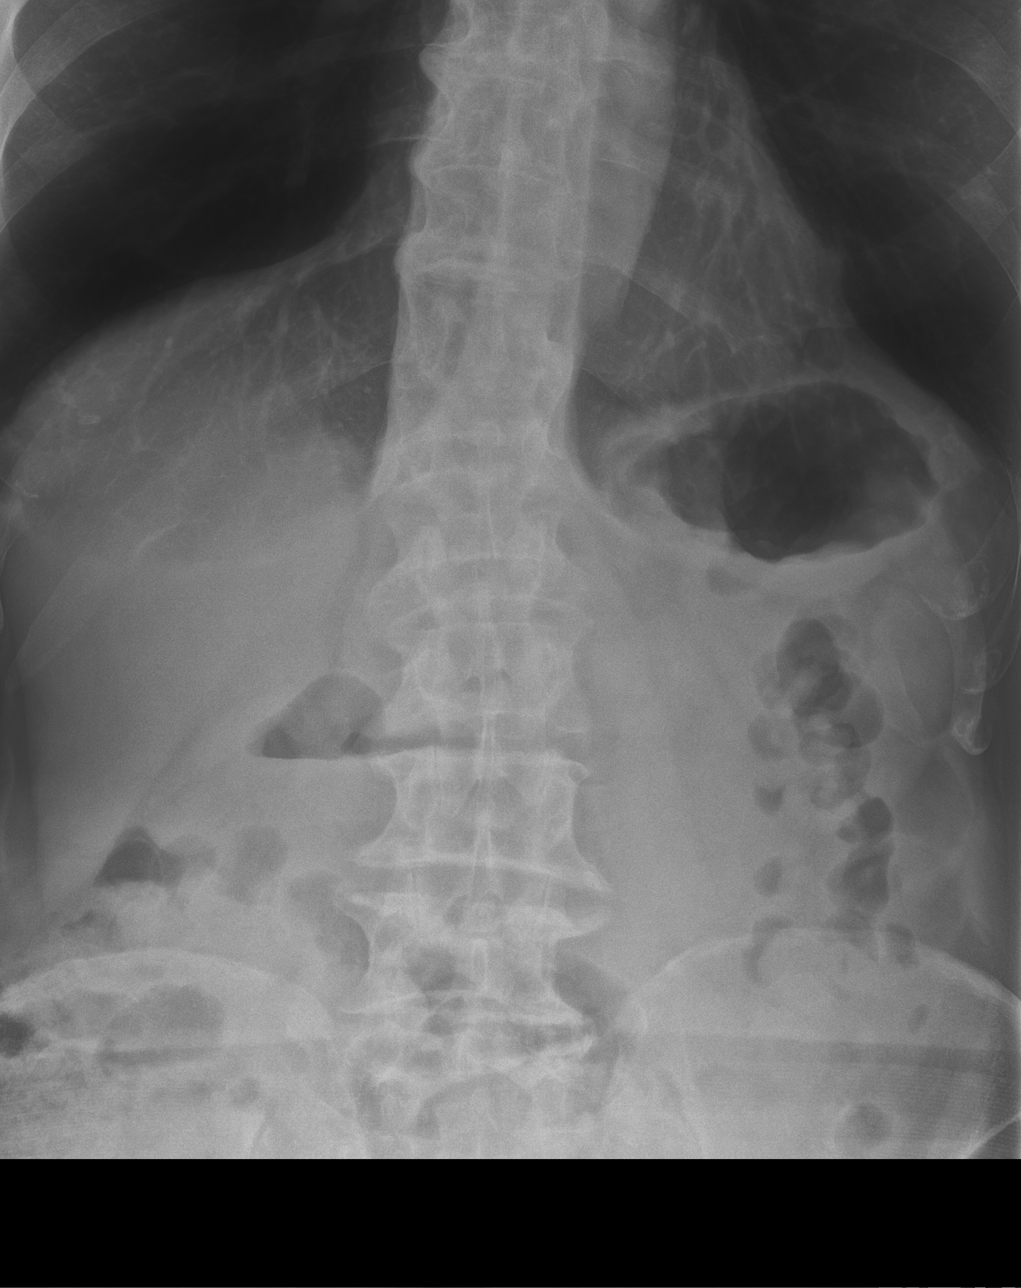

[abdomen supine]
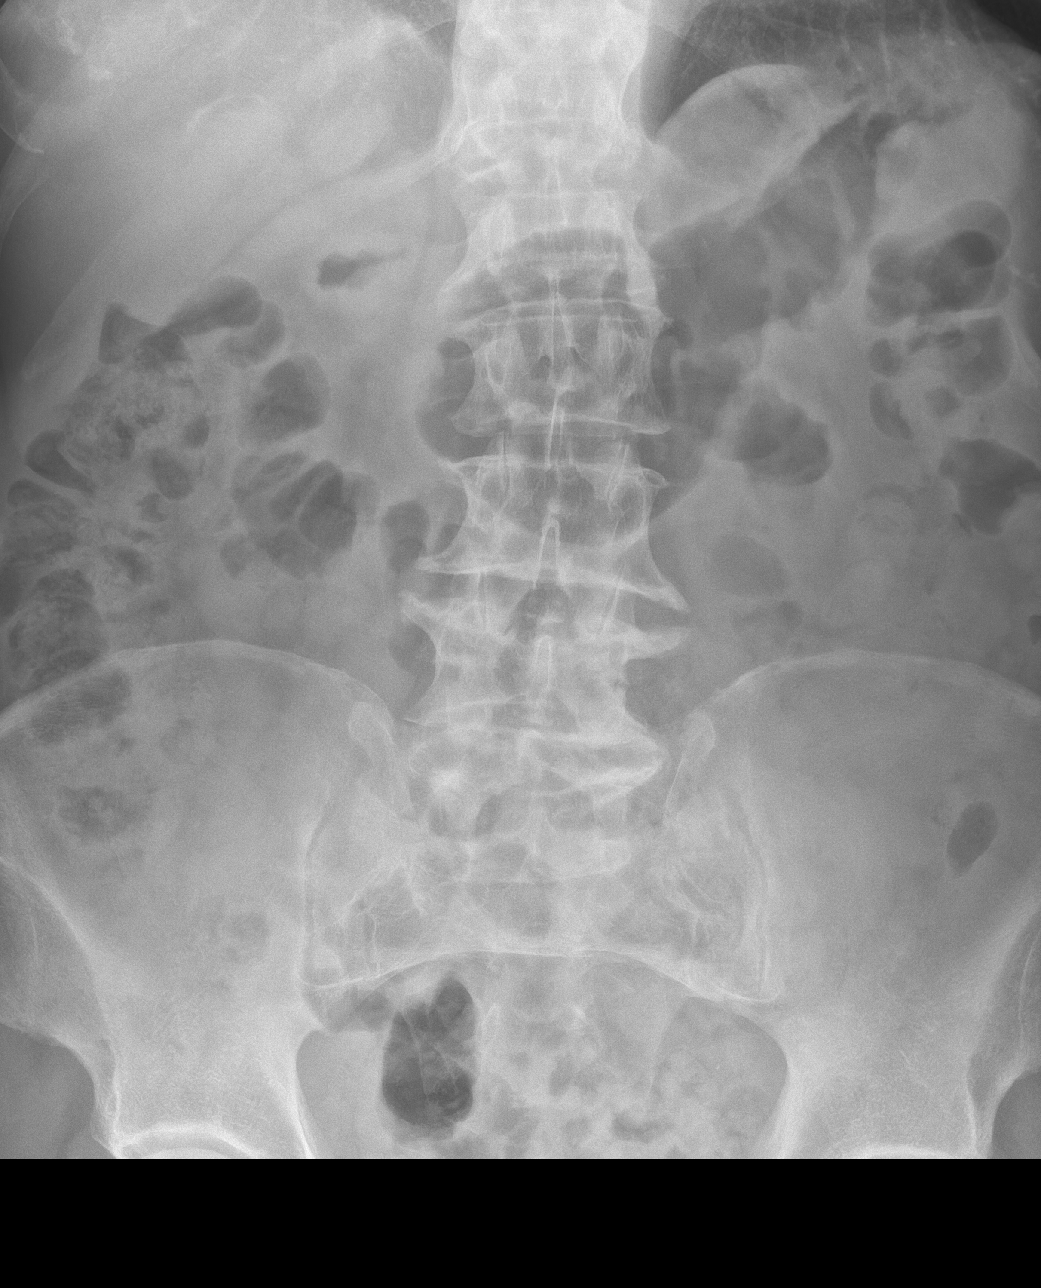

[4 of 4 positions shown; findings below may reference images not displayed]

FINDINGS: Normal heart size, mediastinal contours, and pulmonary vascularity.

Emphysematous and minimal bronchitic changes consistent with COPD.

Biapical scarring.

No acute infiltrate, pleural effusion, or pneumothorax.

Normal bowel gas pattern.

No bowel dilatation, bowel wall thickening, or free air.

Multiple pelvic phleboliths.

No definite urinary tract calcification.

Degenerative disc disease changes of thoracolumbar spine with
biconvex scoliosis.
IMPRESSION: COPD changes with biapical scarring.

No acute abdominal findings.

## 2022-07-19 DIAGNOSIS — H524 Presbyopia: Secondary | ICD-10-CM | POA: Diagnosis not present

## 2022-07-19 DIAGNOSIS — H2512 Age-related nuclear cataract, left eye: Secondary | ICD-10-CM | POA: Diagnosis not present

## 2022-07-30 ENCOUNTER — Other Ambulatory Visit: Payer: Self-pay | Admitting: Internal Medicine

## 2022-07-30 DIAGNOSIS — F411 Generalized anxiety disorder: Secondary | ICD-10-CM

## 2022-08-12 ENCOUNTER — Telehealth: Payer: Medicare HMO | Admitting: Family Medicine

## 2022-08-12 DIAGNOSIS — J019 Acute sinusitis, unspecified: Secondary | ICD-10-CM

## 2022-08-12 DIAGNOSIS — B9689 Other specified bacterial agents as the cause of diseases classified elsewhere: Secondary | ICD-10-CM

## 2022-08-12 DIAGNOSIS — Z9889 Other specified postprocedural states: Secondary | ICD-10-CM | POA: Diagnosis not present

## 2022-08-12 MED ORDER — AMOXICILLIN-POT CLAVULANATE 875-125 MG PO TABS: 1.0000 | ORAL_TABLET | Freq: Two times a day (BID) | ORAL | 0 refills | Status: AC

## 2022-08-12 NOTE — Progress Notes (Signed)

## 2022-09-05 ENCOUNTER — Ambulatory Visit (INDEPENDENT_AMBULATORY_CARE_PROVIDER_SITE_OTHER): Payer: Medicare HMO | Admitting: Internal Medicine

## 2022-09-05 ENCOUNTER — Encounter: Payer: Self-pay | Admitting: Internal Medicine

## 2022-09-05 VITALS — BP 130/78 | HR 70 | Temp 98.1°F | Resp 16 | Ht 72.0 in | Wt 171.0 lb

## 2022-09-05 DIAGNOSIS — M25552 Pain in left hip: Secondary | ICD-10-CM

## 2022-09-05 DIAGNOSIS — M545 Low back pain, unspecified: Secondary | ICD-10-CM | POA: Diagnosis not present

## 2022-09-05 DIAGNOSIS — I10 Essential (primary) hypertension: Secondary | ICD-10-CM | POA: Diagnosis not present

## 2022-09-05 DIAGNOSIS — M159 Polyosteoarthritis, unspecified: Secondary | ICD-10-CM | POA: Diagnosis not present

## 2022-09-05 DIAGNOSIS — G8929 Other chronic pain: Secondary | ICD-10-CM

## 2022-09-05 DIAGNOSIS — M5416 Radiculopathy, lumbar region: Secondary | ICD-10-CM | POA: Diagnosis not present

## 2022-09-05 DIAGNOSIS — M25551 Pain in right hip: Secondary | ICD-10-CM | POA: Diagnosis not present

## 2022-09-05 DIAGNOSIS — E785 Hyperlipidemia, unspecified: Secondary | ICD-10-CM | POA: Diagnosis not present

## 2022-09-05 LAB — CBC WITH DIFFERENTIAL/PLATELET
Basophils Absolute: 0 10*3/uL (ref 0.0–0.1)
Basophils Relative: 0.6 % (ref 0.0–3.0)
Eosinophils Absolute: 0.2 10*3/uL (ref 0.0–0.7)
Eosinophils Relative: 3.8 % (ref 0.0–5.0)
HCT: 41.7 % (ref 39.0–52.0)
Hemoglobin: 14.4 g/dL (ref 13.0–17.0)
Lymphocytes Relative: 25.8 % (ref 12.0–46.0)
Lymphs Abs: 1.7 10*3/uL (ref 0.7–4.0)
MCHC: 34.5 g/dL (ref 30.0–36.0)
MCV: 89.8 fl (ref 78.0–100.0)
Monocytes Absolute: 0.4 10*3/uL (ref 0.1–1.0)
Monocytes Relative: 6.7 % (ref 3.0–12.0)
Neutro Abs: 4.1 10*3/uL (ref 1.4–7.7)
Neutrophils Relative %: 63.1 % (ref 43.0–77.0)
Platelets: 231 10*3/uL (ref 150.0–400.0)
RBC: 4.64 Mil/uL (ref 4.22–5.81)
RDW: 13.3 % (ref 11.5–15.5)
WBC: 6.5 10*3/uL (ref 4.0–10.5)

## 2022-09-05 MED ORDER — HYDROCODONE-ACETAMINOPHEN 10-325 MG PO TABS: 1.0000 | ORAL_TABLET | Freq: Three times a day (TID) | ORAL | 0 refills | Status: DC

## 2022-09-05 NOTE — Progress Notes (Signed)
Subjective:  Patient ID: Mark Cohen, male    DOB: [birth year]  Age: 72 y.o. MRN: [id number]  CC: Hypertension, Back Pain, and Osteoarthritis   HPI Mark Cohen presents for f/up ----  He is very active and denies chest pain, shortness of breath, diaphoresis, or edema.  He continues to complain of sharp nonradiating low back pain with no lower extremity paresthesias.  Outpatient Medications Prior to Visit  Medication Sig Dispense Refill   ALPRAZolam (XANAX) 0.25 MG tablet TAKE ONE TABLET BY MOUTH TWICE A DAY AS NEEDED 35 tablet 3   cholecalciferol (VITAMIN D) 1000 UNITS tablet Take 5,000 Units by mouth daily.     esomeprazole (NEXIUM) 40 MG capsule Take 1 capsule (40 mg total) by mouth daily. 90 capsule 1   HYDROcodone-acetaminophen (NORCO) 10-325 MG tablet Take 1 tablet by mouth every 8 (eight) hours. 90 tablet 0   No facility-administered medications prior to visit.    ROS Review of Systems  Constitutional:  Negative for chills, diaphoresis and fatigue.  HENT: Negative.    Eyes: Negative.   Respiratory:  Negative for cough, chest tightness, shortness of breath and wheezing.   Cardiovascular:  Negative for chest pain, palpitations and leg swelling.  Gastrointestinal:  Negative for abdominal pain, constipation, diarrhea, nausea and vomiting.  Endocrine: Negative.   Genitourinary: Negative.  Negative for difficulty urinating.  Musculoskeletal:  Positive for arthralgias and back pain. Negative for joint swelling and myalgias.  Skin: Negative.   Neurological: Negative.  Negative for dizziness, weakness, light-headedness and numbness.  Hematological:  Negative for adenopathy. Does not bruise/bleed easily.  Psychiatric/Behavioral: Negative.      Objective:  BP (!) 144/82 (BP Location: Left Arm, Patient Position: Sitting, Cuff Size: Normal)   Pulse 70   Temp 98.1 F (36.7 C) (Oral)   Resp 16   Ht 6' (1.829 m)   Wt 171 lb (77.6 kg)   SpO2 98%   BMI 23.19 kg/m    BP Readings from Last 3 Encounters:  09/05/22 (!) 144/82  04/07/22 132/80  10/12/21 134/82    Wt Readings from Last 3 Encounters:  09/05/22 171 lb (77.6 kg)  04/07/22 170 lb (77.1 kg)  10/12/21 169 lb (76.7 kg)    Physical Exam Vitals reviewed.  HENT:     Nose: Nose normal.     Mouth/Throat:     Mouth: Mucous membranes are moist.  Eyes:     General: No scleral icterus.    Conjunctiva/sclera: Conjunctivae normal.  Cardiovascular:     Rate and Rhythm: Normal rate and regular rhythm.     Heart sounds: No murmur heard. Pulmonary:     Effort: Pulmonary effort is normal.     Breath sounds: No stridor. No wheezing, rhonchi or rales.  Abdominal:     General: Abdomen is flat.     Palpations: There is no mass.     Tenderness: There is no abdominal tenderness. There is no guarding.     Hernia: No hernia is present.  Musculoskeletal:        General: Normal range of motion.     Cervical back: Neck supple.     Right lower leg: No edema.     Left lower leg: No edema.  Lymphadenopathy:     Cervical: No cervical adenopathy.  Skin:    General: Skin is warm and dry.  Neurological:     General: No focal deficit present.     Mental Status: He is alert. Mental status is  at baseline.  Psychiatric:        Mood and Affect: Mood normal.        Behavior: Behavior normal.     Lab Results  Component Value Date   WBC 6.5 09/05/2022   HGB 14.4 09/05/2022   HCT 41.7 09/05/2022   PLT 231.0 09/05/2022   GLUCOSE 85 09/05/2022   CHOL 186 10/12/2021   TRIG 103.0 10/12/2021   HDL 95.30 10/12/2021   LDLCALC 70 10/12/2021   ALT 17 10/12/2021   AST 26 10/12/2021   NA 139 09/05/2022   K 4.5 09/05/2022   CL 103 09/05/2022   CREATININE 0.88 09/05/2022   BUN 13 09/05/2022   CO2 31 09/05/2022   TSH 1.82 10/12/2021   PSA 0.10 10/12/2021   HGBA1C 5.5 09/14/2017    No results found.  Assessment & Plan:   Mark Cohen was seen today for hypertension, back pain and  osteoarthritis.  Diagnoses and all orders for this visit:  Essential hypertension, benign- His blood pressure is adequately well-controlled.  He will continue working on his lifestyle modifications. -     CBC with Differential/Platelet; Future -     Basic metabolic panel; Future -     Urinalysis, Routine w reflex microscopic; Future -     Urinalysis, Routine w reflex microscopic -     Basic metabolic panel -     CBC with Differential/Platelet  Left lumbar radiculitis -     HYDROcodone-acetaminophen (NORCO) 10-325 MG tablet; Take 1 tablet by mouth every 8 (eight) hours.  Primary osteoarthritis involving multiple joints -     HYDROcodone-acetaminophen (NORCO) 10-325 MG tablet; Take 1 tablet by mouth every 8 (eight) hours.  Chronic bilateral low back pain without sciatica -     HYDROcodone-acetaminophen (NORCO) 10-325 MG tablet; Take 1 tablet by mouth every 8 (eight) hours.  Chronic pain of both hips -     HYDROcodone-acetaminophen (NORCO) 10-325 MG tablet; Take 1 tablet by mouth every 8 (eight) hours.  Hyperlipidemia with target LDL less than 130- I have asked him to take a statin for CV risk reduction. -     rosuvastatin (CRESTOR) 5 MG tablet; Take 1 tablet (5 mg total) by mouth daily.   I am having Mark Cohen "Mark Cohen" start on rosuvastatin. I am also having him maintain his cholecalciferol, esomeprazole, ALPRAZolam, and HYDROcodone-acetaminophen.  Meds ordered this encounter  Medications   HYDROcodone-acetaminophen (NORCO) 10-325 MG tablet    Sig: Take 1 tablet by mouth every 8 (eight) hours.    Dispense:  90 tablet    Refill:  0   rosuvastatin (CRESTOR) 5 MG tablet    Sig: Take 1 tablet (5 mg total) by mouth daily.    Dispense:  90 tablet    Refill:  1     Follow-up: Return in about 3 months (around 12/04/2022).  Scarlette Calico, MD

## 2022-09-05 NOTE — Patient Instructions (Signed)
Hypertension, Adult High blood pressure (hypertension) is when the force of blood pumping through the arteries is too strong. The arteries are the blood vessels that carry blood from the heart throughout the body. Hypertension forces the heart to work harder to pump blood and may cause arteries to become narrow or stiff. Untreated or uncontrolled hypertension can lead to a heart attack, heart failure, a stroke, kidney disease, and other problems. A blood pressure reading consists of a higher number over a lower number. Ideally, your blood pressure should be below 120/80. The first ("top") number is called the systolic pressure. It is a measure of the pressure in your arteries as your heart beats. The second ("bottom") number is called the diastolic pressure. It is a measure of the pressure in your arteries as the heart relaxes. What are the causes? The exact cause of this condition is not known. There are some conditions that result in high blood pressure. What increases the risk? Certain factors may make you more likely to develop high blood pressure. Some of these risk factors are under your control, including: Smoking. Not getting enough exercise or physical activity. Being overweight. Having too much fat, sugar, calories, or salt (sodium) in your diet. Drinking too much alcohol. Other risk factors include: Having a personal history of heart disease, diabetes, high cholesterol, or kidney disease. Stress. Having a family history of high blood pressure and high cholesterol. Having obstructive sleep apnea. Age. The risk increases with age. What are the signs or symptoms? High blood pressure may not cause symptoms. Very high blood pressure (hypertensive crisis) may cause: Headache. Fast or irregular heartbeats (palpitations). Shortness of breath. Nosebleed. Nausea and vomiting. Vision changes. Severe chest pain, dizziness, and seizures. How is this diagnosed? This condition is diagnosed by  measuring your blood pressure while you are seated, with your arm resting on a flat surface, your legs uncrossed, and your feet flat on the floor. The cuff of the blood pressure monitor will be placed directly against the skin of your upper arm at the level of your heart. Blood pressure should be measured at least twice using the same arm. Certain conditions can cause a difference in blood pressure between your right and left arms. If you have a high blood pressure reading during one visit or you have normal blood pressure with other risk factors, you may be asked to: Return on a different day to have your blood pressure checked again. Monitor your blood pressure at home for 1 week or longer. If you are diagnosed with hypertension, you may have other blood or imaging tests to help your health care provider understand your overall risk for other conditions. How is this treated? This condition is treated by making healthy lifestyle changes, such as eating healthy foods, exercising more, and reducing your alcohol intake. You may be referred for counseling on a healthy diet and physical activity. Your health care provider may prescribe medicine if lifestyle changes are not enough to get your blood pressure under control and if: Your systolic blood pressure is above 130. Your diastolic blood pressure is above 80. Your personal target blood pressure may vary depending on your medical conditions, your age, and other factors. Follow these instructions at home: Eating and drinking  Eat a diet that is high in fiber and potassium, and low in sodium, added sugar, and fat. An example of this eating plan is called the DASH diet. DASH stands for Dietary Approaches to Stop Hypertension. To eat this way: Eat   plenty of fresh fruits and vegetables. Try to fill one half of your plate at each meal with fruits and vegetables. Eat whole grains, such as whole-wheat pasta, brown rice, or whole-grain bread. Fill about one  fourth of your plate with whole grains. Eat or drink low-fat dairy products, such as skim milk or low-fat yogurt. Avoid fatty cuts of meat, processed or cured meats, and poultry with skin. Fill about one fourth of your plate with lean proteins, such as fish, chicken without skin, beans, eggs, or tofu. Avoid pre-made and processed foods. These tend to be higher in sodium, added sugar, and fat. Reduce your daily sodium intake. Many people with hypertension should eat less than 1,500 mg of sodium a day. Do not drink alcohol if: Your health care provider tells you not to drink. You are pregnant, may be pregnant, or are planning to become pregnant. If you drink alcohol: Limit how much you have to: 0-1 drink a day for women. 0-2 drinks a day for men. Know how much alcohol is in your drink. In the [place] one drink equals one 12 oz bottle of beer (355 mL), one 5 oz glass of wine (148 mL), or one 1 oz glass of hard liquor (44 mL). Lifestyle  Work with your health care provider to maintain a healthy body weight or to lose weight. Ask what an ideal weight is for you. Get at least 30 minutes of exercise that causes your heart to beat faster (aerobic exercise) most days of the week. Activities may include walking, swimming, or biking. Include exercise to strengthen your muscles (resistance exercise), such as Pilates or lifting weights, as part of your weekly exercise routine. Try to do these types of exercises for 30 minutes at least 3 days a week. Do not use any products that contain nicotine or tobacco. These products include cigarettes, chewing tobacco, and vaping devices, such as e-cigarettes. If you need help quitting, ask your health care provider. Monitor your blood pressure at home as told by your health care provider. Keep all follow-up visits. This is important. Medicines Take over-the-counter and prescription medicines only as told by your health care provider. Follow directions carefully. Blood  pressure medicines must be taken as prescribed. Do not skip doses of blood pressure medicine. Doing this puts you at risk for problems and can make the medicine less effective. Ask your health care provider about side effects or reactions to medicines that you should watch for. Contact a health care provider if you: Think you are having a reaction to a medicine you are taking. Have headaches that keep coming back (recurring). Feel dizzy. Have swelling in your ankles. Have trouble with your vision. Get help right away if you: Develop a severe headache or confusion. Have unusual weakness or numbness. Feel faint. Have severe pain in your chest or abdomen. Vomit repeatedly. Have trouble breathing. These symptoms may be an emergency. Get help right away. Call 911. Do not wait to see if the symptoms will go away. Do not drive yourself to the hospital. Summary Hypertension is when the force of blood pumping through your arteries is too strong. If this condition is not controlled, it may put you at risk for serious complications. Your personal target blood pressure may vary depending on your medical conditions, your age, and other factors. For most people, a normal blood pressure is less than 120/80. Hypertension is treated with lifestyle changes, medicines, or a combination of both. Lifestyle changes include losing weight, eating a healthy,   low-sodium diet, exercising more, and limiting alcohol. This information is not intended to replace advice given to you by your health care provider. Make sure you discuss any questions you have with your health care provider. Document Revised: 05/04/2021 Document Reviewed: 05/04/2021 Elsevier Patient Education  2023 [employer].  

## 2022-09-06 LAB — BASIC METABOLIC PANEL
BUN: 13 mg/dL (ref 6–23)
CO2: 31 mEq/L (ref 19–32)
Calcium: 9.9 mg/dL (ref 8.4–10.5)
Chloride: 103 mEq/L (ref 96–112)
Creatinine, Ser: 0.88 mg/dL (ref 0.40–1.50)
GFR: 86.12 mL/min (ref >60.00)
Glucose, Bld: 85 mg/dL (ref 70–99)
Potassium: 4.5 mEq/L (ref 3.5–5.1)
Sodium: 139 mEq/L (ref 135–145)

## 2022-09-06 LAB — URINALYSIS, ROUTINE W REFLEX MICROSCOPIC
Bilirubin Urine: NEGATIVE
Hgb urine dipstick: NEGATIVE
Ketones, ur: NEGATIVE
Leukocytes,Ua: NEGATIVE
Nitrite: NEGATIVE
Specific Gravity, Urine: 1.02 (ref 1.000–1.030)
Total Protein, Urine: NEGATIVE
Urine Glucose: NEGATIVE
Urobilinogen, UA: 0.2 (ref 0.0–1.0)
pH: 6.5 (ref 5.0–8.0)

## 2022-09-09 ENCOUNTER — Encounter: Payer: Self-pay | Admitting: Internal Medicine

## 2022-09-09 MED ORDER — ROSUVASTATIN CALCIUM 5 MG PO TABS: 5.0000 mg | ORAL_TABLET | Freq: Every day | ORAL | 1 refills | Status: DC

## 2022-09-20 DIAGNOSIS — Z0100 Encounter for examination of eyes and vision without abnormal findings: Secondary | ICD-10-CM | POA: Diagnosis not present

## 2022-10-01 ENCOUNTER — Other Ambulatory Visit: Payer: Self-pay | Admitting: Internal Medicine

## 2022-10-01 DIAGNOSIS — K219 Gastro-esophageal reflux disease without esophagitis: Secondary | ICD-10-CM

## 2022-12-19 DIAGNOSIS — H00011 Hordeolum externum right upper eyelid: Secondary | ICD-10-CM | POA: Diagnosis not present

## 2022-12-19 DIAGNOSIS — H01001 Unspecified blepharitis right upper eyelid: Secondary | ICD-10-CM | POA: Diagnosis not present

## 2022-12-19 DIAGNOSIS — H10501 Unspecified blepharoconjunctivitis, right eye: Secondary | ICD-10-CM | POA: Diagnosis not present

## 2022-12-21 DIAGNOSIS — H10501 Unspecified blepharoconjunctivitis, right eye: Secondary | ICD-10-CM | POA: Diagnosis not present

## 2022-12-21 DIAGNOSIS — H43812 Vitreous degeneration, left eye: Secondary | ICD-10-CM | POA: Diagnosis not present

## 2023-01-19 DIAGNOSIS — H43812 Vitreous degeneration, left eye: Secondary | ICD-10-CM | POA: Diagnosis not present

## 2023-02-01 ENCOUNTER — Ambulatory Visit (INDEPENDENT_AMBULATORY_CARE_PROVIDER_SITE_OTHER): Payer: Medicare HMO

## 2023-02-01 VITALS — BP 122/70 | HR 85 | Temp 98.3°F | Ht 72.0 in | Wt 163.2 lb

## 2023-02-01 DIAGNOSIS — Z0000 Encounter for general adult medical examination without abnormal findings: Secondary | ICD-10-CM | POA: Diagnosis not present

## 2023-02-01 NOTE — Patient Instructions (Addendum)
Mark Cohen , Thank you for taking time to come for your Medicare Wellness Visit. I appreciate your ongoing commitment to your health goals. Please review the following plan we discussed and let me know if I can assist you in the future.   These are the goals we discussed:  Goals      Client understands the importance of follow-up with providers by attending scheduled visits        This is a list of the screening recommended for you and due dates:  Health Maintenance  Topic Date Due   Zoster (Shingles) Vaccine (1 of 2) 07/08/1969   Flu Shot  02/09/2023   Medicare Annual Wellness Visit  02/01/2024   Colon Cancer Screening  01/13/2029   DTaP/Tdap/Td vaccine (4 - Td or Tdap) 05/26/2029   Pneumonia Vaccine  Completed   Hepatitis C Screening  Completed   HPV Vaccine  Aged Out   COVID-19 Vaccine  Discontinued    Advanced directives: Yes  Conditions/risks identified: Yes  Next appointment: It was nice speaking with you today!  Please follow up in one year for your annual wellness visit in the office with Nurse Girolamo Lortie on 02/05/2024 at 3:00 p.m.  If you need to cancel or reschedule please call [phone].  Preventive Care 73 Years and Older, Male  Preventive care refers to lifestyle choices and visits with your health care provider that can promote health and wellness. What does preventive care include? A yearly physical exam. This is also called an annual well check. Dental exams once or twice a year. Routine eye exams. Ask your health care provider how often you should have your eyes checked. Personal lifestyle choices, including: Daily care of your teeth and gums. Regular physical activity. Eating a healthy diet. Avoiding tobacco and drug use. Limiting alcohol use. Practicing safe sex. Taking low doses of aspirin every day. Taking vitamin and mineral supplements as recommended by your health care provider. What happens during an annual well check? The services and  screenings done by your health care provider during your annual well check will depend on your age, overall health, lifestyle risk factors, and family history of disease. Counseling  Your health care provider may ask you questions about your: Alcohol use. Tobacco use. Drug use. Emotional well-being. Home and relationship well-being. Sexual activity. Eating habits. History of falls. Memory and ability to understand (cognition). Work and work Astronomer. Screening  You may have the following tests or measurements: Height, weight, and BMI. Blood pressure. Lipid and cholesterol levels. These may be checked every 5 years, or more frequently if you are over 20 years old. Skin check. Lung cancer screening. You may have this screening every year starting at age 49 if you have a 30-pack-year history of smoking and currently smoke or have quit within the past 15 years. Fecal occult blood test (FOBT) of the stool. You may have this test every year starting at age 30. Flexible sigmoidoscopy or colonoscopy. You may have a sigmoidoscopy every 5 years or a colonoscopy every 10 years starting at age 61. Prostate cancer screening. Recommendations will vary depending on your family history and other risks. Hepatitis C blood test. Hepatitis B blood test. Sexually transmitted disease (STD) testing. Diabetes screening. This is done by checking your blood sugar (glucose) after you have not eaten for a while (fasting). You may have this done every 1-3 years. Abdominal aortic aneurysm (AAA) screening. You may need this if you are a current or former smoker. Osteoporosis. You  may be screened starting at age 47 if you are at high risk. Talk with your health care provider about your test results, treatment options, and if necessary, the need for more tests. Vaccines  Your health care provider may recommend certain vaccines, such as: Influenza vaccine. This is recommended every year. Tetanus, diphtheria, and  acellular pertussis (Tdap, Td) vaccine. You may need a Td booster every 10 years. Zoster vaccine. You may need this after age 39. Pneumococcal 13-valent conjugate (PCV13) vaccine. One dose is recommended after age 71. Pneumococcal polysaccharide (PPSV23) vaccine. One dose is recommended after age 2. Talk to your health care provider about which screenings and vaccines you need and how often you need them. This information is not intended to replace advice given to you by your health care provider. Make sure you discuss any questions you have with your health care provider. Document Released: 07/24/2015 Document Revised: 03/16/2016 Document Reviewed: 04/28/2015 Elsevier Interactive Patient Education  2017 [employer].  Fall Prevention in the Home Falls can cause injuries. They can happen to people of all ages. There are many things you can do to make your home safe and to help prevent falls. What can I do on the outside of my home? Regularly fix the edges of walkways and driveways and fix any cracks. Remove anything that might make you trip as you walk through a door, such as a raised step or threshold. Trim any bushes or trees on the path to your home. Use bright outdoor lighting. Clear any walking paths of anything that might make someone trip, such as rocks or tools. Regularly check to see if handrails are loose or broken. Make sure that both sides of any steps have handrails. Any raised decks and porches should have guardrails on the edges. Have any leaves, snow, or ice cleared regularly. Use sand or salt on walking paths during winter. Clean up any spills in your garage right away. This includes oil or grease spills. What can I do in the bathroom? Use night lights. Install grab bars by the toilet and in the tub and shower. Do not use towel bars as grab bars. Use non-skid mats or decals in the tub or shower. If you need to sit down in the shower, use a plastic, non-slip stool. Keep  the floor dry. Clean up any water that spills on the floor as soon as it happens. Remove soap buildup in the tub or shower regularly. Attach bath mats securely with double-sided non-slip rug tape. Do not have throw rugs and other things on the floor that can make you trip. What can I do in the bedroom? Use night lights. Make sure that you have a light by your bed that is easy to reach. Do not use any sheets or blankets that are too big for your bed. They should not hang down onto the floor. Have a firm chair that has side arms. You can use this for support while you get dressed. Do not have throw rugs and other things on the floor that can make you trip. What can I do in the kitchen? Clean up any spills right away. Avoid walking on wet floors. Keep items that you use a lot in easy-to-reach places. If you need to reach something above you, use a strong step stool that has a grab bar. Keep electrical cords out of the way. Do not use floor polish or wax that makes floors slippery. If you must use wax, use non-skid floor wax. Do  not have throw rugs and other things on the floor that can make you trip. What can I do with my stairs? Do not leave any items on the stairs. Make sure that there are handrails on both sides of the stairs and use them. Fix handrails that are broken or loose. Make sure that handrails are as long as the stairways. Check any carpeting to make sure that it is firmly attached to the stairs. Fix any carpet that is loose or worn. Avoid having throw rugs at the top or bottom of the stairs. If you do have throw rugs, attach them to the floor with carpet tape. Make sure that you have a light switch at the top of the stairs and the bottom of the stairs. If you do not have them, ask someone to add them for you. What else can I do to help prevent falls? Wear shoes that: Do not have high heels. Have rubber bottoms. Are comfortable and fit you well. Are closed at the toe. Do not  wear sandals. If you use a stepladder: Make sure that it is fully opened. Do not climb a closed stepladder. Make sure that both sides of the stepladder are locked into place. Ask someone to hold it for you, if possible. Clearly mark and make sure that you can see: Any grab bars or handrails. First and last steps. Where the edge of each step is. Use tools that help you move around (mobility aids) if they are needed. These include: Canes. Walkers. Scooters. Crutches. Turn on the lights when you go into a dark area. Replace any light bulbs as soon as they burn out. Set up your furniture so you have a clear path. Avoid moving your furniture around. If any of your floors are uneven, fix them. If there are any pets around you, be aware of where they are. Review your medicines with your doctor. Some medicines can make you feel dizzy. This can increase your chance of falling. Ask your doctor what other things that you can do to help prevent falls. This information is not intended to replace advice given to you by your health care provider. Make sure you discuss any questions you have with your health care provider. Document Released: 04/23/2009 Document Revised: 12/03/2015 Document Reviewed: 08/01/2014 Elsevier Interactive Patient Education  2017 [employer].

## 2023-02-01 NOTE — Progress Notes (Addendum)
Subjective:   Mark Cohen is a 72 y.o. male who presents for Medicare Annual/Subsequent preventive examination.  Visit Complete: In person  Patient Medicare AWV questionnaire was completed by the patient on 02/01/2023; I have confirmed that all information answered by patient is correct and no changes since this date.  Review of Systems     Cardiac Risk Factors include: advanced age (>42men, >22 women);dyslipidemia;family history of premature cardiovascular disease;hypertension;male gender     Objective:    Today's Vitals   02/01/23 1509 02/01/23 1510  BP: 122/70   Pulse: 85   Temp: 98.3 F (36.8 C)   TempSrc: Temporal   SpO2: 97%   Weight: 163 lb 3.2 oz (74 kg)   Height: 6' (1.829 m)   PainSc: 7  7   PainLoc: Back    Body mass index is 22.13 kg/m.     02/01/2023    3:15 PM 11/22/2017   11:10 AM 09/08/2017   10:34 AM 09/07/2016    9:17 AM 01/17/2016   10:14 AM  Advanced Directives  Does Patient Have a Medical Advance Directive? Yes No No No Yes  Type of Estate agent of [place];Living will    Healthcare Power of [place];Living will  Does patient want to make changes to medical advance directive?     No - Patient declined  Copy of Healthcare Power of Attorney in Chart? No - copy requested    Yes  Would patient like information on creating a medical advance directive?  No - Patient declined No - Patient declined No - Patient declined     Current Medications (verified) Outpatient Encounter Medications as of 02/01/2023  Medication Sig   ALPRAZolam (XANAX) 0.25 MG tablet TAKE ONE TABLET BY MOUTH TWICE A DAY AS NEEDED   cholecalciferol (VITAMIN D) 1000 UNITS tablet Take 5,000 Units by mouth daily.   esomeprazole (NEXIUM) 40 MG capsule TAKE 1 CAPSULE BY MOUTH DAILY   HYDROcodone-acetaminophen (NORCO) 10-325 MG tablet Take 1 tablet by mouth every 8 (eight) hours.   rosuvastatin (CRESTOR) 5 MG tablet Take 1 tablet (5 mg total) by mouth daily.   No  facility-administered encounter medications on file as of 02/01/2023.    Allergies (verified) Patient has no known allergies.   History: Past Medical History:  Diagnosis Date   Allergic rhinitis    Colon polyps    Epididymal cyst    right   GERD (gastroesophageal reflux disease)    Hepatitis 1961   hepatitis a as child, no liver oproblems since   Hip pain    both   Past Surgical History:  Procedure Laterality Date   APPENDECTOMY     colonscopy  07/27/2016   EPIDIDYMECTOMY  08/29/2002   Sees Dr Patsi Sears twice a year left side   EPIDIDYMECTOMY Right 09/09/2016   Procedure: EPIDIDYMECTOMY;  Surgeon: Jethro Bolus, MD;  Location: [facility];  Service: Urology;  Laterality: Right;   NASAL SINUS SURGERY  2008   Family History  Problem Relation Age of Onset   Arthritis Unknown    Stroke Unknown    Cancer Neg Hx    Social History   Socioeconomic History   Marital status: Married    Spouse name: Not on file   Number of children: Not on file   Years of education: Not on file   Highest education level: Not on file  Occupational History   Occupation: Retired  Tobacco Use   Smoking status: Former    Current packs/day: 1.00  Average packs/day: 1 pack/day for 7.0 years (7.0 ttl pk-yrs)    Types: Cigarettes    Passive exposure: Never   Smokeless tobacco: Never  Substance and Sexual Activity   Alcohol use: Yes    Alcohol/week: 21.0 standard drinks of alcohol    Types: 21 Cans of beer per week   Drug use: No   Sexual activity: Not Currently    Partners: Female  Other Topics Concern   Not on file  Social History Narrative   Regular Exercise -  YES         Social Determinants of Health   Financial Resource Strain: Low Risk  (02/01/2023)   Overall Financial Resource Strain (CARDIA)    Difficulty of Paying Living Expenses: Not hard at all  Food Insecurity: No Food Insecurity (02/01/2023)   Hunger Vital Sign    Worried About Running Out of Food in the Last Year:  Never true    Ran Out of Food in the Last Year: Never true  Transportation Needs: No Transportation Needs (02/01/2023)   PRAPARE - Administrator, Civil Service (Medical): No    Lack of Transportation (Non-Medical): No  Physical Activity: Sufficiently Active (02/01/2023)   Exercise Vital Sign    Days of Exercise per Week: 5 days    Minutes of Exercise per Session: 30 min  Stress: No Stress Concern Present (02/01/2023)   [employer] of Occupational Health - Occupational Stress Questionnaire    Feeling of Stress : Not at all  Social Connections: Unknown (02/01/2023)   Social Connection and Isolation Panel [NHANES]    Frequency of Communication with Friends and Family: More than three times a week    Frequency of Social Gatherings with Friends and Family: Three times a week    Attends Religious Services: Not on file    Active Member of Clubs or Organizations: No    Attends Banker Meetings: Not on file    Marital Status: Divorced    Tobacco Counseling Counseling given: Not Answered   Clinical Intake:  Pre-visit preparation completed: Yes  Pain : 0-10 Pain Score: 7  Pain Type: Chronic pain Pain Location: Back Pain Orientation: Lower Pain Radiating Towards: Left side buttock Pain Descriptors / Indicators: Constant, Discomfort, Shooting, Pressure, Throbbing     BMI - recorded: 22.13 Nutritional Status: BMI of 19-24  Normal Nutritional Risks: None Diabetes: No  How often do you need to have someone help you when you read instructions, pamphlets, or other written materials from your doctor or pharmacy?: 1 - Never What is the last grade level you completed in school?: HSG  Interpreter Needed?: No  Information entered by :: Susie Cassette, LPN.   Activities of Daily Living    02/01/2023    3:16 PM 02/01/2023   12:54 PM  In your present state of health, do you have any difficulty performing the following activities:  Hearing? 0 0  Vision? 0  0  Difficulty concentrating or making decisions? 0 0  Walking or climbing stairs? 0 0  Dressing or bathing? 0 0  Doing errands, shopping? 0 0  Preparing Food and eating ? N N  Using the Toilet? N N  In the past six months, have you accidently leaked urine? N N  Do you have problems with loss of bowel control? N N  Managing your Medications? N N  Managing your Finances? N N  Housekeeping or managing your Housekeeping? N N    Patient Care Team: Sanda Linger  L, MD as PCP - General  Indicate any recent Medical Services you may have received from other than [facility] providers in the past year (date may be approximate).     Assessment:   This is a routine wellness examination for Noe.  Hearing/Vision screen Hearing Screening - Comments:: Patient denied any hearing difficulty.   No hearing aids.  Vision Screening - Comments:: Patient does wear corrective lenses/contacts.  Annual eye exam done by: Stacy Hutto, OD. Cataracts removed by: Sinda Du, MD.   Dietary issues and exercise activities discussed:     Goals Addressed             This Visit's Progress    Client understands the importance of follow-up with providers by attending scheduled visits        Depression Screen    02/01/2023    3:16 PM 09/05/2022    3:39 PM 04/07/2022    2:13 PM 07/22/2020    1:13 PM 05/27/2019   11:41 AM 04/25/2018    1:00 PM 04/24/2018    3:59 PM  PHQ 2/9 Scores  PHQ - 2 Score 0 0 0 0 0 0 0  PHQ- 9 Score 0  1        Fall Risk    02/01/2023    3:16 PM 02/01/2023   12:54 PM 09/05/2022    3:39 PM 04/07/2022    2:13 PM 07/22/2020    1:14 PM  Fall Risk   Falls in the past year? 0 0 0 0 0  Number falls in past yr: 0  0 0   Injury with Fall? 0  0 0   Risk for fall due to : No Fall Risks  No Fall Risks No Fall Risks   Follow up Falls prevention discussed  Falls evaluation completed Falls evaluation completed     MEDICARE RISK AT HOME:  Medicare Risk at Home - 02/01/23 1517     Any  stairs in or around the home? No    If so, are there any without handrails? No    Home free of loose throw rugs in walkways, pet beds, electrical cords, etc? Yes    Adequate lighting in your home to reduce risk of falls? Yes    Life alert? No    Use of a cane, walker or w/c? No    Grab bars in the bathroom? No    Shower chair or bench in shower? No    Elevated toilet seat or a handicapped toilet? No             TIMED UP AND GO:  Was the test performed?  Yes  Length of time to ambulate 10 feet: 8 sec Gait steady and fast without use of assistive device    Cognitive Function:        02/01/2023    3:16 PM  6CIT Screen  What Year? 0 points  What month? 0 points  What time? 0 points  Count back from 20 0 points  Months in reverse 0 points  Repeat phrase 0 points  Total Score 0 points    Immunizations Immunization History  Administered Date(s) Administered   Fluad Quad(high Dose 65+) 05/27/2019   Influenza Whole 05/01/2013   Influenza, High Dose Seasonal PF 04/22/2016, 05/10/2017, 04/24/2018   Influenza-Unspecified 05/01/2013   Janssen (J&J) SARS-COV-2 Vaccination 03/06/2020   Pneumococcal Conjugate-13 04/24/2018   Pneumococcal Polysaccharide-23 01/21/2015, 04/07/2022   Td 07/11/2008   Tdap 11/23/2008, 05/27/2019   Zoster, Live 01/18/2013  TDAP status: Up to date  Flu Vaccine status: Up to date  Pneumococcal vaccine status: Up to date  Covid-19 vaccine status: Completed vaccines  Qualifies for Shingles Vaccine? Yes   Zostavax completed Yes   Shingrix Completed?: No.    Education has been provided regarding the importance of this vaccine. Patient has been advised to call insurance company to determine out of pocket expense if they have not yet received this vaccine. Advised may also receive vaccine at local pharmacy or Health Dept. Verbalized acceptance and understanding.  Screening Tests Health Maintenance  Topic Date Due   Zoster Vaccines- Shingrix (1  of 2) 07/08/1969   INFLUENZA VACCINE  02/09/2023   Medicare Annual Wellness (AWV)  02/01/2024   Colonoscopy  01/13/2029   DTaP/Tdap/Td (4 - Td or Tdap) 05/26/2029   Pneumonia Vaccine 76+ Years old  Completed   Hepatitis C Screening  Completed   HPV VACCINES  Aged Out   COVID-19 Vaccine  Discontinued    Health Maintenance  Health Maintenance Due  Topic Date Due   Zoster Vaccines- Shingrix (1 of 2) 07/08/1969    Colorectal cancer screening: Type of screening: Colonoscopy. Completed 01/13/2022. Repeat every 7 years  Lung Cancer Screening: (Low Dose CT Chest recommended if Age 67-80 years, 20 pack-year currently smoking OR have quit w/in 15years.) does not qualify.   Lung Cancer Screening Referral: no  Additional Screening:  Hepatitis C Screening: does qualify; Completed 01/20/2016  Vision Screening: Recommended annual ophthalmology exams for early detection of glaucoma and other disorders of the eye. Is the patient up to date with their annual eye exam?  Yes  Who is the provider or what is the name of the office in which the patient attends annual eye exams? Stacey Hutto, OD. If pt is not established with a provider, would they like to be referred to a provider to establish care? No .   Dental Screening: Recommended annual dental exams for proper oral hygiene  Diabetic Foot Exam: N/A  Community Resource Referral / Chronic Care Management: CRR required this visit?  No   CCM required this visit?  No     Plan:     I have personally reviewed and noted the following in the patient's chart:   Medical and social history Use of alcohol, tobacco or illicit drugs  Current medications and supplements including opioid prescriptions. Patient is currently taking opioid prescriptions. Information provided to patient regarding non-opioid alternatives. Patient advised to discuss non-opioid treatment plan with their provider. Functional ability and status Nutritional status Physical  activity Advanced directives List of other physicians Hospitalizations, surgeries, and ER visits in previous 12 months Vitals Screenings to include cognitive, depression, and falls Referrals and appointments  In addition, I have reviewed and discussed with patient certain preventive protocols, quality metrics, and best practice recommendations. A written personalized care plan for preventive services as well as general preventive health recommendations were provided to patient.     Mickeal Needy, LPN   [id number]   After Visit Summary: Mailed to patient.   Nurse Notes: Normal cognitive status assessed by direct observation by this Nurse Health Advisor. No abnormalities found.

## 2023-02-03 ENCOUNTER — Encounter: Payer: Self-pay | Admitting: Internal Medicine

## 2023-02-03 ENCOUNTER — Ambulatory Visit (INDEPENDENT_AMBULATORY_CARE_PROVIDER_SITE_OTHER): Payer: Medicare HMO | Admitting: Internal Medicine

## 2023-02-03 VITALS — BP 138/78 | HR 85 | Temp 98.0°F | Ht 72.0 in | Wt 168.0 lb

## 2023-02-03 DIAGNOSIS — M25551 Pain in right hip: Secondary | ICD-10-CM | POA: Diagnosis not present

## 2023-02-03 DIAGNOSIS — I10 Essential (primary) hypertension: Secondary | ICD-10-CM | POA: Diagnosis not present

## 2023-02-03 DIAGNOSIS — H00021 Hordeolum internum right upper eyelid: Secondary | ICD-10-CM

## 2023-02-03 DIAGNOSIS — E785 Hyperlipidemia, unspecified: Secondary | ICD-10-CM | POA: Diagnosis not present

## 2023-02-03 DIAGNOSIS — M5416 Radiculopathy, lumbar region: Secondary | ICD-10-CM

## 2023-02-03 DIAGNOSIS — G8929 Other chronic pain: Secondary | ICD-10-CM

## 2023-02-03 DIAGNOSIS — N400 Enlarged prostate without lower urinary tract symptoms: Secondary | ICD-10-CM

## 2023-02-03 DIAGNOSIS — M25552 Pain in left hip: Secondary | ICD-10-CM

## 2023-02-03 DIAGNOSIS — Z0000 Encounter for general adult medical examination without abnormal findings: Secondary | ICD-10-CM

## 2023-02-03 DIAGNOSIS — M545 Low back pain, unspecified: Secondary | ICD-10-CM

## 2023-02-03 DIAGNOSIS — M533 Sacrococcygeal disorders, not elsewhere classified: Secondary | ICD-10-CM

## 2023-02-03 DIAGNOSIS — Z0001 Encounter for general adult medical examination with abnormal findings: Secondary | ICD-10-CM | POA: Diagnosis not present

## 2023-02-03 DIAGNOSIS — M159 Polyosteoarthritis, unspecified: Secondary | ICD-10-CM

## 2023-02-03 DIAGNOSIS — M150 Primary generalized (osteo)arthritis: Secondary | ICD-10-CM

## 2023-02-03 LAB — TSH: TSH: 0.65 u[IU]/mL (ref 0.35–5.50)

## 2023-02-03 LAB — URINALYSIS, ROUTINE W REFLEX MICROSCOPIC
Bilirubin Urine: NEGATIVE
Hgb urine dipstick: NEGATIVE
Ketones, ur: NEGATIVE
Leukocytes,Ua: NEGATIVE
Nitrite: NEGATIVE
Specific Gravity, Urine: 1.01 (ref 1.000–1.030)
Total Protein, Urine: NEGATIVE
Urine Glucose: NEGATIVE
Urobilinogen, UA: 0.2 (ref 0.0–1.0)
pH: 6 (ref 5.0–8.0)

## 2023-02-03 LAB — LIPID PANEL
Cholesterol: 166 mg/dL (ref 0–200)
HDL: 88.1 mg/dL (ref >39.00)
LDL Cholesterol: 68 mg/dL (ref 0–99)
NonHDL: 77.59
Total CHOL/HDL Ratio: 2
Triglycerides: 47 mg/dL (ref 0.0–149.0)
VLDL: 9.4 mg/dL (ref 0.0–40.0)

## 2023-02-03 LAB — HEPATIC FUNCTION PANEL
ALT: 14 U/L (ref 0–53)
AST: 19 U/L (ref 0–37)
Albumin: 4.6 g/dL (ref 3.5–5.2)
Alkaline Phosphatase: 79 U/L (ref 39–117)
Bilirubin, Direct: 0.2 mg/dL (ref 0.0–0.3)
Total Bilirubin: 0.7 mg/dL (ref 0.2–1.2)
Total Protein: 6.7 g/dL (ref 6.0–8.3)

## 2023-02-03 LAB — PSA: PSA: 0.11 ng/mL (ref 0.10–4.00)

## 2023-02-03 MED ORDER — HYDROCODONE-ACETAMINOPHEN 10-325 MG PO TABS
1.0000 | ORAL_TABLET | Freq: Three times a day (TID) | ORAL | 0 refills | Status: DC
Start: 2023-02-03 — End: 2023-03-30

## 2023-02-03 MED ORDER — SULFAMETHOXAZOLE-TRIMETHOPRIM 800-160 MG PO TABS: 1.0000 | ORAL_TABLET | Freq: Two times a day (BID) | ORAL | 0 refills | Status: AC

## 2023-02-03 NOTE — Patient Instructions (Signed)
Health Maintenance, Male Adopting a healthy lifestyle and getting preventive care are important in promoting health and wellness. Ask your health care provider about: The right schedule for you to have regular tests and exams. Things you can do on your own to prevent diseases and keep yourself healthy. What should I know about diet, weight, and exercise? Eat a healthy diet  Eat a diet that includes plenty of vegetables, fruits, low-fat dairy products, and lean protein. Do not eat a lot of foods that are high in solid fats, added sugars, or sodium. Maintain a healthy weight Body mass index (BMI) is a measurement that can be used to identify possible weight problems. It estimates body fat based on height and weight. Your health care provider can help determine your BMI and help you achieve or maintain a healthy weight. Get regular exercise Get regular exercise. This is one of the most important things you can do for your health. Most adults should: Exercise for at least 150 minutes each week. The exercise should increase your heart rate and make you sweat (moderate-intensity exercise). Do strengthening exercises at least twice a week. This is in addition to the moderate-intensity exercise. Spend less time sitting. Even light physical activity can be beneficial. Watch cholesterol and blood lipids Have your blood tested for lipids and cholesterol at 73 years of age, then have this test every 5 years. You may need to have your cholesterol levels checked more often if: Your lipid or cholesterol levels are high. You are older than 73 years of age. You are at high risk for heart disease. What should I know about cancer screening? Many types of cancers can be detected early and may often be prevented. Depending on your health history and family history, you may need to have cancer screening at various ages. This may include screening for: Colorectal cancer. Prostate cancer. Skin cancer. Lung  cancer. What should I know about heart disease, diabetes, and high blood pressure? Blood pressure and heart disease High blood pressure causes heart disease and increases the risk of stroke. This is more likely to develop in people who have high blood pressure readings or are overweight. Talk with your health care provider about your target blood pressure readings. Have your blood pressure checked: Every 3-5 years if you are 18-39 years of age. Every year if you are 40 years old or older. If you are between the ages of 65 and 75 and are a current or former smoker, ask your health care provider if you should have a one-time screening for abdominal aortic aneurysm (AAA). Diabetes Have regular diabetes screenings. This checks your fasting blood sugar level. Have the screening done: Once every three years after age 45 if you are at a normal weight and have a low risk for diabetes. More often and at a younger age if you are overweight or have a high risk for diabetes. What should I know about preventing infection? Hepatitis B If you have a higher risk for hepatitis B, you should be screened for this virus. Talk with your health care provider to find out if you are at risk for hepatitis B infection. Hepatitis C Blood testing is recommended for: Everyone born from 1945 through 1965. Anyone with known risk factors for hepatitis C. Sexually transmitted infections (STIs) You should be screened each year for STIs, including gonorrhea and chlamydia, if: You are sexually active and are younger than 73 years of age. You are older than 73 years of age and your   health care provider tells you that you are at risk for this type of infection. Your sexual activity has changed since you were last screened, and you are at increased risk for chlamydia or gonorrhea. Ask your health care provider if you are at risk. Ask your health care provider about whether you are at high risk for HIV. Your health care provider  may recommend a prescription medicine to help prevent HIV infection. If you choose to take medicine to prevent HIV, you should first get tested for HIV. You should then be tested every 3 months for as long as you are taking the medicine. Follow these instructions at home: Alcohol use Do not drink alcohol if your health care provider tells you not to drink. If you drink alcohol: Limit how much you have to 0-2 drinks a day. Know how much alcohol is in your drink. In the [place] one drink equals one 12 oz bottle of beer (355 mL), one 5 oz glass of wine (148 mL), or one 1 oz glass of hard liquor (44 mL). Lifestyle Do not use any products that contain nicotine or tobacco. These products include cigarettes, chewing tobacco, and vaping devices, such as e-cigarettes. If you need help quitting, ask your health care provider. Do not use street drugs. Do not share needles. Ask your health care provider for help if you need support or information about quitting drugs. General instructions Schedule regular health, dental, and eye exams. Stay current with your vaccines. Tell your health care provider if: You often feel depressed. You have ever been abused or do not feel safe at home. Summary Adopting a healthy lifestyle and getting preventive care are important in promoting health and wellness. Follow your health care provider's instructions about healthy diet, exercising, and getting tested or screened for diseases. Follow your health care provider's instructions on monitoring your cholesterol and blood pressure. This information is not intended to replace advice given to you by your health care provider. Make sure you discuss any questions you have with your health care provider. Document Revised: 11/16/2020 Document Reviewed: 11/16/2020 Elsevier Patient Education  2024 [employer]  

## 2023-02-03 NOTE — Progress Notes (Signed)
Subjective:  Patient ID: Mark Cohen, male    DOB: 1950-07-31  Age: 72 y.o. MRN: [id number]  CC: Annual Exam, Hypertension, Hyperlipidemia, Back Pain, and Osteoarthritis   HPI [name] presents for a CPX and f/up ----  Discussed the use of AI scribe software for clinical note transcription with the patient, who gave verbal consent to proceed.  History of Present Illness   The patient, a 72 year old with a history of spinal growth, presents with persistent and worsening back and hip pain. The pain, described as 'fire-like,' is not localized but varies in location and intensity. It is particularly severe in the left flank, low back, and radiates into the buttocks. The pain is not associated with leg or foot discomfort. However, sitting exacerbates the pain.  The patient's pain management regimen includes hydrocodone-acetaminophen, which was increased from 5 to 10 a few months ago. Despite this, the patient reports that the pain remains prevalent and is not fully controlled by the medication.  The patient also reports a new symptom of a raised area in the eye, suspected to be a stye. This is the second occurrence this year, with the previous episode treated with eye drops and antibiotics. The patient also reports a large floater in the eye, which has been previously evaluated and deemed not problematic.  The patient denies any urinary symptoms, chest pain, shortness of breath, or cold sweats during physical activity (pushing a lawn mower). He also denies any symptoms of high blood pressure such as headache or blurred vision. The patient has been monitoring their blood pressure at home, which has been within normal limits.         Outpatient Medications Prior to Visit  Medication Sig Dispense Refill   ALPRAZolam (XANAX) 0.25 MG tablet TAKE ONE TABLET BY MOUTH TWICE A DAY AS NEEDED 35 tablet 3   cholecalciferol (VITAMIN D) 1000 UNITS tablet Take 5,000 Units by mouth daily.      esomeprazole (NEXIUM) 40 MG capsule TAKE 1 CAPSULE BY MOUTH DAILY 90 capsule 1   rosuvastatin (CRESTOR) 5 MG tablet Take 1 tablet (5 mg total) by mouth daily. 90 tablet 1   HYDROcodone-acetaminophen (NORCO) 10-325 MG tablet Take 1 tablet by mouth every 8 (eight) hours. 90 tablet 0   No facility-administered medications prior to visit.    ROS Review of Systems  Constitutional: Negative.  Negative for appetite change, chills, diaphoresis and fatigue.  HENT: Negative.    Eyes: Negative.   Respiratory: Negative.  Negative for cough, chest tightness, shortness of breath and wheezing.   Cardiovascular:  Negative for chest pain, palpitations and leg swelling.  Gastrointestinal: Negative.  Negative for abdominal pain, constipation and diarrhea.  Genitourinary:  Positive for flank pain. Negative for difficulty urinating and dysuria.  Musculoskeletal:  Positive for arthralgias and back pain. Negative for gait problem, joint swelling and myalgias.  Skin: Negative.  Negative for color change and pallor.  Neurological: Negative.  Negative for dizziness and weakness.  Hematological:  Negative for adenopathy. Does not bruise/bleed easily.  Psychiatric/Behavioral:  Negative for confusion, decreased concentration, sleep disturbance and suicidal ideas. The patient is nervous/anxious.     Objective:  BP 138/78 (BP Location: Left Arm, Patient Position: Sitting, Cuff Size: Large)   Pulse 85   Temp 98 F (36.7 C) (Oral)   Ht 6' (1.829 m)   Wt 168 lb (76.2 kg)   SpO2 97%   BMI 22.78 kg/m   BP Readings from Last 3 Encounters:  02/03/23  138/78  02/01/23 122/70  09/05/22 130/78    Wt Readings from Last 3 Encounters:  02/03/23 168 lb (76.2 kg)  02/01/23 163 lb 3.2 oz (74 kg)  09/05/22 171 lb (77.6 kg)    Physical Exam Vitals reviewed.  Constitutional:      Appearance: Normal appearance.  HENT:     Mouth/Throat:     Mouth: Mucous membranes are moist.  Eyes:     General: No scleral  icterus.    Conjunctiva/sclera: Conjunctivae normal.  Cardiovascular:     Rate and Rhythm: Normal rate and regular rhythm.     Heart sounds: No murmur heard. Pulmonary:     Effort: Pulmonary effort is normal.     Breath sounds: No stridor. No wheezing, rhonchi or rales.  Abdominal:     General: Abdomen is flat.     Tenderness: There is no abdominal tenderness. There is no guarding or rebound.     Hernia: No hernia is present.  Musculoskeletal:        General: Normal range of motion.     Cervical back: Neck supple.     Right lower leg: No edema.     Left lower leg: No edema.  Lymphadenopathy:     Cervical: No cervical adenopathy.  Skin:    General: Skin is warm and dry.  Neurological:     General: No focal deficit present.     Mental Status: He is alert. Mental status is at baseline.  Psychiatric:        Attention and Perception: He is attentive.        Mood and Affect: Mood is anxious.        Speech: Speech normal.        Behavior: Behavior normal.        Thought Content: Thought content normal.        Judgment: Judgment normal.     Lab Results  Component Value Date   WBC 6.5 09/05/2022   HGB 14.4 09/05/2022   HCT 41.7 09/05/2022   PLT 231.0 09/05/2022   GLUCOSE 85 09/05/2022   CHOL 186 10/12/2021   TRIG 103.0 10/12/2021   HDL 95.30 10/12/2021   LDLCALC 70 10/12/2021   ALT 17 10/12/2021   AST 26 10/12/2021   NA 139 09/05/2022   K 4.5 09/05/2022   CL 103 09/05/2022   CREATININE 0.88 09/05/2022   BUN 13 09/05/2022   CO2 31 09/05/2022   TSH 1.82 10/12/2021   PSA 0.10 10/12/2021   HGBA1C 5.5 09/14/2017    No results found.  Assessment & Plan:  Benign prostatic hyperplasia without lower urinary tract symptoms -     PSA; Future -     Urinalysis, Routine w reflex microscopic; Future  Chronic bilateral low back pain without sciatica -     HYDROcodone-Acetaminophen; Take 1 tablet by mouth every 8 (eight) hours.  Dispense: 90 tablet; Refill: 0  Chronic pain  of both hips -     HYDROcodone-Acetaminophen; Take 1 tablet by mouth every 8 (eight) hours.  Dispense: 90 tablet; Refill: 0  Essential hypertension, benign -     Urinalysis, Routine w reflex microscopic; Future -     Hepatic function panel; Future  Hyperlipidemia with target LDL less than 130 -     Lipoprotein A (LPA); Future -     Lipid panel; Future -     TSH; Future -     Hepatic function panel; Future  Routine general medical examination at a health  care facility  Hordeolum internum of right upper eyelid -     Sulfamethoxazole-Trimethoprim; Take 1 tablet by mouth 2 (two) times daily for 7 days.  Dispense: 14 tablet; Refill: 0  Left lumbar radiculitis -     HYDROcodone-Acetaminophen; Take 1 tablet by mouth every 8 (eight) hours.  Dispense: 90 tablet; Refill: 0  Primary osteoarthritis involving multiple joints -     HYDROcodone-Acetaminophen; Take 1 tablet by mouth every 8 (eight) hours.  Dispense: 90 tablet; Refill: 0     Follow-up: Return in about 3 months (around 05/06/2023).  Sanda Linger, MD

## 2023-02-21 ENCOUNTER — Telehealth: Payer: Self-pay | Admitting: Internal Medicine

## 2023-02-21 NOTE — Telephone Encounter (Signed)
Patient called and said that his insurance has denied the lower spine mri but they approved the pelvic mri.  Patient has questions concerning this.  Please call patient at [phone]

## 2023-02-22 NOTE — Telephone Encounter (Signed)
The MRI pelvis was approved Let's start with that

## 2023-03-03 ENCOUNTER — Other Ambulatory Visit: Payer: Medicare HMO

## 2023-03-23 ENCOUNTER — Other Ambulatory Visit: Payer: Medicare HMO

## 2023-03-23 ENCOUNTER — Ambulatory Visit
Admission: RE | Admit: 2023-03-23 | Discharge: 2023-03-23 | Disposition: A | Discharge status: Home / Self Care | Payer: Medicare HMO | Source: Ambulatory Visit | Attending: Internal Medicine | Admitting: Internal Medicine

## 2023-03-23 DIAGNOSIS — M533 Sacrococcygeal disorders, not elsewhere classified: Secondary | ICD-10-CM

## 2023-03-23 DIAGNOSIS — M25551 Pain in right hip: Secondary | ICD-10-CM | POA: Diagnosis not present

## 2023-03-23 DIAGNOSIS — M545 Low back pain, unspecified: Secondary | ICD-10-CM

## 2023-03-23 DIAGNOSIS — G8929 Other chronic pain: Secondary | ICD-10-CM

## 2023-03-23 DIAGNOSIS — M25552 Pain in left hip: Secondary | ICD-10-CM | POA: Diagnosis not present

## 2023-03-23 MED ORDER — GADOPICLENOL 0.5 MMOL/ML IV SOLN
8.0000 mL | Freq: Once | INTRAVENOUS | Status: AC | PRN
  Administered 2023-03-23: 8 mL via INTRAVENOUS

## 2023-03-29 ENCOUNTER — Encounter: Payer: Self-pay | Admitting: Internal Medicine

## 2023-03-29 ENCOUNTER — Ambulatory Visit (INDEPENDENT_AMBULATORY_CARE_PROVIDER_SITE_OTHER): Payer: Medicare HMO | Admitting: Internal Medicine

## 2023-03-29 VITALS — BP 138/82 | HR 61 | Temp 98.0°F | Ht 72.0 in | Wt 165.0 lb

## 2023-03-29 DIAGNOSIS — M533 Sacrococcygeal disorders, not elsewhere classified: Secondary | ICD-10-CM

## 2023-03-29 DIAGNOSIS — M25552 Pain in left hip: Secondary | ICD-10-CM

## 2023-03-29 DIAGNOSIS — M150 Primary generalized (osteo)arthritis: Secondary | ICD-10-CM

## 2023-03-29 DIAGNOSIS — M159 Polyosteoarthritis, unspecified: Secondary | ICD-10-CM | POA: Diagnosis not present

## 2023-03-29 DIAGNOSIS — F411 Generalized anxiety disorder: Secondary | ICD-10-CM

## 2023-03-29 DIAGNOSIS — R7989 Other specified abnormal findings of blood chemistry: Secondary | ICD-10-CM

## 2023-03-29 DIAGNOSIS — I4891 Unspecified atrial fibrillation: Secondary | ICD-10-CM | POA: Diagnosis not present

## 2023-03-29 DIAGNOSIS — M5416 Radiculopathy, lumbar region: Secondary | ICD-10-CM

## 2023-03-29 DIAGNOSIS — M545 Low back pain, unspecified: Secondary | ICD-10-CM | POA: Diagnosis not present

## 2023-03-29 DIAGNOSIS — I10 Essential (primary) hypertension: Secondary | ICD-10-CM

## 2023-03-29 DIAGNOSIS — M25551 Pain in right hip: Secondary | ICD-10-CM | POA: Diagnosis not present

## 2023-03-29 DIAGNOSIS — G8929 Other chronic pain: Secondary | ICD-10-CM | POA: Diagnosis not present

## 2023-03-29 DIAGNOSIS — R682 Dry mouth, unspecified: Secondary | ICD-10-CM

## 2023-03-29 LAB — BRAIN NATRIURETIC PEPTIDE: Pro B Natriuretic peptide (BNP): 785 pg/mL — ABNORMAL HIGH (ref 0.0–100.0)

## 2023-03-29 LAB — C-REACTIVE PROTEIN: CRP: 1.1 mg/dL (ref 0.5–20.0)

## 2023-03-29 LAB — TROPONIN I (HIGH SENSITIVITY): High Sens Troponin I: 16 ng/L (ref 2–17)

## 2023-03-29 MED ORDER — DILTIAZEM HCL ER COATED BEADS 120 MG PO CP24
120.0000 mg | ORAL_CAPSULE | Freq: Every day | ORAL | 1 refills | Status: DC
Start: 2023-03-29 — End: 2023-09-18

## 2023-03-29 NOTE — Patient Instructions (Signed)
Atrial Fibrillation Atrial fibrillation (AFib) is a type of irregular or rapid heartbeat (arrhythmia). In AFib, the top part of the heart (atria) beats in an irregular pattern. This makes the heart unable to pump blood normally and effectively. The goal of treatment is to prevent blood clots from forming, control your heart rate, or restore your heartbeat to a normal rhythm. If this condition is not treated, it can cause serious problems, such as a weakened heart muscle (cardiomyopathy) or a stroke. What are the causes? This condition is often caused by medical conditions that damage the heart's electrical system. These include: High blood pressure (hypertension). This is the most common cause. Certain heart problems or conditions, such as heart failure, coronary artery disease, heart valve problems, or heart surgery. Diabetes. Overactive thyroid (hyperthyroidism). Chronic kidney disease. Certain lung conditions, such as emphysema, pneumonia, or COPD. Obstructive sleep apnea. In some cases, the cause of this condition is not known. What increases the risk? This condition is more likely to develop in: Older adults. Athletes who do endurance exercise. People who have a family history of AFib. Males. People who are Caucasian. People who are obese. People who smoke or misuse alcohol. What are the signs or symptoms? Symptoms of this condition include: Fast or irregular heartbeats (palpitations). Discomfort or pain in your chest. Shortness of breath. Sudden light-headedness or weakness. Tiring easily during exercise or activity. Syncope (fainting). Sweating. In some cases, there are no symptoms. How is this diagnosed? Your health care provider may detect AFib when taking your pulse. If detected, this condition may be diagnosed with: An electrocardiogram (ECG) to check electrical signals of the heart. An ambulatory cardiac monitor to record your heart's activity for a few days. A  transthoracic echocardiogram (TTE) to create pictures of your heart. A transesophageal echocardiogram (TEE) to create even clearer pictures of your heart. A stress test to check your blood supply while you exercise. Imaging tests, such as a CT scan or chest X-ray. Blood tests. How is this treated? Treatment depends on underlying conditions and how you feel when you get AFib. This condition may be treated with: Medicines to prevent blood clots or to treat heart rate or heart rhythm problems. Electrical cardioversion to reset the heart's rhythm. A pacemaker to correct abnormal heart rhythm. Ablation to remove the heart tissue that sends abnormal signals. Left atrial appendage closure to seal the area where blood clots can form. In some cases, underlying conditions will be treated. Follow these instructions at home: Medicines Take over-the counter and prescription medicines only as told by your provider. Do not take any new medicines without talking to your provider. If you are taking blood thinners: Talk with your provider before taking aspirin or NSAIDs. These medicines can raise your risk of bleeding. Take your medicines as told. Take them at the same time each day. Do not do things that could hurt or bruise you. Be careful to avoid falls. Wear an alert bracelet or carry a card that says that you take blood thinners. Lifestyle Do not use any products that contain nicotine or tobacco. These products include cigarettes, chewing tobacco, and vaping devices, such as e-cigarettes. If you need help quitting, ask your provider. Eat heart-healthy foods. Talk with a food expert (dietitian) to make an eating plan that is right for you. Exercise regularly as told by your provider. Do not drink alcohol. Lose weight if you are overweight. General instructions If you have obstructive sleep apnea, manage your condition as told by your provider.  Do not use diet pills unless your provider approves. Diet  pills can make heart problems worse. Keep all follow-up visits. Your provider will want to check your heart rate and rhythm regularly. Contact a health care provider if: You notice a change in the rate, rhythm, or strength of your heartbeat. You are taking a blood thinner and you notice more bruising. You tire more easily when you exercise or do heavy work. You have a sudden change in weight. Get help right away if:  You have chest pain. You have trouble breathing. You have side effects of blood thinners, such as blood in your vomit, poop (stool), or pee (urine), or bleeding that does not stop. You have any symptoms of a stroke. "BE FAST" is an easy way to remember the main warning signs of a stroke: B - Balance. Signs are dizziness, sudden trouble walking, or loss of balance. E - Eyes. Signs are trouble seeing or a sudden change in vision. F - Face. Signs are sudden weakness or numbness of the face, or the face or eyelid drooping on one side. A - Arms. Signs are weakness or numbness in an arm. This happens suddenly and usually on one side of the body. S - Speech.Signs are sudden trouble speaking, slurred speech, or trouble understanding what people say. T - Time. Time to call emergency services. Write down what time symptoms started. Other signs of a stroke, such as: A sudden, severe headache with no known cause. Nausea or vomiting. Seizure. These symptoms may be an emergency. Get help right away. Call 911. Do not wait to see if the symptoms will go away. Do not drive yourself to the hospital. This information is not intended to replace advice given to you by your health care provider. Make sure you discuss any questions you have with your health care provider. Document Revised: 03/16/2022 Document Reviewed: 03/16/2022 Elsevier Patient Education  2024 [employer].

## 2023-03-29 NOTE — Progress Notes (Signed)
Subjective:  Patient ID: Mark Cohen, male    DOB: [birth year]  Age: 72 y.o. MRN: [id number]  CC: Back Pain, Hypertension, and Atrial Fibrillation   HPI Mark Cohen presents for f/up ----  Discussed the use of AI scribe software for clinical note transcription with the patient, who gave verbal consent to proceed.  History of Present Illness   The patient, with a history of chronic pain and an unidentified growth on the spine, presents with worsening pain symptoms. He reports a significant increase in pain following a period of physical exertion, including long-distance driving and walking, as well as moving heavy furniture. The pain is described as 'terrible' and widespread, likened to being 'beaten with baseball bats.' The patient also reports swelling in the hands and ankles, and a sensation of leg swelling.  The patient has been managing the pain with medication, but reports no relief. He has also tried heat and cold therapy, with only temporary relief from cold. The patient also reports increased fatigue, requiring frequent rest and sleep.  In addition to the pain, the patient has been experiencing symptoms of dry mouth for the past year and a half, which he suspects may be related to Sjogren's disease, a condition his mother had. He has been using over-the-counter mouth drops for relief.  The patient also reports a history of an unidentified growth on the spine, discovered in 2014 during an MRI scan. He was referred to a neurosurgeon, who determined the growth was not cancerous but could not identify it. The patient is unsure if this growth could be contributing to his current pain symptoms.  Despite the pain and fatigue, the patient denies experiencing any chest pain, shortness of breath, or numbness. He has been able to maintain physical activity, but the pain has significantly impacted his quality of life.       Outpatient Medications Prior to Visit  Medication Sig  Dispense Refill   cholecalciferol (VITAMIN D) 1000 UNITS tablet Take 5,000 Units by mouth daily.     esomeprazole (NEXIUM) 40 MG capsule TAKE 1 CAPSULE BY MOUTH DAILY 90 capsule 1   ALPRAZolam (XANAX) 0.25 MG tablet TAKE ONE TABLET BY MOUTH TWICE A DAY AS NEEDED 35 tablet 3   HYDROcodone-acetaminophen (NORCO) 10-325 MG tablet Take 1 tablet by mouth every 8 (eight) hours. 90 tablet 0   rosuvastatin (CRESTOR) 5 MG tablet Take 1 tablet (5 mg total) by mouth daily. 90 tablet 1   No facility-administered medications prior to visit.    ROS Review of Systems  Constitutional: Negative.  Negative for appetite change, diaphoresis, fatigue and unexpected weight change.  HENT: Negative.    Eyes: Negative.   Respiratory: Negative.  Negative for cough, chest tightness, shortness of breath and wheezing.   Cardiovascular:  Negative for chest pain, palpitations and leg swelling.  Gastrointestinal:  Negative for abdominal pain, blood in stool, diarrhea, nausea and vomiting.  Genitourinary: Negative.  Negative for difficulty urinating and dysuria.  Musculoskeletal:  Positive for arthralgias and back pain. Negative for myalgias.  Skin:  Negative for color change, pallor and rash.  Neurological: Negative.  Negative for dizziness and weakness.  Hematological:  Negative for adenopathy. Does not bruise/bleed easily.  Psychiatric/Behavioral:  Positive for confusion.     Objective:  BP 138/82 (BP Location: Left Arm, Patient Position: Sitting, Cuff Size: Large)   Pulse 61   Temp 98 F (36.7 C) (Oral)   Ht 6' (1.829 m)   Wt 165 lb (74.8 kg)  SpO2 97%   BMI 22.38 kg/m   BP Readings from Last 3 Encounters:  03/29/23 138/82  02/03/23 138/78  02/01/23 122/70    Wt Readings from Last 3 Encounters:  03/29/23 165 lb (74.8 kg)  02/03/23 168 lb (76.2 kg)  02/01/23 163 lb 3.2 oz (74 kg)    Physical Exam Vitals reviewed.  Constitutional:      Appearance: Normal appearance.  HENT:     Mouth/Throat:      Mouth: Mucous membranes are moist.  Eyes:     General: No scleral icterus.    Conjunctiva/sclera: Conjunctivae normal.  Cardiovascular:     Rate and Rhythm: Regular rhythm. Tachycardia present.     Heart sounds: No murmur heard.    No friction rub. No gallop.     Comments: EKG- A fib with RVR with PVCs, 109 bpm LAD No Q waves Pulmonary:     Effort: Pulmonary effort is normal.     Breath sounds: No stridor. No wheezing, rhonchi or rales.  Abdominal:     General: Abdomen is flat.     Palpations: There is no mass.     Tenderness: There is no abdominal tenderness. There is no guarding.     Hernia: No hernia is present.  Musculoskeletal:     Cervical back: Neck supple.     Right lower leg: No edema.     Left lower leg: No edema.  Skin:    General: Skin is warm and dry.  Neurological:     General: No focal deficit present.     Mental Status: He is alert.     Lab Results  Component Value Date   WBC 6.5 09/05/2022   HGB 14.4 09/05/2022   HCT 41.7 09/05/2022   PLT 231.0 09/05/2022   GLUCOSE 85 09/05/2022   CHOL 166 02/03/2023   TRIG 47.0 02/03/2023   HDL 88.10 02/03/2023   LDLCALC 68 02/03/2023   ALT 14 02/03/2023   AST 19 02/03/2023   NA 139 09/05/2022   K 4.5 09/05/2022   CL 103 09/05/2022   CREATININE 0.88 09/05/2022   BUN 13 09/05/2022   CO2 31 09/05/2022   TSH 0.65 02/03/2023   PSA 0.11 02/03/2023   HGBA1C 5.5 09/14/2017    No results found.  Assessment & Plan:   Essential hypertension, benign- His BP is well controlled. -     Basic metabolic panel; Future -     CBC with Differential/Platelet; Future  Dry mouth -     C-reactive protein; Future -     ANA; Future -     Sjogrens syndrome-A extractable nuclear antibody; Future -     Sjogrens syndrome-B extractable nuclear antibody; Future  Atrial fibrillation with RVR (HCC)- Will start a DOAC for stroke risk reduction.  Will control the rate with the CCB. -     dilTIAZem HCl ER Coated Beads; Take 1  capsule (120 mg total) by mouth daily.  Dispense: 90 capsule; Refill: 1 -     EKG 12-Lead -     Troponin I (High Sensitivity); Future -     D-dimer, quantitative; Future -     Brain natriuretic peptide; Future -     ECHOCARDIOGRAM COMPLETE; Future -     Ambulatory referral to Cardiology -     CT Angio Chest Pulmonary Embolism (PE) W or WO Contrast; Future -     Rivaroxaban; Take 1 tablet (20 mg total) by mouth daily with supper.  Dispense: 90 tablet; Refill:  0  Primary osteoarthritis involving multiple joints -     Sjogrens syndrome-A extractable nuclear antibody; Future -     Sjogrens syndrome-B extractable nuclear antibody; Future -     HYDROcodone-Acetaminophen; Take 1 tablet by mouth every 8 (eight) hours.  Dispense: 90 tablet; Refill: 0  Sacro-iliac pain -     C-reactive protein; Future -     ANA; Future -     Sjogrens syndrome-A extractable nuclear antibody; Future -     Sjogrens syndrome-B extractable nuclear antibody; Future  Elevated brain natriuretic peptide (BNP) level -     ECHOCARDIOGRAM COMPLETE; Future -     Ambulatory referral to Cardiology  GAD (generalized anxiety disorder) -     ALPRAZolam; Take 1 tablet (0.25 mg total) by mouth 2 (two) times daily as needed.  Dispense: 60 tablet; Refill: 3  Chronic bilateral low back pain without sciatica -     HYDROcodone-Acetaminophen; Take 1 tablet by mouth every 8 (eight) hours.  Dispense: 90 tablet; Refill: 0  Chronic pain of both hips -     HYDROcodone-Acetaminophen; Take 1 tablet by mouth every 8 (eight) hours.  Dispense: 90 tablet; Refill: 0  Left lumbar radiculitis -     HYDROcodone-Acetaminophen; Take 1 tablet by mouth every 8 (eight) hours.  Dispense: 90 tablet; Refill: 0  D-dimer, elevated- Will evaluate for PE with a CT angio. -     CT Angio Chest Pulmonary Embolism (PE) W or WO Contrast; Future     Follow-up: Return in about 4 months (around 07/29/2023).  Sanda Linger, MD

## 2023-03-30 ENCOUNTER — Other Ambulatory Visit: Payer: Self-pay | Admitting: Internal Medicine

## 2023-03-30 DIAGNOSIS — M159 Polyosteoarthritis, unspecified: Secondary | ICD-10-CM

## 2023-03-30 DIAGNOSIS — G8929 Other chronic pain: Secondary | ICD-10-CM

## 2023-03-30 DIAGNOSIS — F411 Generalized anxiety disorder: Secondary | ICD-10-CM

## 2023-03-30 DIAGNOSIS — M5416 Radiculopathy, lumbar region: Secondary | ICD-10-CM

## 2023-03-30 DIAGNOSIS — M545 Low back pain, unspecified: Secondary | ICD-10-CM

## 2023-03-30 LAB — SJOGRENS SYNDROME-B EXTRACTABLE NUCLEAR ANTIBODY: SSB (La) (ENA) Antibody, IgG: <1 AI

## 2023-03-30 LAB — ANA: Anti Nuclear Antibody (ANA): NEGATIVE

## 2023-03-30 LAB — D-DIMER, QUANTITATIVE: D-Dimer, Quant: 2.68 mcg/mL FEU — ABNORMAL HIGH (ref <0.50)

## 2023-03-30 LAB — SJOGRENS SYNDROME-A EXTRACTABLE NUCLEAR ANTIBODY: SSA (Ro) (ENA) Antibody, IgG: <1 AI

## 2023-03-30 MED ORDER — ALPRAZOLAM 0.25 MG PO TABS
0.2500 mg | ORAL_TABLET | Freq: Two times a day (BID) | ORAL | 3 refills | Status: DC | PRN
Start: 2023-03-30 — End: 2024-01-06

## 2023-03-30 MED ORDER — HYDROCODONE-ACETAMINOPHEN 10-325 MG PO TABS: 1.0000 | ORAL_TABLET | Freq: Three times a day (TID) | ORAL | 0 refills | Status: DC

## 2023-03-31 ENCOUNTER — Encounter: Payer: Self-pay | Admitting: Internal Medicine

## 2023-03-31 ENCOUNTER — Other Ambulatory Visit (INDEPENDENT_AMBULATORY_CARE_PROVIDER_SITE_OTHER): Payer: Medicare HMO

## 2023-03-31 ENCOUNTER — Other Ambulatory Visit: Payer: Self-pay

## 2023-03-31 ENCOUNTER — Telehealth: Payer: Self-pay | Admitting: Internal Medicine

## 2023-03-31 ENCOUNTER — Telehealth: Payer: Self-pay

## 2023-03-31 ENCOUNTER — Encounter (HOSPITAL_COMMUNITY): Payer: Self-pay | Admitting: *Deleted

## 2023-03-31 ENCOUNTER — Ambulatory Visit
Admission: RE | Admit: 2023-03-31 | Discharge: 2023-03-31 | Disposition: A | Discharge status: Home / Self Care | Payer: Medicare HMO | Source: Ambulatory Visit | Attending: Internal Medicine | Admitting: Internal Medicine

## 2023-03-31 ENCOUNTER — Observation Stay (HOSPITAL_COMMUNITY)
Admission: EM | Admit: 2023-03-31 | Discharge: 2023-04-01 | Disposition: A | Discharge status: Home / Self Care | Payer: Medicare HMO | Attending: Emergency Medicine | Admitting: Emergency Medicine

## 2023-03-31 DIAGNOSIS — Z7901 Long term (current) use of anticoagulants: Secondary | ICD-10-CM | POA: Insufficient documentation

## 2023-03-31 DIAGNOSIS — M545 Low back pain, unspecified: Secondary | ICD-10-CM | POA: Insufficient documentation

## 2023-03-31 DIAGNOSIS — Z79899 Other long term (current) drug therapy: Secondary | ICD-10-CM | POA: Insufficient documentation

## 2023-03-31 DIAGNOSIS — G8929 Other chronic pain: Secondary | ICD-10-CM | POA: Diagnosis not present

## 2023-03-31 DIAGNOSIS — Z87891 Personal history of nicotine dependence: Secondary | ICD-10-CM | POA: Insufficient documentation

## 2023-03-31 DIAGNOSIS — I10 Essential (primary) hypertension: Secondary | ICD-10-CM

## 2023-03-31 DIAGNOSIS — I4891 Unspecified atrial fibrillation: Secondary | ICD-10-CM | POA: Diagnosis not present

## 2023-03-31 DIAGNOSIS — R7989 Other specified abnormal findings of blood chemistry: Secondary | ICD-10-CM

## 2023-03-31 DIAGNOSIS — I619 Nontraumatic intracerebral hemorrhage, unspecified: Secondary | ICD-10-CM | POA: Diagnosis not present

## 2023-03-31 DIAGNOSIS — I824Z1 Acute embolism and thrombosis of unspecified deep veins of right distal lower extremity: Secondary | ICD-10-CM | POA: Insufficient documentation

## 2023-03-31 DIAGNOSIS — I2699 Other pulmonary embolism without acute cor pulmonale: Secondary | ICD-10-CM | POA: Insufficient documentation

## 2023-03-31 DIAGNOSIS — I5A Non-ischemic myocardial injury (non-traumatic): Secondary | ICD-10-CM | POA: Insufficient documentation

## 2023-03-31 DIAGNOSIS — I700 Atherosclerosis of aorta: Secondary | ICD-10-CM | POA: Diagnosis not present

## 2023-03-31 DIAGNOSIS — I480 Paroxysmal atrial fibrillation: Secondary | ICD-10-CM

## 2023-03-31 DIAGNOSIS — I611 Nontraumatic intracerebral hemorrhage in hemisphere, cortical: Secondary | ICD-10-CM | POA: Diagnosis not present

## 2023-03-31 LAB — CBC
HCT: 38.5 % — ABNORMAL LOW (ref 39.0–52.0)
Hemoglobin: 12.8 g/dL — ABNORMAL LOW (ref 13.0–17.0)
MCH: 30.3 pg (ref 26.0–34.0)
MCHC: 33.2 g/dL (ref 30.0–36.0)
MCV: 91 fL (ref 80.0–100.0)
Platelets: 353 10*3/uL (ref 150–400)
RBC: 4.23 MIL/uL (ref 4.22–5.81)
RDW: 12.2 % (ref 11.5–15.5)
WBC: 7.2 10*3/uL (ref 4.0–10.5)
nRBC: 0 % (ref 0.0–0.2)

## 2023-03-31 LAB — CBC WITH DIFFERENTIAL/PLATELET
Basophils Absolute: 0 10*3/uL (ref 0.0–0.1)
Basophils Relative: 0.6 % (ref 0.0–3.0)
Eosinophils Absolute: 0.1 10*3/uL (ref 0.0–0.7)
Eosinophils Relative: 1.4 % (ref 0.0–5.0)
HCT: 42.3 % (ref 39.0–52.0)
Hemoglobin: 14 g/dL (ref 13.0–17.0)
Lymphocytes Relative: 16.4 % (ref 12.0–46.0)
Lymphs Abs: 1.2 10*3/uL (ref 0.7–4.0)
MCHC: 33 g/dL (ref 30.0–36.0)
MCV: 92.2 fl (ref 78.0–100.0)
Monocytes Absolute: 0.4 10*3/uL (ref 0.1–1.0)
Monocytes Relative: 5.3 % (ref 3.0–12.0)
Neutro Abs: 5.8 10*3/uL (ref 1.4–7.7)
Neutrophils Relative %: 76.3 % (ref 43.0–77.0)
Platelets: 373 10*3/uL (ref 150.0–400.0)
RBC: 4.59 Mil/uL (ref 4.22–5.81)
RDW: 13 % (ref 11.5–15.5)
WBC: 7.6 10*3/uL (ref 4.0–10.5)

## 2023-03-31 LAB — BASIC METABOLIC PANEL
Anion gap: 9 (ref 5–15)
BUN: 10 mg/dL (ref 6–23)
BUN: 10 mg/dL (ref 8–23)
CO2: 30 mEq/L (ref 19–32)
CO2: 26 mmol/L (ref 22–32)
Calcium: 9.6 mg/dL (ref 8.4–10.5)
Calcium: 9.2 mg/dL (ref 8.9–10.3)
Chloride: 101 mEq/L (ref 96–112)
Chloride: 102 mmol/L (ref 98–111)
Creatinine, Ser: 0.84 mg/dL (ref 0.40–1.50)
Creatinine, Ser: 0.89 mg/dL (ref 0.61–1.24)
GFR, Estimated: >60 mL/min (ref >60)
GFR: 86.99 mL/min (ref >60.00)
Glucose, Bld: 99 mg/dL (ref 70–99)
Glucose, Bld: 95 mg/dL (ref 70–99)
Potassium: 4.2 mEq/L (ref 3.5–5.1)
Potassium: 4 mmol/L (ref 3.5–5.1)
Sodium: 138 mEq/L (ref 135–145)
Sodium: 137 mmol/L (ref 135–145)

## 2023-03-31 LAB — TROPONIN I (HIGH SENSITIVITY)
Troponin I (High Sensitivity): 31 ng/L — ABNORMAL HIGH (ref <18)
Troponin I (High Sensitivity): 37 ng/L — ABNORMAL HIGH (ref <18)

## 2023-03-31 LAB — PROTIME-INR
INR: 1.9 — ABNORMAL HIGH (ref 0.8–1.2)
Prothrombin Time: 22.1 seconds — ABNORMAL HIGH (ref 11.4–15.2)

## 2023-03-31 LAB — MRSA NEXT GEN BY PCR, NASAL: MRSA by PCR Next Gen: NOT DETECTED

## 2023-03-31 MED ORDER — HEPARIN (PORCINE) 25000 UT/250ML-% IV SOLN
1350.0000 [IU]/h | INTRAVENOUS | Status: DC
  Administered 2023-03-31: 1350 [IU]/h via INTRAVENOUS
  Filled 2023-03-31: qty 250

## 2023-03-31 MED ORDER — RIVAROXABAN 20 MG PO TABS
20.0000 mg | ORAL_TABLET | Freq: Every day | ORAL | 0 refills | Status: DC
Start: 2023-03-31 — End: 2023-04-01

## 2023-03-31 MED ORDER — PANTOPRAZOLE SODIUM 40 MG PO TBEC
80.0000 mg | DELAYED_RELEASE_TABLET | Freq: Every day | ORAL | Status: DC
  Administered 2023-04-01: 80 mg via ORAL
  Filled 2023-03-31: qty 2

## 2023-03-31 MED ORDER — IOPAMIDOL (ISOVUE-370) INJECTION 76%
100.0000 mL | Freq: Once | INTRAVENOUS | Status: AC | PRN
  Administered 2023-03-31: 75 mL via INTRAVENOUS

## 2023-03-31 MED ORDER — ALPRAZOLAM 0.25 MG PO TABS: 0.2500 mg | ORAL_TABLET | Freq: Two times a day (BID) | ORAL | Status: DC | PRN

## 2023-03-31 MED ORDER — HYDROCODONE-ACETAMINOPHEN 10-325 MG PO TABS
1.0000 | ORAL_TABLET | Freq: Three times a day (TID) | ORAL | Status: DC | PRN
  Administered 2023-04-01: 1 via ORAL
  Filled 2023-03-31: qty 1

## 2023-03-31 MED ORDER — DILTIAZEM HCL ER COATED BEADS 120 MG PO CP24
120.0000 mg | ORAL_CAPSULE | Freq: Every day | ORAL | Status: DC
  Administered 2023-04-01: 120 mg via ORAL
  Filled 2023-03-31: qty 1

## 2023-03-31 NOTE — Assessment & Plan Note (Signed)
-  elevated. Continue diltiazem

## 2023-03-31 NOTE — ED Notes (Signed)
ED TO INPATIENT HANDOFF REPORT  ED Nurse Name and Phone #: Darral Dash RN [phone]  S Name/Age/Gender Mark Cohen 72 y.o. male Room/Bed: 035C/035C  Code Status   Code Status: Not on file  Home/SNF/Other Home Patient oriented to: self, place, time, and situation Is this baseline? Yes   Triage Complete: Triage complete  Chief Complaint Acute pulmonary embolism (HCC) [I26.99]  Triage Note The pt was sent here for a pe by c-t scan  he was sent there by his doctor who sent him here to be admitted no previous history   Allergies No Known Allergies  Level of Care/Admitting Diagnosis ED Disposition     ED Disposition  Admit   Condition  --   Comment  Hospital Area: [facility] [[id number]]  Level of Care: Telemetry Cardiac [[id number]]  May place patient in observation at [facility] or [facility] if equivalent level of care is available:: No  Covid Evaluation: Asymptomatic - no recent exposure (last 10 days) testing not required  Diagnosis: Acute pulmonary embolism [facility]) [[id number]]  Admitting Physician: Anselm Jungling [[id number]]  Attending Physician: Anselm Jungling [[id number]]          B Medical/Surgery History Past Medical History:  Diagnosis Date   Allergic rhinitis    Colon polyps    Epididymal cyst    right   GERD (gastroesophageal reflux disease)    Hepatitis 1961   hepatitis a as child, no liver oproblems since   Hip pain    both   Past Surgical History:  Procedure Laterality Date   APPENDECTOMY     colonscopy  07/27/2016   EPIDIDYMECTOMY  08/29/2002   Sees Dr Patsi Sears twice a year left side   EPIDIDYMECTOMY Right 09/09/2016   Procedure: EPIDIDYMECTOMY;  Surgeon: Jethro Bolus, MD;  Location: [facility] ORS;  Service: Urology;  Laterality: Right;   NASAL SINUS SURGERY  2008     A IV Location/Drains/Wounds Patient Lines/Drains/Airways Status     Active Line/Drains/Airways     Name Placement date Placement time Site Days   Peripheral IV 03/31/23  20 G Anterior;Right Forearm 03/31/23  1804  Forearm  less than 1   Peripheral IV 03/31/23 20 G Anterior;Left Forearm 03/31/23  1804  Forearm  less than 1            Intake/Output Last 24 hours No intake or output data in the 24 hours ending 03/31/23 2003  Labs/Imaging Results for orders placed or performed during the hospital encounter of 03/31/23 (from the past 48 hour(s))  Basic metabolic panel     Status: None   Collection Time: 03/31/23  5:40 PM  Result Value Ref Range   Sodium 137 135 - 145 mmol/L   Potassium 4.0 3.5 - 5.1 mmol/L   Chloride 102 98 - 111 mmol/L   CO2 26 22 - 32 mmol/L   Glucose, Bld 95 70 - 99 mg/dL    Comment: Glucose reference range applies only to samples taken after fasting for at least 8 hours.   BUN 10 8 - 23 mg/dL   Creatinine, Ser 2.44 0.61 - 1.24 mg/dL   Calcium 9.2 8.9 - 01.0 mg/dL   GFR, Estimated >27 >25 mL/min    Comment: (NOTE) Calculated using the CKD-EPI Creatinine Equation (2021)    Anion gap 9 5 - 15    Comment: Performed at [facility], [address] [place], [place] [address]  CBC     Status: Abnormal   Collection  Time: 03/31/23  5:40 PM  Result Value Ref Range   WBC 7.2 4.0 - 10.5 K/uL   RBC 4.23 4.22 - 5.81 MIL/uL   Hemoglobin 12.8 (L) 13.0 - 17.0 g/dL   HCT 19.1 (L) 47.8 - 29.5 %   MCV 91.0 80.0 - 100.0 fL   MCH 30.3 26.0 - 34.0 pg   MCHC 33.2 30.0 - 36.0 g/dL   RDW 62.1 30.8 - 65.7 %   Platelets 353 150 - 400 K/uL   nRBC 0.0 0.0 - 0.2 %    Comment: Performed at [facility], [address] [place], [place] [address]  Troponin I (High Sensitivity)     Status: Abnormal   Collection Time: 03/31/23  5:40 PM  Result Value Ref Range   Troponin I (High Sensitivity) 31 (H) <18 ng/L    Comment: (NOTE) Elevated high sensitivity troponin I (hsTnI) values and significant  changes across serial measurements may suggest ACS but many other  chronic and acute conditions are known to elevate hsTnI results.  Refer to the  "Links" section for chest pain algorithms and additional  guidance. Performed at [facility], [address], [place], [place] [address]   Protime-INR (order if Patient is taking Coumadin / Warfarin)     Status: Abnormal   Collection Time: 03/31/23  5:40 PM  Result Value Ref Range   Prothrombin Time 22.1 (H) 11.4 - 15.2 seconds   INR 1.9 (H) 0.8 - 1.2    Comment: (NOTE) INR goal varies based on device and disease states. Performed at [facility], [address], [place], [place] [address]    CT Angio Chest W/Cm &/Or Wo Cm  Result Date: 03/31/2023 CLINICAL DATA:  Chronic shortness of breath EXAM: CT ANGIOGRAPHY CHEST WITH CONTRAST TECHNIQUE: Multidetector CT imaging of the chest was performed using the standard protocol during bolus administration of intravenous contrast. Multiplanar CT image reconstructions and MIPs were obtained to evaluate the vascular anatomy. RADIATION DOSE REDUCTION: This exam was performed according to the departmental dose-optimization program which includes automated exposure control, adjustment of the mA and/or kV according to patient size and/or use of iterative reconstruction technique. CONTRAST:  75mL ISOVUE-370 IOPAMIDOL (ISOVUE-370) INJECTION 76% COMPARISON:  None Available. FINDINGS: Cardiovascular: Pulmonary embolus of the right upper lobe lobar artery and additional sites of pulmonary embolus seen in bilateral subsegmental pulmonary arteries. Borderline elevated RV to LV ratio of 1.07. Normal heart size. Small pericardial effusion. Normal caliber thoracic aorta with mild atherosclerotic disease. Mediastinum/Nodes: No enlarged mediastinal, hilar, or axillary lymph nodes. Thyroid and esophagus are unremarkable. Lungs/Pleura: Central airways are patent. Mild biapical pleural-parenchymal scarring. No consolidation, pleural effusion or pneumothorax. Upper Abdomen: No acute abnormality. Musculoskeletal: No chest wall abnormality. No acute or significant  osseous findings. Review of the MIP images confirms the above findings. IMPRESSION: 1. Pulmonary embolus of the right upper lobe lobar artery and additional sites of pulmonary embolus seen in bilateral subsegmental pulmonary arteries. 2. Borderline elevated RV to LV ratio of 1.07, suggestive of right heart strain. Correlate with echocardiography. 3. Small pericardial effusion. 4. Mild aortic Atherosclerosis (ICD10-I70.0). Critical Value/emergent results were called by telephone at the time of interpretation on 03/31/2023 at 3:11 pm to clinical nurse supervisor Gordy Levan, who verbally acknowledged these results. Electronically Signed   By: Allegra Lai M.D.   On: 03/31/2023 15:12    Pending Labs [employer] (From admission, onward)     Start     Ordered  04/02/23 0500  Heparin level (unfractionated)  Daily,   R      03/31/23 1801   04/02/23 0500  APTT  Daily,   R      03/31/23 1801   04/01/23 0200  Heparin level (unfractionated)  Once-Timed,   URGENT        03/31/23 1801   04/01/23 0200  APTT  Once-Timed,   STAT        03/31/23 1801            Vitals/Pain Today's Vitals   03/31/23 1900 03/31/23 1930 03/31/23 1945 03/31/23 2000  BP: 137/85 (!) 139/90 136/87 (!) 156/99  Pulse: 65 69 70 83  Resp: 12 18 17 20   Temp:      SpO2: 98% 97% 97% 100%  Weight:      Height:      PainSc:        Isolation Precautions No active isolations  Medications Medications  heparin ADULT infusion 100 units/mL (25000 units/236mL) (1,350 Units/hr Intravenous New Bag/Given 03/31/23 1824)    Mobility walks     Focused Assessments Cardiac Assessment Handoff:  Cardiac Rhythm: Normal sinus rhythm Lab Results  Component Value Date   CKTOTAL 175 07/22/2020   Lab Results  Component Value Date   DDIMER 2.68 (H) 03/29/2023   Does the Patient currently have chest pain? No    R Recommendations: See Admitting Provider Note  Report given to:   Additional Notes: Pt A/Ox4,VSS, pt  on heparin going at 13.84ml/hr, tolerating well, family with him at bedside.

## 2023-03-31 NOTE — Assessment & Plan Note (Addendum)
-  pulmonary embolus of the right upper lobe lobar artery and additional sites of pulmonary embolus seen in bilateral subsegmental pulmonary arteries. Borderline elevated RV to LV ratio.  -questionable whether this is provoked or unprovoked. He traveled to [facility] and was active with walking miles while he were there. He did develop bilateral lower ankle edema a few days later but it would be atypical to develop bilateral DVT. Edema was dependent and seems more related to him doing a lot of walking on his trip.  - He has this new left lumbar paraspinal pain but worse on waking and after a day of physical work which is more convincing for MSK cause rather than any occult malignancy. No weight loss either and no other concerning symptoms. May need possible abdomen imaging.  -will obtain bilateral venous doppler -obtain echocardiogram

## 2023-03-31 NOTE — Progress Notes (Signed)
ANTICOAGULATION CONSULT NOTE - Initial Consult  Pharmacy Consult for Heparin Indication: pulmonary embolus  No Known Allergies  Patient Measurements: Height: 6' (182.9 cm) Weight: 74.8 kg (164 lb 14.5 oz) IBW/kg (Calculated) : 77.6 Heparin Dosing Weight: 74.8 kg  Vital Signs: Temp: 98.1 F (36.7 C) (09/20 1711) BP: 141/90 (09/20 1711) Pulse Rate: 78 (09/20 1711)  Labs: Recent Labs    03/31/23 1050  HGB 14.0  HCT 42.3  PLT 373.0  CREATININE 0.84    Estimated Creatinine Clearance: 84.1 mL/min (by C-G formula based on SCr of 0.84 mg/dL).   Medical History: Past Medical History:  Diagnosis Date   Allergic rhinitis    Colon polyps    Epididymal cyst    right   GERD (gastroesophageal reflux disease)    Hepatitis 1961   hepatitis a as child, no liver oproblems since   Hip pain    both    Medications:  (Not in a hospital admission)  Scheduled:  Infusions:  PRN:   Assessment: 71 yom with a history of HTN, dry mouth, AF w/ RVR, GAD. Heparin per pharmacy consult placed for pulmonary embolus.  CTA PE w/ bilateral PE w/ RHS  Patient actually started Xarelto today what was prescribed for AF. Took 20mg  at noon today prior to arrival. Will require aPTT monitoring due to likely falsely high anti-Xa level secondary to DOAC use.  Hgb 14; plt 373  Goal of Therapy:  Heparin level 0.3-0.7 units/ml aPTT 66-102 seconds Monitor platelets by anticoagulation protocol: Yes   Plan:  No initial heparin bolus Start heparin infusion at 1350 units/hr --will start now per EDP request Check aPTT & anti-Xa level at 0200 and daily while on heparin Continue to monitor via aPTT until levels are correlated Continue to monitor H&H and platelets  Delmar Landau, PharmD, BCPS 03/31/2023 5:54 PM ED Clinical Pharmacist -  [phone]

## 2023-03-31 NOTE — Telephone Encounter (Signed)
No auth required per Medicare [employer] or [employer]  I attempted to call Mr Brehmer this afternoon and got his voicemail.   I called and left him a detailed message making him aware that he can call and schedule his Echo today before 6pm  Or first thing Monday morning.  Centralized Scheduling [phone] / [phone]  I also reached out to [place] Dom with [facility] Echo dept to put the order on her radar for scheduling, if she is able to reach the patient since I was not able to.

## 2023-03-31 NOTE — Telephone Encounter (Signed)
Pt would like to follow up with the STAT imaging. He would like to have it scheduling and done today if possible. Please advise.

## 2023-03-31 NOTE — ED Provider Notes (Signed)
[place] [facility]GENC[facility] DEPARTMENT AT [facility] Provider Note   CSN: [id number] Arrival date & time: 03/31/23  1651     History  Chief Complaint  Patient presents with   pulmonary embolus    ORI BOLEJACK is a 72 y.o. male history of chronic pain, hypertension, arthritis here presenting with shortness of breath and positive PE on the CT scan.  Patient went to see his doctor 2 days ago.  Patient had some back pain so MRI was ordered.  Patient also was complaining some shortness of breath so D-dimer was ordered.  Patient D-dimer was positive and CTA was done today that showed subsegmental PE with possible heart strain.  Patient states that he gets short of breath with exertion but not at rest.  Patient states that he did go to [place] several weeks ago and came back late August.  Patient has no history of PEs but does have some leg pain.  Patient was noted to be in A-fib during the doctor's visit and had his first dose of Xarelto.  HPI     Home Medications Prior to Admission medications   Medication Sig Start Date End Date Taking? Authorizing Provider  ALPRAZolam (XANAX) 0.25 MG tablet Take 1 tablet (0.25 mg total) by mouth 2 (two) times daily as needed. 03/30/23   Etta Grandchild, MD  cholecalciferol (VITAMIN D) 1000 UNITS tablet Take 5,000 Units by mouth daily.    [provider]  diltiazem (CARDIZEM CD) 120 MG 24 hr capsule Take 1 capsule (120 mg total) by mouth daily. 03/29/23   Etta Grandchild, MD  esomeprazole (NEXIUM) 40 MG capsule TAKE 1 CAPSULE BY MOUTH DAILY 10/02/22   Etta Grandchild, MD  HYDROcodone-acetaminophen (NORCO) 10-325 MG tablet Take 1 tablet by mouth every 8 (eight) hours. 03/30/23   Etta Grandchild, MD  rivaroxaban (XARELTO) 20 MG TABS tablet Take 1 tablet (20 mg total) by mouth daily with supper. 03/31/23   Etta Grandchild, MD      Allergies    Patient has no known allergies.    Review of Systems   Review of Systems  Respiratory:  Positive  for shortness of breath.   All other systems reviewed and are negative.   Physical Exam Updated Vital Signs BP (!) 141/90 (BP Location: Right Arm)   Pulse 78   Temp 98.1 F (36.7 C)   Resp 20   Ht 6' (1.829 m)   Wt 74.8 kg   SpO2 100%   BMI 22.37 kg/m  Physical Exam Vitals and nursing note reviewed.  Constitutional:      Appearance: Normal appearance.  HENT:     Head: Normocephalic.     Nose: Nose normal.     Mouth/Throat:     Mouth: Mucous membranes are moist.  Eyes:     Extraocular Movements: Extraocular movements intact.     Pupils: Pupils are equal, round, and reactive to light.  Cardiovascular:     Rate and Rhythm: Normal rate and regular rhythm.     Pulses: Normal pulses.     Heart sounds: Normal heart sounds.  Pulmonary:     Effort: Pulmonary effort is normal.     Breath sounds: Normal breath sounds.  Abdominal:     General: Abdomen is flat.     Palpations: Abdomen is soft.  Musculoskeletal:        General: Normal range of motion.     Cervical back: Normal range of motion and neck supple.  Comments: Mild bilateral calf tenderness   Skin:    General: Skin is warm.     Capillary Refill: Capillary refill takes less than 2 seconds.  Neurological:     General: No focal deficit present.     Mental Status: He is alert and oriented to person, place, and time.  Psychiatric:        Mood and Affect: Mood normal.        Behavior: Behavior normal.     ED Results / Procedures / Treatments   Labs (all labs ordered are listed, but only abnormal results are displayed) Labs Reviewed  BASIC METABOLIC PANEL  CBC  PROTIME-INR  HEPARIN LEVEL (UNFRACTIONATED)  APTT  TROPONIN I (HIGH SENSITIVITY)    EKG EKG Interpretation Date/Time:  Friday March 31 2023 17:47:22 EDT Ventricular Rate:  70 PR Interval:  177 QRS Duration:  74 QT Interval:  395 QTC Calculation: 427 R Axis:   -13  Text Interpretation: Sinus rhythm Abnormal R-wave progression, early  transition No significant change since last tracing Confirmed by Richardean Canal [phone]) on 03/31/2023 5:48:11 PM  Radiology CT Angio Chest W/Cm &/Or Wo Cm  Result Date: 03/31/2023 CLINICAL DATA:  Chronic shortness of breath EXAM: CT ANGIOGRAPHY CHEST WITH CONTRAST TECHNIQUE: Multidetector CT imaging of the chest was performed using the standard protocol during bolus administration of intravenous contrast. Multiplanar CT image reconstructions and MIPs were obtained to evaluate the vascular anatomy. RADIATION DOSE REDUCTION: This exam was performed according to the departmental dose-optimization program which includes automated exposure control, adjustment of the mA and/or kV according to patient size and/or use of iterative reconstruction technique. CONTRAST:  75mL ISOVUE-370 IOPAMIDOL (ISOVUE-370) INJECTION 76% COMPARISON:  None Available. FINDINGS: Cardiovascular: Pulmonary embolus of the right upper lobe lobar artery and additional sites of pulmonary embolus seen in bilateral subsegmental pulmonary arteries. Borderline elevated RV to LV ratio of 1.07. Normal heart size. Small pericardial effusion. Normal caliber thoracic aorta with mild atherosclerotic disease. Mediastinum/Nodes: No enlarged mediastinal, hilar, or axillary lymph nodes. Thyroid and esophagus are unremarkable. Lungs/Pleura: Central airways are patent. Mild biapical pleural-parenchymal scarring. No consolidation, pleural effusion or pneumothorax. Upper Abdomen: No acute abnormality. Musculoskeletal: No chest wall abnormality. No acute or significant osseous findings. Review of the MIP images confirms the above findings. IMPRESSION: 1. Pulmonary embolus of the right upper lobe lobar artery and additional sites of pulmonary embolus seen in bilateral subsegmental pulmonary arteries. 2. Borderline elevated RV to LV ratio of 1.07, suggestive of right heart strain. Correlate with echocardiography. 3. Small pericardial effusion. 4. Mild aortic  Atherosclerosis (ICD10-I70.0). Critical Value/emergent results were called by telephone at the time of interpretation on 03/31/2023 at 3:11 pm to clinical nurse supervisor Gordy Levan, who verbally acknowledged these results. Electronically Signed   By: Allegra Lai M.D.   On: 03/31/2023 15:12    Procedures Procedures    CRITICAL CARE Performed by: Richardean Canal   Total critical care time: 37 minutes  Critical care time was exclusive of separately billable procedures and treating other patients.  Critical care was necessary to treat or prevent imminent or life-threatening deterioration.  Critical care was time spent personally by me on the following activities: development of treatment plan with patient and/or surrogate as well as nursing, discussions with consultants, evaluation of patient's response to treatment, examination of patient, obtaining history from patient or surrogate, ordering and performing treatments and interventions, ordering and review of laboratory studies, ordering and review of radiographic studies, pulse oximetry and re-evaluation of  patient's condition.   Medications Ordered in ED Medications  heparin ADULT infusion 100 units/mL (25000 units/222mL) (has no administration in time range)    ED Course/ Medical Decision Making/ A&P                                 Medical Decision Making AARAV GUIDA is a 72 y.o. male here presenting with shortness of breath.  Patient has subsegmental PE with possible heart strain.  Patient is hemodynamically stable.  He is not hypotensive or hypoxic or even tachycardic.  He started his first dose of Xarelto today.  Plan to start heparin drip with no bolus.  Plan to admit for observation.  6:43 PM I reviewed patient's labs and they were unremarkable.  Hospitalist to admit for PE  Problems Addressed: PE (pulmonary thromboembolism) (HCC): acute illness or injury  Amount and/or Complexity of Data Reviewed Labs:  ordered. Decision-making details documented in ED Course.  Risk Prescription drug management. Decision regarding hospitalization.    Final Clinical Impression(s) / ED Diagnoses Final diagnoses:  None    Rx / DC Orders ED Discharge Orders     None         Charlynne Pander, MD 03/31/23 1844

## 2023-03-31 NOTE — Assessment & Plan Note (Addendum)
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 -recently had MRI sacrum SI joint on 9/12 but radiology read pending

## 2023-03-31 NOTE — H&P (Addendum)
History and Physical    Patient: Mark Cohen WGN:[id number] DOB: [birth year] DOA: 03/31/2023 DOS: the patient was seen and examined on 03/31/2023 PCP: Etta Grandchild, MD  Patient coming from: Home  Chief Complaint:  Chief Complaint  Patient presents with   pulmonary embolus   HPI: Mark Cohen is a 72 y.o. male with medical history significant of chronic pain, HTN, atrial fibrillation on Xarelto who presents due to outpatient finding of pulmonary embolism.   Patient presented to primary care office on 9/18 with complaints of worsening chronic lower back pain and was noted to be in atrial fibrillation with RVR with HR of around 110. He was prescribed Xarelto and was sent for CTA chest as part of his workup. This subsequently demonstrated pulmonary embolus of the right upper lobe lobar artery and additional sites of pulmonary embolus seen in bilateral subsegmental pulmonary arteries. Borderline elevated RV to LV ratio. He took his first dose of Xarelto today.   Patient reports around mid-August he took a trip to [place] but he stopped a few times along the way. Also walked up to 6 miles at a time when he was there. However a few days later he had bilateral lower ankle extremity that improved with feet elevation but still persist today. Also feels there is something different between his right calf/knee area although not painful. He denies shortness of breath but wife has noticed that he had more labored respiration even when he speaks.   He presented to his PCP initially with worsening chronic lower back pain which he has had for at least 10 years. Says there was a tumor of concern on MRI in 2014 that he was later told was normal. Pain is actually usually to bilateral hips and to lower mid-lumbar. However lately this has been to the left paraspinal musculature area. Pain is worse when he first wakes up but improves as he goes about his day. He is very active and does his own yard work and  [employer]. At the end of the day pain is more severe. He normally takes one hydrocodone a day but the past 2-3 weeks has required 2 pills. He was sent for MRI sacrum SI joint on 9/12 but this has not been read. Denies weight loss. Has remote tobacco hx of about 10 years in his teenage years. Occasional alcohol use.   Wife is concern as to why he suddenly has all these new diagnosis since he is otherwise healthy and very physically active.    In the ED, he was afebrile, BP 141/90 on room air.   CBC without leukocytosis. Hgb mildly low at 12.8.   BMP unremarkable.  Review of Systems: As mentioned in the history of present illness. All other systems reviewed and are negative. Past Medical History:  Diagnosis Date   Allergic rhinitis    Colon polyps    Epididymal cyst    right   GERD (gastroesophageal reflux disease)    Hepatitis 1961   hepatitis a as child, no liver oproblems since   Hip pain    both   Past Surgical History:  Procedure Laterality Date   APPENDECTOMY     colonscopy  07/27/2016   EPIDIDYMECTOMY  08/29/2002   Sees Dr Patsi Sears twice a year left side   EPIDIDYMECTOMY Right 09/09/2016   Procedure: EPIDIDYMECTOMY;  Surgeon: Jethro Bolus, MD;  Location: [facility];  Service: Urology;  Laterality: Right;   NASAL SINUS SURGERY  2008   Social History:  reports  that he has quit smoking. His smoking use included cigarettes. He has a 7 pack-year smoking history. He has never been exposed to tobacco smoke. He has never used smokeless tobacco. He reports current alcohol use of about 21.0 standard drinks of alcohol per week. He reports that he does not use drugs.  No Known Allergies  Family History  Problem Relation Age of Onset   Arthritis Unknown    Stroke Unknown    Cancer Neg Hx     Prior to Admission medications   Medication Sig Start Date End Date Taking? Authorizing Provider  ALPRAZolam (XANAX) 0.25 MG tablet Take 1 tablet (0.25 mg total) by mouth 2 (two) times daily as  needed. 03/30/23   Etta Grandchild, MD  cholecalciferol (VITAMIN D) 1000 UNITS tablet Take 5,000 Units by mouth daily.    [provider]  diltiazem (CARDIZEM CD) 120 MG 24 hr capsule Take 1 capsule (120 mg total) by mouth daily. 03/29/23   Etta Grandchild, MD  esomeprazole (NEXIUM) 40 MG capsule TAKE 1 CAPSULE BY MOUTH DAILY 10/02/22   Etta Grandchild, MD  HYDROcodone-acetaminophen (NORCO) 10-325 MG tablet Take 1 tablet by mouth every 8 (eight) hours. 03/30/23   Etta Grandchild, MD  rivaroxaban (XARELTO) 20 MG TABS tablet Take 1 tablet (20 mg total) by mouth daily with supper. 03/31/23   Etta Grandchild, MD    Physical Exam: Vitals:   03/31/23 1930 03/31/23 1945 03/31/23 2000 03/31/23 2048  BP: (!) 139/90 136/87 (!) 156/99 (!) 159/97  Pulse: 69 70 83   Resp: 18 17 20 17   Temp:    98.5 F (36.9 C)  TempSrc:    Oral  SpO2: 97% 97% 100% 98%  Weight:      Height:       Constitutional: NAD, calm, comfortable, well-appearing thin elderly male laying upright in bed Eyes:  lids and conjunctivae normal ENMT: Mucous membranes are moist.  Neck: normal, supple Respiratory: clear to auscultation bilaterally, no wheezing, no crackles. Normal respiratory effort. No accessory muscle use.  Cardiovascular: Regular rate and rhythm, no murmurs / rubs / gallops.  Slight nonpitting edema of the left lower ankle  abdomen: no tenderness, soft, no mass palpated.  Positive bowel sounds.   Back: No obvious deformities or step-off, no pain to palpation of midline spine or paraspinal musculature Musculoskeletal: no clubbing / cyanosis. No joint deformity upper and lower extremities. Good ROM, no contractures. Normal muscle tone.  Skin: no rashes, lesions, ulcers. No induration Neurologic: CN 2-12 grossly intact. Strength 5/5 in all 4.  Psychiatric: Normal judgment and insight. Alert and oriented x 3. Normal mood.   Data Reviewed:  See HPI  Assessment and Plan: * Acute pulmonary embolism  (HCC) -pulmonary embolus of the right upper lobe lobar artery and additional sites of pulmonary embolus seen in bilateral subsegmental pulmonary arteries. Borderline elevated RV to LV ratio.  -questionable whether this is provoked or unprovoked. He traveled to [facility] and was active with walking miles while he were there. He did develop bilateral lower ankle edema a few days later but it would be atypical to develop bilateral DVT. Edema was dependent and seems more related to him doing a lot of walking on his trip.  - He has this new left lumbar paraspinal pain but worse on waking and after a day of physical work which is more convincing for MSK cause rather than any occult malignancy. No weight loss either and no other concerning symptoms.  May need possible abdomen imaging.  -will obtain bilateral venous doppler -obtain echocardiogram  Elevated troponin Troponin of 31-37 -suspect could be due to demand/strain from PE -no cardiac symptoms -echocardiogram pending  Paroxysmal atrial fibrillation (HCC) -newly diagnosed a few days ago at PCP office. Currently in NSR.  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 -recently had MRI sacrum SI joint on 9/12 but radiology read pending  Essential hypertension, benign -elevated. Continue diltiazem       Advance Care Planning:   Code Status: Full Code   Consults: none  Family Communication: wife and cousin at bedside  Severity of Illness: The appropriate patient status for this patient is OBSERVATION. Observation status is judged to be reasonable and necessary in order to provide the required intensity of service to ensure the patient's safety. The patient's presenting symptoms, physical exam  findings, and initial radiographic and laboratory data in the context of their medical condition is felt to place them at decreased risk for further clinical deterioration. Furthermore, it is anticipated that the patient will be medically stable for discharge from the hospital within 2 midnights of admission.   Author: Anselm Jungling, DO 03/31/2023 9:08 PM  For on call review [email or web address].

## 2023-03-31 NOTE — Telephone Encounter (Signed)
Received critical radiology report. Patient has positive CT results for a PE.   Reviewed with DOD, pt new onset of A.Fib and elevated BNP and D-dimer, just started on Xarelto 20mg  today - Recommendation for patient to go to the ED.    Called patient to make aware of results and provider recommendations. Patient was working in yard and seemed sob at time but it got better over the call. Patient was very worried, but was going to call wife to pick him up. Made him aware of what likely would happened and he needed to go to the [facility] ED in case he need to stay overnight for an infusion.  Sent message to patient MyChart so he could have something with him to explain why he was at the ED as he stated his handwriting was so messy cause he was so nervous.  No additional questions at this time.

## 2023-03-31 NOTE — Telephone Encounter (Signed)
Patient received Dr. Yetta Barre' message about needing a stat echocardiogram and CT scan. He would like clarification on next steps. He would like to know if he needs to go to our lab downstairs or if someone is going to contact him about scheduling the CT. Patient would like a call back at [phone].

## 2023-03-31 NOTE — Assessment & Plan Note (Addendum)
Troponin of 31-37 -suspect could be due to demand/strain from PE -no cardiac symptoms -echocardiogram pending

## 2023-03-31 NOTE — ED Triage Notes (Signed)
The pt was sent here for a pe by c-t scan  he was sent there by his doctor who sent him here to be admitted no previous history

## 2023-03-31 NOTE — Assessment & Plan Note (Signed)
-  newly diagnosed a few days ago at PCP office. Currently in NSR.  -Continue diltiazem -CHA2DS2-VASc score of 2 and will need to continue anticoagulation

## 2023-04-01 ENCOUNTER — Observation Stay (HOSPITAL_BASED_OUTPATIENT_CLINIC_OR_DEPARTMENT_OTHER): Payer: Medicare HMO

## 2023-04-01 DIAGNOSIS — I10 Essential (primary) hypertension: Secondary | ICD-10-CM | POA: Diagnosis not present

## 2023-04-01 DIAGNOSIS — R7989 Other specified abnormal findings of blood chemistry: Secondary | ICD-10-CM

## 2023-04-01 DIAGNOSIS — I480 Paroxysmal atrial fibrillation: Secondary | ICD-10-CM | POA: Diagnosis not present

## 2023-04-01 DIAGNOSIS — M545 Low back pain, unspecified: Secondary | ICD-10-CM | POA: Diagnosis not present

## 2023-04-01 DIAGNOSIS — Z86711 Personal history of pulmonary embolism: Secondary | ICD-10-CM | POA: Diagnosis not present

## 2023-04-01 DIAGNOSIS — G8929 Other chronic pain: Secondary | ICD-10-CM

## 2023-04-01 DIAGNOSIS — I2609 Other pulmonary embolism with acute cor pulmonale: Secondary | ICD-10-CM

## 2023-04-01 DIAGNOSIS — I2699 Other pulmonary embolism without acute cor pulmonale: Secondary | ICD-10-CM | POA: Diagnosis not present

## 2023-04-01 LAB — CBC
HCT: 34.8 % — ABNORMAL LOW (ref 39.0–52.0)
Hemoglobin: 11.8 g/dL — ABNORMAL LOW (ref 13.0–17.0)
MCH: 30 pg (ref 26.0–34.0)
MCHC: 33.9 g/dL (ref 30.0–36.0)
MCV: 88.5 fL (ref 80.0–100.0)
Platelets: 290 10*3/uL (ref 150–400)
RBC: 3.93 MIL/uL — ABNORMAL LOW (ref 4.22–5.81)
RDW: 12 % (ref 11.5–15.5)
WBC: 6.3 10*3/uL (ref 4.0–10.5)
nRBC: 0 % (ref 0.0–0.2)

## 2023-04-01 LAB — ECHOCARDIOGRAM COMPLETE
Area-P 1/2: 5.13 cm2
Height: 72 in
MV M vel: 5.18 m/s
MV Peak grad: 107.1 mmHg
S' Lateral: 3.5 cm
Single Plane A4C EF: 51.8 %
Weight: 2603.19 oz

## 2023-04-01 LAB — HEPARIN LEVEL (UNFRACTIONATED): Heparin Unfractionated: >1.1 IU/mL — ABNORMAL HIGH (ref 0.30–0.70)

## 2023-04-01 LAB — APTT: aPTT: 76 seconds — ABNORMAL HIGH (ref 24–36)

## 2023-04-01 MED ORDER — RIVAROXABAN (XARELTO) VTE STARTER PACK (15 & 20 MG): ORAL_TABLET | ORAL | 0 refills | Status: DC

## 2023-04-01 MED ORDER — RIVAROXABAN 2.5 MG PO TABS: ORAL_TABLET | ORAL | 0 refills | Status: DC

## 2023-04-01 MED ORDER — RIVAROXABAN 15 MG PO TABS
15.0000 mg | ORAL_TABLET | Freq: Two times a day (BID) | ORAL | Status: DC
  Administered 2023-04-01: 15 mg via ORAL
  Filled 2023-04-01: qty 1

## 2023-04-01 MED ORDER — RIVAROXABAN 20 MG PO TABS: 20.0000 mg | ORAL_TABLET | Freq: Every day | ORAL | Status: DC

## 2023-04-01 MED ORDER — RIVAROXABAN 15 MG PO TABS: 15.0000 mg | ORAL_TABLET | Freq: Two times a day (BID) | ORAL | 0 refills | Status: DC

## 2023-04-01 MED ORDER — RIVAROXABAN 15 MG PO TABS: 15.0000 mg | ORAL_TABLET | Freq: Two times a day (BID) | ORAL | Status: DC

## 2023-04-01 NOTE — Progress Notes (Addendum)
ANTICOAGULATION CONSULT NOTE - Initial Consult  Pharmacy Consult for Heparin Indication: pulmonary embolus  No Known Allergies  Patient Measurements: Height: 6' (182.9 cm) Weight: 74.8 kg (164 lb 14.5 oz) IBW/kg (Calculated) : 77.6 Heparin Dosing Weight: 74.8 kg  Vital Signs: Temp: 98.4 F (36.9 C) (09/21 0033) Temp Source: Oral (09/21 0033) BP: 123/80 (09/21 0033) Pulse Rate: 70 (09/21 0033)  Labs: Recent Labs    03/31/23 1050 03/31/23 1740 03/31/23 1933 04/01/23 0201  HGB 14.0 12.8*  --  11.8*  HCT 42.3 38.5*  --  34.8*  PLT 373.0 353  --  290  APTT  --   --   --  76*  LABPROT  --  22.1*  --   --   INR  --  1.9*  --   --   HEPARINUNFRC  --   --   --  >1.10*  CREATININE 0.84 0.89  --   --   TROPONINIHS  --  31* 37*  --     Estimated Creatinine Clearance: 79.4 mL/min (by C-G formula based on SCr of 0.89 mg/dL).   Assessment: 71 yom with a history of HTN, dry mouth, AF w/ RVR, GAD. Heparin per pharmacy consult placed for pulmonary embolus. CTA PE w/ bilateral PE w/ RHS  Patient actually started Xarelto today what was prescribed for AF. Took 20mg  at noon 9/20 prior to arrival. Will require aPTT monitoring due to likely falsely high anti-Xa level secondary to DOAC use.  Hgb 14; plt 373  aPTT 76 - therapeutic  HL >1.10 - falsely elevated with recent DOAC administration  Goal of Therapy:  Heparin level 0.3-0.7 units/ml aPTT 66-102 seconds Monitor platelets by anticoagulation protocol: Yes   Plan:  Continue heparin infusion at 1350 units/hr Check aPTT & anti-Xa level daily while on heparin, check confirmatory aPTT at 1100 Continue to monitor via aPTT until levels are correlated Continue to monitor H&H and platelets F/u enteral anticoag as appropriate  Calton Dach, PharmD, BCCCP Clinical Pharmacist 04/01/2023 2:55 AM

## 2023-04-01 NOTE — Care Management (Signed)
Spoke w patient and wife at bedside. Provided with 30 day xaralto card. Patient states he just filled Rx for it, and we discussed that he could use the coupon card for his next refill.

## 2023-04-01 NOTE — Care Management Obs Status (Signed)
MEDICARE OBSERVATION STATUS NOTIFICATION   Patient Details  Name: Mark Cohen MRN: [id number] Date of Birth: Oct 06, 1950   Medicare Observation Status Notification Given:  Yes    Lawerance Sabal, RN 04/01/2023, 3:42 PM

## 2023-04-01 NOTE — Discharge Summary (Signed)
Follow-up Information     Etta Grandchild, MD. Schedule an appointment as soon as possible for a visit in 1 week(s).   Specialty: Internal Medicine Contact information: [address] [place] [address] [phone]                 Discharge Instructions     Diet - low sodium heart healthy   Complete by: As directed    Discharge instructions   Complete by: As directed    **IMPORTANT DISCHARGE INSTRUCTIONS**   From Dr. Maryfrances Bunnell: You were admitted for a blood clot in the chest  Here   Discharge instructions   Complete by: As directed    **IMPORTANT DISCHARGE INSTRUCTIONS**   From Dr. Maryfrances Bunnell: You were admitted for a blood clot in the chest. We did an ultrasound and found  there were remnants also in the legs  Take Xarelto for this The dosing is slightly different than what you have Take Xarelto 15 mg twice daily for 21 days as a loading period Then switch to 20 mg nightly  Take the first dose of home Xarelto tonight at 10pm  For the Afib, continue your diltiazem  Go see Cardiology Dr. Anne Fu in Nov 26 as scheduled  Go see Dr. Yetta Barre in 1 week for a hospital follow up  Resume your other home medicines (we gave you your diltiazem and Nexium equivalent here)   Increase activity slowly   Complete by: As directed    Increase activity slowly   Complete by: As directed        Discharge Exam: Filed Weights   03/31/23 1728 04/01/23 0439  Weight: 74.8 kg 73.8 kg    General: Pt is alert, awake, not in acute distress Cardiovascular: RRR, nl S1-S2, no murmurs appreciated.   No LE edema.   Respiratory: Normal respiratory rate and rhythm.  CTAB without rales or wheezes. Abdominal: Abdomen soft and non-tender.  No distension or HSM.   Neuro/Psych: Strength symmetric in upper and lower extremities.  Judgment and insight appear normal.   Condition at discharge: good  The results of significant diagnostics from this hospitalization (including imaging, microbiology, ancillary and laboratory) are listed below for reference.   Imaging Studies: ECHOCARDIOGRAM COMPLETE  Result Date: 04/01/2023    ECHOCARDIOGRAM REPORT   Patient Name:   Mark Cohen Date of Exam: 04/01/2023 Medical Rec #:  [id number]         Height:       72.0 in Accession #:    [id number]        Weight:       162.7 lb Date of Birth:  April 22, 1951        BSA:          1.952 m Patient Age:    72 years          BP:           140/93 mmHg Patient Gender: M                 HR:           75 bpm. Exam Location:  Inpatient Procedure: 2D Echo, Cardiac Doppler and Color Doppler Indications:    Pulmonary Embolus I26.09  History:        Patient has no prior history of Echocardiogram examinations.                  Arrythmias:Atrial Fibrillation; Risk Factors:Hypertension and  Follow-up Information     Etta Grandchild, MD. Schedule an appointment as soon as possible for a visit in 1 week(s).   Specialty: Internal Medicine Contact information: [address] [place] [address] [phone]                 Discharge Instructions     Diet - low sodium heart healthy   Complete by: As directed    Discharge instructions   Complete by: As directed    **IMPORTANT DISCHARGE INSTRUCTIONS**   From Dr. Maryfrances Bunnell: You were admitted for a blood clot in the chest  Here   Discharge instructions   Complete by: As directed    **IMPORTANT DISCHARGE INSTRUCTIONS**   From Dr. Maryfrances Bunnell: You were admitted for a blood clot in the chest. We did an ultrasound and found  there were remnants also in the legs  Take Xarelto for this The dosing is slightly different than what you have Take Xarelto 15 mg twice daily for 21 days as a loading period Then switch to 20 mg nightly  Take the first dose of home Xarelto tonight at 10pm  For the Afib, continue your diltiazem  Go see Cardiology Dr. Anne Fu in Nov 26 as scheduled  Go see Dr. Yetta Barre in 1 week for a hospital follow up  Resume your other home medicines (we gave you your diltiazem and Nexium equivalent here)   Increase activity slowly   Complete by: As directed    Increase activity slowly   Complete by: As directed        Discharge Exam: Filed Weights   03/31/23 1728 04/01/23 0439  Weight: 74.8 kg 73.8 kg    General: Pt is alert, awake, not in acute distress Cardiovascular: RRR, nl S1-S2, no murmurs appreciated.   No LE edema.   Respiratory: Normal respiratory rate and rhythm.  CTAB without rales or wheezes. Abdominal: Abdomen soft and non-tender.  No distension or HSM.   Neuro/Psych: Strength symmetric in upper and lower extremities.  Judgment and insight appear normal.   Condition at discharge: good  The results of significant diagnostics from this hospitalization (including imaging, microbiology, ancillary and laboratory) are listed below for reference.   Imaging Studies: ECHOCARDIOGRAM COMPLETE  Result Date: 04/01/2023    ECHOCARDIOGRAM REPORT   Patient Name:   Mark Cohen Date of Exam: 04/01/2023 Medical Rec #:  [id number]         Height:       72.0 in Accession #:    [id number]        Weight:       162.7 lb Date of Birth:  April 22, 1951        BSA:          1.952 m Patient Age:    72 years          BP:           140/93 mmHg Patient Gender: M                 HR:           75 bpm. Exam Location:  Inpatient Procedure: 2D Echo, Cardiac Doppler and Color Doppler Indications:    Pulmonary Embolus I26.09  History:        Patient has no prior history of Echocardiogram examinations.                  Arrythmias:Atrial Fibrillation; Risk Factors:Hypertension and  Follow-up Information     Etta Grandchild, MD. Schedule an appointment as soon as possible for a visit in 1 week(s).   Specialty: Internal Medicine Contact information: [address] [place] [address] [phone]                 Discharge Instructions     Diet - low sodium heart healthy   Complete by: As directed    Discharge instructions   Complete by: As directed    **IMPORTANT DISCHARGE INSTRUCTIONS**   From Dr. Maryfrances Bunnell: You were admitted for a blood clot in the chest  Here   Discharge instructions   Complete by: As directed    **IMPORTANT DISCHARGE INSTRUCTIONS**   From Dr. Maryfrances Bunnell: You were admitted for a blood clot in the chest. We did an ultrasound and found  there were remnants also in the legs  Take Xarelto for this The dosing is slightly different than what you have Take Xarelto 15 mg twice daily for 21 days as a loading period Then switch to 20 mg nightly  Take the first dose of home Xarelto tonight at 10pm  For the Afib, continue your diltiazem  Go see Cardiology Dr. Anne Fu in Nov 26 as scheduled  Go see Dr. Yetta Barre in 1 week for a hospital follow up  Resume your other home medicines (we gave you your diltiazem and Nexium equivalent here)   Increase activity slowly   Complete by: As directed    Increase activity slowly   Complete by: As directed        Discharge Exam: Filed Weights   03/31/23 1728 04/01/23 0439  Weight: 74.8 kg 73.8 kg    General: Pt is alert, awake, not in acute distress Cardiovascular: RRR, nl S1-S2, no murmurs appreciated.   No LE edema.   Respiratory: Normal respiratory rate and rhythm.  CTAB without rales or wheezes. Abdominal: Abdomen soft and non-tender.  No distension or HSM.   Neuro/Psych: Strength symmetric in upper and lower extremities.  Judgment and insight appear normal.   Condition at discharge: good  The results of significant diagnostics from this hospitalization (including imaging, microbiology, ancillary and laboratory) are listed below for reference.   Imaging Studies: ECHOCARDIOGRAM COMPLETE  Result Date: 04/01/2023    ECHOCARDIOGRAM REPORT   Patient Name:   Mark Cohen Date of Exam: 04/01/2023 Medical Rec #:  [id number]         Height:       72.0 in Accession #:    [id number]        Weight:       162.7 lb Date of Birth:  April 22, 1951        BSA:          1.952 m Patient Age:    72 years          BP:           140/93 mmHg Patient Gender: M                 HR:           75 bpm. Exam Location:  Inpatient Procedure: 2D Echo, Cardiac Doppler and Color Doppler Indications:    Pulmonary Embolus I26.09  History:        Patient has no prior history of Echocardiogram examinations.                  Arrythmias:Atrial Fibrillation; Risk Factors:Hypertension and  Physician Discharge Summary   Patient: Mark Cohen MRN: [id number] DOB: June 25, 1951  Admit date:     03/31/2023  Discharge date: 04/01/23  Discharge Physician: Alberteen Sam   PCP: Etta Grandchild, MD     Recommendations at discharge:  Follow up with PCP for new PE, DVTs Follow up with Cardiology Dr. 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 Minzey is a 71 y.o. M with no significant PMHx who developed diffuse myalgias for several days, followed by bilateral hand swelling and small joint pain in the hands for several days, followed by ankle swelling.  Evaluated by PCP who noted new onset atrial fibrillation.  D-dimer obtained due to ankle swelling was elevated and follow up CTA chest showed small PE, so he was sent to the ER.   * Acute pulmonary embolism (HCC) Right lower extremity distal DVT Patient was admitted overnight.  He was hemodynamically stable, on room air and asymptomatic.  Echocardiogram showed normal EF and no RH strain.  He was advanced to VTE dosing of Xarelto and discharged with PCP follow up.      Paroxysmal atrial fibrillation (HCC) New onset in the setting of PE.  Was in sinus here.  TSH normal.  Echo with normal EF, normal valves.  CHA2DS2-Vasc 1 due to age.   Continue Xarelto and diltiazem.  Agree with Cardiology follow up.     Chart history of essential hypertension Long-standing chart history    Hand swelling and myalgias These were brief and I presume viral in nature.  Defer further work up to Dr. Yetta Barre.          The [facility] Controlled Substances Registry was reviewed for this patient prior to discharge.    Consultants: None Procedures performed: Echo   Disposition: Home Diet recommendation:  Discharge Diet Orders (From admission, onward)     Start     Ordered   04/01/23 0000  Diet - low sodium heart healthy        04/01/23 1559             DISCHARGE MEDICATION: Allergies as of 04/01/2023   No Known Allergies      Medication List     TAKE these medications    ALPRAZolam 0.25 MG tablet Commonly known as: XANAX Take 1 tablet (0.25 mg total) by mouth 2 (two) times daily as needed. What changed: reasons to take this   DIALYVITE VITAMIN D 5000 PO Take 1 tablet by mouth daily.   diltiazem 120 MG 24 hr capsule Commonly known as: Cardizem CD Take 1 capsule (120 mg total) by mouth daily.   esomeprazole 40 MG capsule Commonly known as: NEXIUM TAKE 1 CAPSULE BY MOUTH DAILY   HYDROcodone-acetaminophen 10-325 MG tablet Commonly known as: NORCO Take 1 tablet by mouth every 8 (eight) hours. What changed:  when to take this reasons to take this   Rivaroxaban 15 MG Tabs tablet Commonly known as: XARELTO Take 1 tablet (15 mg total) by mouth 2 (two) times daily with a meal for 21 days. What changed:  medication strength how much to take when to take this  Physician Discharge Summary   Patient: Mark Cohen MRN: [id number] DOB: June 25, 1951  Admit date:     03/31/2023  Discharge date: 04/01/23  Discharge Physician: Alberteen Sam   PCP: Etta Grandchild, MD     Recommendations at discharge:  Follow up with PCP for new PE, DVTs Follow up with Cardiology Dr. 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 Minzey is a 71 y.o. M with no significant PMHx who developed diffuse myalgias for several days, followed by bilateral hand swelling and small joint pain in the hands for several days, followed by ankle swelling.  Evaluated by PCP who noted new onset atrial fibrillation.  D-dimer obtained due to ankle swelling was elevated and follow up CTA chest showed small PE, so he was sent to the ER.   * Acute pulmonary embolism (HCC) Right lower extremity distal DVT Patient was admitted overnight.  He was hemodynamically stable, on room air and asymptomatic.  Echocardiogram showed normal EF and no RH strain.  He was advanced to VTE dosing of Xarelto and discharged with PCP follow up.      Paroxysmal atrial fibrillation (HCC) New onset in the setting of PE.  Was in sinus here.  TSH normal.  Echo with normal EF, normal valves.  CHA2DS2-Vasc 1 due to age.   Continue Xarelto and diltiazem.  Agree with Cardiology follow up.     Chart history of essential hypertension Long-standing chart history    Hand swelling and myalgias These were brief and I presume viral in nature.  Defer further work up to Dr. Yetta Barre.          [facility] Controlled Substances Registry was reviewed for this patient prior to discharge.    Consultants: None Procedures performed: Echo   Disposition: Home Diet recommendation:  Discharge Diet Orders (From admission, onward)     Start     Ordered   04/01/23 0000  Diet - low sodium heart healthy        04/01/23 1559             DISCHARGE MEDICATION: Allergies as of 04/01/2023   No Known Allergies      Medication List     TAKE these medications    ALPRAZolam 0.25 MG tablet Commonly known as: XANAX Take 1 tablet (0.25 mg total) by mouth 2 (two) times daily as needed. What changed: reasons to take this   DIALYVITE VITAMIN D 5000 PO Take 1 tablet by mouth daily.   diltiazem 120 MG 24 hr capsule Commonly known as: Cardizem CD Take 1 capsule (120 mg total) by mouth daily.   esomeprazole 40 MG capsule Commonly known as: NEXIUM TAKE 1 CAPSULE BY MOUTH DAILY   HYDROcodone-acetaminophen 10-325 MG tablet Commonly known as: NORCO Take 1 tablet by mouth every 8 (eight) hours. What changed:  when to take this reasons to take this   Rivaroxaban 15 MG Tabs tablet Commonly known as: XARELTO Take 1 tablet (15 mg total) by mouth 2 (two) times daily with a meal for 21 days. What changed:  medication strength how much to take when to take this  Follow-up Information     Etta Grandchild, MD. Schedule an appointment as soon as possible for a visit in 1 week(s).   Specialty: Internal Medicine Contact information: [address] [place] [address] [phone]                 Discharge Instructions     Diet - low sodium heart healthy   Complete by: As directed    Discharge instructions   Complete by: As directed    **IMPORTANT DISCHARGE INSTRUCTIONS**   From Dr. Maryfrances Bunnell: You were admitted for a blood clot in the chest  Here   Discharge instructions   Complete by: As directed    **IMPORTANT DISCHARGE INSTRUCTIONS**   From Dr. Maryfrances Bunnell: You were admitted for a blood clot in the chest. We did an ultrasound and found  there were remnants also in the legs  Take Xarelto for this The dosing is slightly different than what you have Take Xarelto 15 mg twice daily for 21 days as a loading period Then switch to 20 mg nightly  Take the first dose of home Xarelto tonight at 10pm  For the Afib, continue your diltiazem  Go see Cardiology Dr. Anne Fu in Nov 26 as scheduled  Go see Dr. Yetta Barre in 1 week for a hospital follow up  Resume your other home medicines (we gave you your diltiazem and Nexium equivalent here)   Increase activity slowly   Complete by: As directed    Increase activity slowly   Complete by: As directed        Discharge Exam: Filed Weights   03/31/23 1728 04/01/23 0439  Weight: 74.8 kg 73.8 kg    General: Pt is alert, awake, not in acute distress Cardiovascular: RRR, nl S1-S2, no murmurs appreciated.   No LE edema.   Respiratory: Normal respiratory rate and rhythm.  CTAB without rales or wheezes. Abdominal: Abdomen soft and non-tender.  No distension or HSM.   Neuro/Psych: Strength symmetric in upper and lower extremities.  Judgment and insight appear normal.   Condition at discharge: good  The results of significant diagnostics from this hospitalization (including imaging, microbiology, ancillary and laboratory) are listed below for reference.   Imaging Studies: ECHOCARDIOGRAM COMPLETE  Result Date: 04/01/2023    ECHOCARDIOGRAM REPORT   Patient Name:   Mark Cohen Date of Exam: 04/01/2023 Medical Rec #:  [id number]         Height:       72.0 in Accession #:    [id number]        Weight:       162.7 lb Date of Birth:  April 22, 1951        BSA:          1.952 m Patient Age:    72 years          BP:           140/93 mmHg Patient Gender: M                 HR:           75 bpm. Exam Location:  Inpatient Procedure: 2D Echo, Cardiac Doppler and Color Doppler Indications:    Pulmonary Embolus I26.09  History:        Patient has no prior history of Echocardiogram examinations.                  Arrythmias:Atrial Fibrillation; Risk Factors:Hypertension and  Physician Discharge Summary   Patient: Mark Cohen MRN: [id number] DOB: June 25, 1951  Admit date:     03/31/2023  Discharge date: 04/01/23  Discharge Physician: Alberteen Sam   PCP: Etta Grandchild, MD     Recommendations at discharge:  Follow up with PCP for new PE, DVTs Follow up with Cardiology Dr. 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 Minzey is a 71 y.o. M with no significant PMHx who developed diffuse myalgias for several days, followed by bilateral hand swelling and small joint pain in the hands for several days, followed by ankle swelling.  Evaluated by PCP who noted new onset atrial fibrillation.  D-dimer obtained due to ankle swelling was elevated and follow up CTA chest showed small PE, so he was sent to the ER.   * Acute pulmonary embolism (HCC) Right lower extremity distal DVT Patient was admitted overnight.  He was hemodynamically stable, on room air and asymptomatic.  Echocardiogram showed normal EF and no RH strain.  He was advanced to VTE dosing of Xarelto and discharged with PCP follow up.      Paroxysmal atrial fibrillation (HCC) New onset in the setting of PE.  Was in sinus here.  TSH normal.  Echo with normal EF, normal valves.  CHA2DS2-Vasc 1 due to age.   Continue Xarelto and diltiazem.  Agree with Cardiology follow up.     Chart history of essential hypertension Long-standing chart history    Hand swelling and myalgias These were brief and I presume viral in nature.  Defer further work up to Dr. Yetta Barre.          The [facility] Controlled Substances Registry was reviewed for this patient prior to discharge.    Consultants: None Procedures performed: Echo   Disposition: Home Diet recommendation:  Discharge Diet Orders (From admission, onward)     Start     Ordered   04/01/23 0000  Diet - low sodium heart healthy        04/01/23 1559             DISCHARGE MEDICATION: Allergies as of 04/01/2023   No Known Allergies      Medication List     TAKE these medications    ALPRAZolam 0.25 MG tablet Commonly known as: XANAX Take 1 tablet (0.25 mg total) by mouth 2 (two) times daily as needed. What changed: reasons to take this   DIALYVITE VITAMIN D 5000 PO Take 1 tablet by mouth daily.   diltiazem 120 MG 24 hr capsule Commonly known as: Cardizem CD Take 1 capsule (120 mg total) by mouth daily.   esomeprazole 40 MG capsule Commonly known as: NEXIUM TAKE 1 CAPSULE BY MOUTH DAILY   HYDROcodone-acetaminophen 10-325 MG tablet Commonly known as: NORCO Take 1 tablet by mouth every 8 (eight) hours. What changed:  when to take this reasons to take this   Rivaroxaban 15 MG Tabs tablet Commonly known as: XARELTO Take 1 tablet (15 mg total) by mouth 2 (two) times daily with a meal for 21 days. What changed:  medication strength how much to take when to take this

## 2023-04-01 NOTE — Addendum Note (Signed)
Addended by: Etta Grandchild on: 04/01/2023 10:56 AM   Modules accepted: Orders

## 2023-04-01 NOTE — Progress Notes (Signed)
VASCULAR LAB    Bilateral lower extremity venous duplex has been performed.  See CV proc for preliminary results.  Gave results to Dr. Cristobal Goldmann, [facility], RVT 04/01/2023, 10:57 AM

## 2023-04-01 NOTE — Discharge Instructions (Signed)

## 2023-04-01 NOTE — Progress Notes (Signed)
  Echocardiogram 2D Echocardiogram has been performed.  Maren Reamer 04/01/2023, 10:39 AM

## 2023-04-01 NOTE — Progress Notes (Signed)
O2 assessment completed 98% at resting 98% with exertion at Room Air Pt walked 200 feet walk in hallways without any respiratory distress.

## 2023-04-01 NOTE — Progress Notes (Signed)
ANTICOAGULATION CONSULT NOTE - Follow Up Consult  Pharmacy Consult for Xarelto Indication: pulmonary embolus  No Known Allergies  Patient Measurements: Height: 6' (182.9 cm) Weight: 73.8 kg (162 lb 11.2 oz) IBW/kg (Calculated) : 77.6 Heparin Dosing Weight: 74.8 kg  Vital Signs: Temp: 98.1 F (36.7 C) (09/21 0735) Temp Source: Oral (09/21 0735) BP: 140/93 (09/21 0735) Pulse Rate: Mark (09/21 0735)  Labs: Recent Labs    03/31/23 1050 03/31/23 1740 03/31/23 1933 04/01/23 0201  HGB 14.0 12.8*  --  11.8*  HCT 42.3 38.5*  --  34.8*  PLT 373.0 353  --  290  APTT  --   --   --  76*  LABPROT  --  22.1*  --   --   INR  --  1.9*  --   --   HEPARINUNFRC  --   --   --  >1.10*  CREATININE 0.84 0.89  --   --   TROPONINIHS  --  31* 37*  --     Estimated Creatinine Clearance: 78.3 mL/min (by C-G formula based on SCr of 0.89 mg/dL).   Assessment: Mark Cohen with a history of HTN, dry mouth, AF w/ RVR, GAD. Heparin per pharmacy consult placed for pulmonary embolus. CTA PE w/ bilateral PE w/ RHS  Patient actually started Xarelto today what was prescribed for AF. Took 20mg  at noon 9/20 prior to arrival.   9/21 AM: [name] plan to switch from heparin to Xarelto.   Goal of Therapy:  Monitor platelets by anticoagulation protocol: Yes   Plan:  Discontinue Heparin  Start Xarelto 15mg  BID x 21 days, then 20mg  daily Monitor CBC, signs and symptoms of bleeding   Verdene Rio, PharmD PGY1 Pharmacy Resident

## 2023-04-03 ENCOUNTER — Inpatient Hospital Stay (HOSPITAL_COMMUNITY)
Admission: EM | Admit: 2023-04-03 | Discharge: 2023-04-06 | DRG: 987 | Disposition: A | Discharge status: Home / Self Care | Payer: Medicare HMO | Attending: Neurology | Admitting: Neurology

## 2023-04-03 ENCOUNTER — Encounter (HOSPITAL_COMMUNITY): Payer: Self-pay | Admitting: Emergency Medicine

## 2023-04-03 ENCOUNTER — Emergency Department (HOSPITAL_COMMUNITY): Payer: Medicare HMO

## 2023-04-03 ENCOUNTER — Other Ambulatory Visit: Payer: Self-pay

## 2023-04-03 ENCOUNTER — Telehealth: Payer: Self-pay | Admitting: Internal Medicine

## 2023-04-03 DIAGNOSIS — R9082 White matter disease, unspecified: Secondary | ICD-10-CM | POA: Diagnosis not present

## 2023-04-03 DIAGNOSIS — Z743 Need for continuous supervision: Secondary | ICD-10-CM | POA: Diagnosis not present

## 2023-04-03 DIAGNOSIS — G8929 Other chronic pain: Secondary | ICD-10-CM | POA: Diagnosis present

## 2023-04-03 DIAGNOSIS — I2693 Single subsegmental pulmonary embolism without acute cor pulmonale: Secondary | ICD-10-CM | POA: Diagnosis not present

## 2023-04-03 DIAGNOSIS — Z7901 Long term (current) use of anticoagulants: Secondary | ICD-10-CM

## 2023-04-03 DIAGNOSIS — I82409 Acute embolism and thrombosis of unspecified deep veins of unspecified lower extremity: Secondary | ICD-10-CM | POA: Diagnosis not present

## 2023-04-03 DIAGNOSIS — I2699 Other pulmonary embolism without acute cor pulmonale: Secondary | ICD-10-CM | POA: Diagnosis not present

## 2023-04-03 DIAGNOSIS — Z79899 Other long term (current) drug therapy: Secondary | ICD-10-CM

## 2023-04-03 DIAGNOSIS — H4921 Sixth [abducent] nerve palsy, right eye: Secondary | ICD-10-CM | POA: Diagnosis present

## 2023-04-03 DIAGNOSIS — I619 Nontraumatic intracerebral hemorrhage, unspecified: Secondary | ICD-10-CM | POA: Diagnosis not present

## 2023-04-03 DIAGNOSIS — I5A Non-ischemic myocardial injury (non-traumatic): Secondary | ICD-10-CM | POA: Diagnosis present

## 2023-04-03 DIAGNOSIS — H547 Unspecified visual loss: Secondary | ICD-10-CM | POA: Diagnosis not present

## 2023-04-03 DIAGNOSIS — R410 Disorientation, unspecified: Secondary | ICD-10-CM | POA: Diagnosis not present

## 2023-04-03 DIAGNOSIS — R519 Headache, unspecified: Secondary | ICD-10-CM | POA: Diagnosis not present

## 2023-04-03 DIAGNOSIS — G936 Cerebral edema: Secondary | ICD-10-CM | POA: Diagnosis present

## 2023-04-03 DIAGNOSIS — F17210 Nicotine dependence, cigarettes, uncomplicated: Secondary | ICD-10-CM | POA: Diagnosis present

## 2023-04-03 DIAGNOSIS — G4489 Other headache syndrome: Secondary | ICD-10-CM | POA: Diagnosis not present

## 2023-04-03 DIAGNOSIS — H53461 Homonymous bilateral field defects, right side: Secondary | ICD-10-CM | POA: Diagnosis present

## 2023-04-03 DIAGNOSIS — R29702 NIHSS score 2: Secondary | ICD-10-CM | POA: Diagnosis present

## 2023-04-03 DIAGNOSIS — R471 Dysarthria and anarthria: Secondary | ICD-10-CM | POA: Diagnosis present

## 2023-04-03 DIAGNOSIS — R29818 Other symptoms and signs involving the nervous system: Secondary | ICD-10-CM | POA: Diagnosis not present

## 2023-04-03 DIAGNOSIS — I480 Paroxysmal atrial fibrillation: Secondary | ICD-10-CM | POA: Diagnosis not present

## 2023-04-03 DIAGNOSIS — E785 Hyperlipidemia, unspecified: Secondary | ICD-10-CM | POA: Diagnosis present

## 2023-04-03 DIAGNOSIS — I639 Cerebral infarction, unspecified: Secondary | ICD-10-CM | POA: Diagnosis not present

## 2023-04-03 DIAGNOSIS — I82451 Acute embolism and thrombosis of right peroneal vein: Secondary | ICD-10-CM | POA: Diagnosis not present

## 2023-04-03 DIAGNOSIS — I82441 Acute embolism and thrombosis of right tibial vein: Secondary | ICD-10-CM | POA: Diagnosis not present

## 2023-04-03 DIAGNOSIS — I10 Essential (primary) hypertension: Secondary | ICD-10-CM | POA: Diagnosis present

## 2023-04-03 DIAGNOSIS — I611 Nontraumatic intracerebral hemorrhage in hemisphere, cortical: Secondary | ICD-10-CM | POA: Diagnosis not present

## 2023-04-03 DIAGNOSIS — K219 Gastro-esophageal reflux disease without esophagitis: Secondary | ICD-10-CM | POA: Diagnosis present

## 2023-04-03 DIAGNOSIS — F419 Anxiety disorder, unspecified: Secondary | ICD-10-CM | POA: Diagnosis present

## 2023-04-03 DIAGNOSIS — R58 Hemorrhage, not elsewhere classified: Secondary | ICD-10-CM | POA: Diagnosis not present

## 2023-04-03 DIAGNOSIS — H4910 Fourth [trochlear] nerve palsy, unspecified eye: Secondary | ICD-10-CM | POA: Diagnosis present

## 2023-04-03 DIAGNOSIS — R0902 Hypoxemia: Secondary | ICD-10-CM | POA: Diagnosis not present

## 2023-04-03 DIAGNOSIS — I700 Atherosclerosis of aorta: Secondary | ICD-10-CM | POA: Diagnosis not present

## 2023-04-03 DIAGNOSIS — R932 Abnormal findings on diagnostic imaging of liver and biliary tract: Secondary | ICD-10-CM | POA: Diagnosis not present

## 2023-04-03 DIAGNOSIS — I82462 Acute embolism and thrombosis of left calf muscular vein: Secondary | ICD-10-CM | POA: Diagnosis not present

## 2023-04-03 DIAGNOSIS — K5730 Diverticulosis of large intestine without perforation or abscess without bleeding: Secondary | ICD-10-CM | POA: Diagnosis not present

## 2023-04-03 DIAGNOSIS — M1990 Unspecified osteoarthritis, unspecified site: Secondary | ICD-10-CM | POA: Diagnosis present

## 2023-04-03 DIAGNOSIS — S06320A Contusion and laceration of left cerebrum without loss of consciousness, initial encounter: Secondary | ICD-10-CM | POA: Diagnosis not present

## 2023-04-03 DIAGNOSIS — I6782 Cerebral ischemia: Secondary | ICD-10-CM | POA: Diagnosis not present

## 2023-04-03 HISTORY — DX: Unspecified atrial fibrillation: I48.91

## 2023-04-03 LAB — I-STAT CHEM 8, ED
BUN: 12 mg/dL (ref 8–23)
Calcium, Ion: 1.13 mmol/L — ABNORMAL LOW (ref 1.15–1.40)
Chloride: 105 mmol/L (ref 98–111)
Creatinine, Ser: 0.8 mg/dL (ref 0.61–1.24)
Glucose, Bld: 102 mg/dL — ABNORMAL HIGH (ref 70–99)
HCT: 39 % (ref 39.0–52.0)
Hemoglobin: 13.3 g/dL (ref 13.0–17.0)
Potassium: 3.8 mmol/L (ref 3.5–5.1)
Sodium: 139 mmol/L (ref 135–145)
TCO2: 23 mmol/L (ref 22–32)

## 2023-04-03 LAB — CBG MONITORING, ED: Glucose-Capillary: 101 mg/dL — ABNORMAL HIGH (ref 70–99)

## 2023-04-03 LAB — DIFFERENTIAL
Abs Immature Granulocytes: 0.02 10*3/uL (ref 0.00–0.07)
Basophils Absolute: 0 10*3/uL (ref 0.0–0.1)
Basophils Relative: 0 %
Eosinophils Absolute: 0.2 10*3/uL (ref 0.0–0.5)
Eosinophils Relative: 2 %
Immature Granulocytes: 0 %
Lymphocytes Relative: 23 %
Lymphs Abs: 1.6 10*3/uL (ref 0.7–4.0)
Monocytes Absolute: 0.4 10*3/uL (ref 0.1–1.0)
Monocytes Relative: 6 %
Neutro Abs: 5 10*3/uL (ref 1.7–7.7)
Neutrophils Relative %: 69 %

## 2023-04-03 LAB — CBC
HCT: 40.4 % (ref 39.0–52.0)
Hemoglobin: 13.7 g/dL (ref 13.0–17.0)
MCH: 30.5 pg (ref 26.0–34.0)
MCHC: 33.9 g/dL (ref 30.0–36.0)
MCV: 90 fL (ref 80.0–100.0)
Platelets: 324 10*3/uL (ref 150–400)
RBC: 4.49 MIL/uL (ref 4.22–5.81)
RDW: 12.2 % (ref 11.5–15.5)
WBC: 7.2 10*3/uL (ref 4.0–10.5)
nRBC: 0 % (ref 0.0–0.2)

## 2023-04-03 LAB — URINALYSIS, ROUTINE W REFLEX MICROSCOPIC
Bilirubin Urine: NEGATIVE
Glucose, UA: NEGATIVE mg/dL
Hgb urine dipstick: NEGATIVE
Ketones, ur: NEGATIVE mg/dL
Leukocytes,Ua: NEGATIVE
Nitrite: NEGATIVE
Protein, ur: NEGATIVE mg/dL
Specific Gravity, Urine: 1.016 (ref 1.005–1.030)
pH: 7 (ref 5.0–8.0)

## 2023-04-03 LAB — COMPREHENSIVE METABOLIC PANEL
ALT: 16 U/L (ref 0–44)
AST: 26 U/L (ref 15–41)
Albumin: 3.7 g/dL (ref 3.5–5.0)
Alkaline Phosphatase: 76 U/L (ref 38–126)
Anion gap: 9 (ref 5–15)
BUN: 11 mg/dL (ref 8–23)
CO2: 23 mmol/L (ref 22–32)
Calcium: 9.1 mg/dL (ref 8.9–10.3)
Chloride: 106 mmol/L (ref 98–111)
Creatinine, Ser: 0.89 mg/dL (ref 0.61–1.24)
GFR, Estimated: >60 mL/min (ref >60)
Glucose, Bld: 109 mg/dL — ABNORMAL HIGH (ref 70–99)
Potassium: 4 mmol/L (ref 3.5–5.1)
Sodium: 138 mmol/L (ref 135–145)
Total Bilirubin: 0.9 mg/dL (ref 0.3–1.2)
Total Protein: 6.8 g/dL (ref 6.5–8.1)

## 2023-04-03 LAB — RAPID URINE DRUG SCREEN, HOSP PERFORMED
Amphetamines: NOT DETECTED
Barbiturates: NOT DETECTED
Benzodiazepines: POSITIVE — AB
Cocaine: NOT DETECTED
Opiates: NOT DETECTED
Tetrahydrocannabinol: POSITIVE — AB

## 2023-04-03 LAB — MRSA NEXT GEN BY PCR, NASAL: MRSA by PCR Next Gen: NOT DETECTED

## 2023-04-03 LAB — PROTIME-INR
INR: 2 — ABNORMAL HIGH (ref 0.8–1.2)
Prothrombin Time: 22.8 seconds — ABNORMAL HIGH (ref 11.4–15.2)

## 2023-04-03 LAB — APTT: aPTT: 40 seconds — ABNORMAL HIGH (ref 24–36)

## 2023-04-03 LAB — ETHANOL: Alcohol, Ethyl (B): <10 mg/dL (ref <10)

## 2023-04-03 MED ORDER — PANTOPRAZOLE SODIUM 40 MG IV SOLR: 40.0000 mg | Freq: Every day | INTRAVENOUS | Status: DC

## 2023-04-03 MED ORDER — HYDROCODONE-ACETAMINOPHEN 10-325 MG PO TABS: 1.0000 | ORAL_TABLET | Freq: Four times a day (QID) | ORAL | Status: DC | PRN

## 2023-04-03 MED ORDER — IOHEXOL 350 MG/ML SOLN
75.0000 mL | Freq: Once | INTRAVENOUS | Status: AC | PRN
  Administered 2023-04-03: 75 mL via INTRAVENOUS

## 2023-04-03 MED ORDER — VITAMIN D 25 MCG (1000 UNIT) PO TABS
5000.0000 [IU] | ORAL_TABLET | Freq: Every day | ORAL | Status: DC
  Administered 2023-04-04 – 2023-04-06 (×3): 5000 [IU] via ORAL
  Filled 2023-04-03 (×3): qty 5

## 2023-04-03 MED ORDER — ACETAMINOPHEN 160 MG/5ML PO SOLN: 650.0000 mg | ORAL | Status: DC | PRN

## 2023-04-03 MED ORDER — LABETALOL HCL 5 MG/ML IV SOLN
20.0000 mg | Freq: Once | INTRAVENOUS | Status: AC
  Administered 2023-04-03: 20 mg via INTRAVENOUS

## 2023-04-03 MED ORDER — PANTOPRAZOLE SODIUM 40 MG PO TBEC
40.0000 mg | DELAYED_RELEASE_TABLET | Freq: Every day | ORAL | Status: DC
  Administered 2023-04-04 – 2023-04-06 (×3): 40 mg via ORAL
  Filled 2023-04-03 (×3): qty 1

## 2023-04-03 MED ORDER — ACETAMINOPHEN 650 MG RE SUPP: 650.0000 mg | RECTAL | Status: DC | PRN

## 2023-04-03 MED ORDER — SENNOSIDES-DOCUSATE SODIUM 8.6-50 MG PO TABS
1.0000 | ORAL_TABLET | Freq: Two times a day (BID) | ORAL | Status: DC
  Administered 2023-04-03 – 2023-04-06 (×3): 1 via ORAL
  Filled 2023-04-03 (×5): qty 1

## 2023-04-03 MED ORDER — ACETAMINOPHEN 325 MG PO TABS
650.0000 mg | ORAL_TABLET | ORAL | Status: DC | PRN
  Administered 2023-04-03 – 2023-04-04 (×2): 650 mg via ORAL
  Filled 2023-04-03 (×3): qty 2

## 2023-04-03 MED ORDER — CHLORHEXIDINE GLUCONATE CLOTH 2 % EX PADS
6.0000 | MEDICATED_PAD | Freq: Every day | CUTANEOUS | Status: DC
  Administered 2023-04-04 – 2023-04-06 (×3): 6 via TOPICAL

## 2023-04-03 MED ORDER — ALPRAZOLAM 0.25 MG PO TABS
0.2500 mg | ORAL_TABLET | Freq: Two times a day (BID) | ORAL | Status: DC | PRN
  Administered 2023-04-03 – 2023-04-05 (×3): 0.25 mg via ORAL
  Filled 2023-04-03 (×3): qty 1

## 2023-04-03 MED ORDER — CLEVIDIPINE BUTYRATE 0.5 MG/ML IV EMUL
0.0000 mg/h | INTRAVENOUS | Status: DC
  Administered 2023-04-03 (×2): 2 mg/h via INTRAVENOUS
  Filled 2023-04-03: qty 100

## 2023-04-03 MED ORDER — HYDROCODONE-ACETAMINOPHEN 5-325 MG PO TABS
2.0000 | ORAL_TABLET | Freq: Four times a day (QID) | ORAL | Status: DC | PRN
  Administered 2023-04-03 – 2023-04-06 (×6): 2 via ORAL
  Filled 2023-04-03 (×7): qty 2

## 2023-04-03 MED ORDER — EMPTY CONTAINERS FLEXIBLE MISC
900.0000 mg | Freq: Once | Status: AC
  Administered 2023-04-03: 900 mg via INTRAVENOUS
  Filled 2023-04-03: qty 90

## 2023-04-03 MED ORDER — STROKE: EARLY STAGES OF RECOVERY BOOK
Freq: Once | Status: AC
  Filled 2023-04-03: qty 1

## 2023-04-03 NOTE — ED Triage Notes (Signed)
Per [facility] pt coming from home as code stroke with LKN 14:30. Patient states he laid down for a nap at this time. Patient began having headache and vision issues. Wife reports patient had a brief episode of confusion where he could not type a text on his phone which is very unusual for him. Patient states he could not walk steady. Patient recently started on xerlto for a-fib diagnosis. Patient A&0x4 on arrival.

## 2023-04-03 NOTE — H&P (Signed)
NEUROLOGY H&P NOTE   Date of service: April 03, 2023 Patient Name: CLIDE BRAINERD MRN:  [id number] DOB:  1951/04/02  _ _ _   _ __   _ __ _ _  __ __   _ __   __ _  History of Present Illness  ROSA GUILBAULT is a 72 y.o. male with PMH significant for afibb, recent PE and started on Xarelto 2 days ago, chronic R CN6 palsy, GERD who presents with task specific apraxia and R hemianopsia.  Got up from his nap at 1630. He took a shower and wiped the shower clean without any trouble. When he got out of shower, texted a friend and noted he could not figure out how to send the text. Around the same time, noticed that he tripped over his grand daughter and was having trouble with his vision. Family called EMS and he was brought in as a code stroke.  Ems report that his blood pressure was high. Recently started on Xarelto for PE and took 15mg  this AM around 0930.  Last known well: 1630 Modified rankin score: 0-Completely asymptomatic and back to baseline post- stroke ICH Score: 1 tNKASE: Not offered due to ICH Thrombectomy: not offered due to ICH NIHSS components Score: Comment  1a Level of Conscious 0[]  1[]  2[]  3[]      1b LOC Questions 0[]  1[]  2[]       1c LOC Commands 0[]  1[]  2[]       2 Best Gaze 0[]  1[]  2[]       3 Visual 0[]  1[]  2[]  3[]      4 Facial Palsy 0[]  1[]  2[]  3[]      5a Motor Arm - left 0[]  1[]  2[]  3[]  4[]  UN[]    5b Motor Arm - Right 0[]  1[]  2[]  3[]  4[]  UN[]    6a Motor Leg - Left 0[]  1[]  2[]  3[]  4[]  UN[]    6b Motor Leg - Right 0[]  1[]  2[]  3[]  4[]  UN[]    7 Limb Ataxia 0[]  1[]  2[]  3[]  UN[]     8 Sensory 0[]  1[]  2[]  UN[]      9 Best Language 0[]  1[]  2[]  3[]      10 Dysarthria 0[]  1[]  2[]  UN[]      11 Extinct. and Inattention 0[]  1[]  2[]       TOTAL:       ROS   A detailed 10 point review of system was completed and negative except above.  Past History   Past Medical History:  Diagnosis Date   Allergic rhinitis    Atrial fibrillation (HCC)    Colon polyps     Epididymal cyst    right   GERD (gastroesophageal reflux disease)    Hepatitis 1961   hepatitis a as child, no liver oproblems since   Hip pain    both   Past Surgical History:  Procedure Laterality Date   APPENDECTOMY     colonscopy  07/27/2016   EPIDIDYMECTOMY  08/29/2002   Sees Dr Patsi Sears twice a year left side   EPIDIDYMECTOMY Right 09/09/2016   Procedure: EPIDIDYMECTOMY;  Surgeon: Jethro Bolus, MD;  Location: [facility];  Service: Urology;  Laterality: Right;   NASAL SINUS SURGERY  2008   Family History  Problem Relation Age of Onset   Arthritis Unknown    Stroke Unknown    Cancer Neg Hx    Social History   Socioeconomic History   Marital status: Married    Spouse name: Not on file   Number of children: Not on file  Years of education: Not on file   Highest education level: Bachelor's degree (e.g., BA, AB, BS)  Occupational History   Occupation: Retired  Tobacco Use   Smoking status: Former    Current packs/day: 1.00    Average packs/day: 1 pack/day for 7.0 years (7.0 ttl pk-yrs)    Types: Cigarettes    Passive exposure: Never   Smokeless tobacco: Never  Substance and Sexual Activity   Alcohol use: Yes    Alcohol/week: 21.0 standard drinks of alcohol    Types: 21 Cans of beer per week   Drug use: No   Sexual activity: Not Currently    Partners: Female  Other Topics Concern   Not on file  Social History Narrative   Regular Exercise -  YES         Social Determinants of Health   Financial Resource Strain: Low Risk  (03/28/2023)   Overall Financial Resource Strain (CARDIA)    Difficulty of Paying Living Expenses: Not hard at all  Food Insecurity: No Food Insecurity (03/31/2023)   Hunger Vital Sign    Worried About Running Out of Food in the Last Year: Never true    Ran Out of Food in the Last Year: Never true  Transportation Needs: No Transportation Needs (03/31/2023)   PRAPARE - Administrator, Civil Service (Medical): No    Lack of  Transportation (Non-Medical): No  Physical Activity: Sufficiently Active (03/28/2023)   Exercise Vital Sign    Days of Exercise per Week: 5 days    Minutes of Exercise per Session: 60 min  Stress: No Stress Concern Present (03/28/2023)   [employer] of Occupational Health - Occupational Stress Questionnaire    Feeling of Stress : Not at all  Social Connections: Moderately Isolated (03/28/2023)   Social Connection and Isolation Panel [NHANES]    Frequency of Communication with Friends and Family: More than three times a week    Frequency of Social Gatherings with Friends and Family: Three times a week    Attends Religious Services: Never    Active Member of Clubs or Organizations: No    Attends Banker Meetings: Not on file    Marital Status: Married   No Known Allergies  Medications  (Not in a hospital admission)    Vitals   Vitals:   04/03/23 1900 04/03/23 1911 04/03/23 1913  BP:   (!) 155/93  Pulse:   87  Resp:   18  SpO2:   100%  Weight: 74.8 kg    Height:  6' (1.829 m)      Body mass index is 22.37 kg/m.  Physical Exam   Constitutional: Appears well-developed and well-nourished.  Psych: Affect appropriate to situation.  Eyes: No scleral injection.  HENT: No OP obstrucion.  Head: Normocephalic.  Cardiovascular: Normal rate and regular rhythm.  Respiratory: Effort normal, non-labored breathing.  GI: Soft.  No distension. There is no tenderness.  Skin: WDI.   Neurologic Examination  Mental status/Cognition: Alert, oriented to self, place, month and year, good attention.  Speech/language: Fluent, comprehension intact, object naming intact, repetition intact.  Cranial nerves:   CN II Pupils equal and reactive to light, R hemianopsia.   CN III,IV,VI R CN6 palsy, no nystagmus.   CN V Facial sensation is symmetric to touch    CN VII Face is symmetric to movement   CN VIII Hearing is intact to voice   CN IX & X normal palatal elevation, no uvular  deviation  CN XI Shoulder shrug is symmetric   CN XII midline tongue with no atrophy or fasciculations.   Motor:  Tone is normal. Bulk is normal. 5/5 strength was present in all four extremities.    Reflexes: 2+ and symmetric in the biceps and patellae.   Plantars: Toes are downgoing bilaterally.    Sensation: Sensation is symmetric to light touch and temperature in the arms and legs.   Coordination/Complex Motor:  - FNF and HKS are intact bilaterally - Rapid alternating movement are normal.  Labs   CBC:  Recent Labs  Lab 03/31/23 1050 03/31/23 1740 04/01/23 0201 04/03/23 1907  WBC 7.6   < > 6.3 7.2  NEUTROABS 5.8  --   --  5.0  HGB 14.0   < > 11.8* 13.7  13.3  HCT 42.3   < > 34.8* 40.4  39.0  MCV 92.2   < > 88.5 90.0  PLT 373.0   < > 290 324   < > = values in this interval not displayed.    Basic Metabolic Panel:  Lab Results  Component Value Date   NA 139 04/03/2023   NA 138 04/03/2023   K 3.8 04/03/2023   K 4.0 04/03/2023   CO2 23 04/03/2023   GLUCOSE 102 (H) 04/03/2023   GLUCOSE 109 (H) 04/03/2023   BUN 12 04/03/2023   BUN 11 04/03/2023   CREATININE 0.80 04/03/2023   CREATININE 0.89 04/03/2023   CALCIUM 9.1 04/03/2023   GFRNONAA >60 04/03/2023   GFRAA >60 09/08/2017   Lipid Panel:  Lab Results  Component Value Date   LDLCALC 68 02/03/2023   HgbA1c:  Lab Results  Component Value Date   HGBA1C 5.5 09/14/2017   Urine Drug Screen: No results found for: "LABOPIA", "COCAINSCRNUR", "LABBENZ", "AMPHETMU", "THCU", "LABBARB"  Alcohol Level     Component Value Date/Time   ETH <10 04/03/2023 1907   INR  Lab Results  Component Value Date   INR 2.0 (H) 04/03/2023   APTT  Lab Results  Component Value Date   APTT 40 (H) 04/03/2023     CT Head without contrast(Personally reviewed): L parieto-occipital ICH  CT angio Head and Neck with contrast(Personally reviewed): No AVM, no aneurysm, no cavernoma.  MRI Brain: pending   Impression    FAVOR RAUNER is a 72 y.o. 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  Etiology of ICH is likely a combination of anticoagulation and hypertension.  Recommendations  - Admit to ICU - Stability scan in 6 hours or STAT with any neurological decline - Frequent neuro checks; q14min for 1 hour, then q1hour - No antiplatelets or anticoagulants due to ICH - SCD for DVT prophylaxis, pharmacological DVT ppx at 24 hours if ICH is stable - Blood pressure control with goal systolic 130 - 150, cleverplex and labetalol PRN - Stroke labs, HgbA1c, fasting lipid panel - MRI brain with and without contrast when stabilized to evaluate for underlying mass - MRA without contrast of the brain and Vasc US carotid duplex to evaluate for underlying vascular abnormality. - Risk factor modification - Echocardiogram - PT consult, OT consult, Speech consult. - Stroke team to follow - Xarelto reversed with Andexxa. - no driving, this was discussed with patient in person and with his wife over phone.   Afibb: - hold Xarelto and  Cardizem overnight. Will use PRN Cleviprex and metoprolol if HR above 120.  GERD: - continue home PPI  Anxiety: - conh home Xanax PRN  PE: - hold xarelto, cont pulse ox in the ICU. May need to consider IVC filter ______________________________________________________________________   Allergies verified and now known allergies. Patient is full code.  Thank you for the opportunity to take part in the care of this patient. If you have any further questions, please contact the neurology consultation attending.  This patient is critically ill and at significant risk of neurological worsening, death and care requires constant monitoring of vital signs, hemodynamics,respiratory and cardiac monitoring, neurological  assessment, discussion with family, other specialists and medical decision making of high complexity. I spent 70 minutes of neurocritical care time  in the care of  this patient. This was time spent independent of any time provided by nurse practitioner or PA.  Erick Blinks [facility] 04/03/2023  8:13 PM   Signed,  Erick Blinks [facility] _ _ _   _ __   _ __ _ _  __ __   _ __   __ _

## 2023-04-03 NOTE — Telephone Encounter (Signed)
Spoke with patient and got him schedule for Friday, 9/27 @ 10am. Patient appreciative for call and appointment.  No additional questions at this time.

## 2023-04-03 NOTE — ED Provider Notes (Signed)
[place] EMERGENCY DEPARTMENT AT [facility] Provider Note   CSN: [id number] Arrival date & time: 04/03/23  1859  An emergency department physician performed an initial assessment on this suspected stroke patient at 1900.  History  Chief Complaint  Patient presents with   Code Stroke    Mark Cohen is a 72 y.o. male.  HPI Patient presented as a code stroke.  Last normal around 230 when he laid down for a nap.  Woke up with headaches and had some confusion.  States he had difficulty sending a text.  States he was able to type it out but could not figure out how to send a text.  Was hypertensive.  Also states he has having trouble seeing out of the right eye.  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  Recent admission in the hospital for pulmonary embolism and new onset A-fib.  Patient is on Xarelto.  He stated that he took it this morning but is not sure.  He definitely took it yesterday.   Past Medical History:  Diagnosis Date   Allergic rhinitis    Atrial fibrillation (HCC)    Colon polyps    Epididymal cyst    right   GERD (gastroesophageal reflux disease)    Hepatitis 1961   hepatitis a as child, no liver oproblems since   Hip pain    both    Home Medications Prior to Admission medications   Medication Sig Start Date End Date Taking? Authorizing Provider  ALPRAZolam (XANAX) 0.25 MG tablet Take 1 tablet (0.25 mg total) by mouth 2 (two) times daily as needed. Patient taking differently: Take 0.25 mg by mouth 2 (two) times daily as needed for anxiety. 03/30/23   Etta Grandchild, MD  Cholecalciferol (DIALYVITE VITAMIN D 5000 PO) Take 1 tablet by mouth daily.    [provider]  diltiazem (CARDIZEM CD) 120 MG 24 hr capsule Take 1 capsule (120 mg total) by mouth daily. 03/29/23   Etta Grandchild, MD  esomeprazole (NEXIUM) 40 MG capsule TAKE 1 CAPSULE BY MOUTH DAILY 10/02/22   Etta Grandchild, MD   HYDROcodone-acetaminophen (NORCO) 10-325 MG tablet Take 1 tablet by mouth every 8 (eight) hours. Patient taking differently: Take 1 tablet by mouth every 6 (six) hours as needed for moderate pain. 03/30/23   Etta Grandchild, MD  Rivaroxaban (XARELTO) 15 MG TABS tablet Take 1 tablet (15 mg total) by mouth 2 (two) times daily with a meal for 21 days. 04/01/23 04/22/23  Danford, Earl Lites, MD      Allergies    Patient has no known allergies.    Review of Systems   Review of Systems  Physical Exam Updated Vital Signs BP (!) 155/93   Pulse 87   Resp 18   Ht 6' (1.829 m)   Wt 74.8 kg   SpO2 100%   BMI 22.37 kg/m  Physical Exam Eyes:     Comments: Right eye lateral hemianopsia.  Finger-nose intact.  Face symmetric.  Good grip things bilaterally.  Good straight leg raise bilaterally.  Complete NIH scoring done by neurology.  Musculoskeletal:        General: Normal range of motion.     Cervical back: Neck supple.  Neurological:     Mental Status: He is alert and oriented to person, place, and time.     ED Results / Procedures / Treatments   Labs (all labs ordered  are listed, but only abnormal results are displayed) Labs Reviewed  PROTIME-INR - Abnormal; Notable for the following components:      Result Value   Prothrombin Time 22.8 (*)    INR 2.0 (*)    All other components within normal limits  APTT - Abnormal; Notable for the following components:   aPTT 40 (*)    All other components within normal limits  COMPREHENSIVE METABOLIC PANEL - Abnormal; Notable for the following components:   Glucose, Bld 109 (*)    All other components within normal limits  I-STAT CHEM 8, ED - Abnormal; Notable for the following components:   Glucose, Bld 102 (*)    Calcium, Ion 1.13 (*)    All other components within normal limits  CBG MONITORING, ED - Abnormal; Notable for the following components:   Glucose-Capillary 101 (*)    All other components within normal limits  ETHANOL  CBC   DIFFERENTIAL  RAPID URINE DRUG SCREEN, HOSP PERFORMED  URINALYSIS, ROUTINE W REFLEX MICROSCOPIC    EKG None  Radiology CT HEAD CODE STROKE WO CONTRAST  Result Date: 04/03/2023 CLINICAL DATA:  Code stroke. Initial evaluation for neuro deficit, stroke suspected. EXAM: CT HEAD WITHOUT CONTRAST TECHNIQUE: Contiguous axial images were obtained from the base of the skull through the vertex without intravenous contrast. RADIATION DOSE REDUCTION: This exam was performed according to the departmental dose-optimization program which includes automated exposure control, adjustment of the mA and/or kV according to patient size and/or use of iterative reconstruction technique. COMPARISON:  Prior study from 06/03/2005. FINDINGS: Brain: Acute intraparenchymal hematoma centered at the left parieto-occipital convexity measures 5.3 x 4.0 x 3.0 cm (estimated volume 32 mL). Surrounding vasogenic edema without significant regional mass effect or midline shift. No intraventricular or extra-axial extension. No other acute intracranial hemorrhage. No other large vessel territory infarct. Underlying mild chronic microvascular ischemic disease noted. No midline shift or hydrocephalus. Vascular: No abnormal hyperdense vessel. Skull: Scalp soft tissues and calvarium demonstrate no acute finding. Sinuses/Orbits: Globes and orbital soft tissues within normal limits. Scattered mucosal thickening present about the ethmoidal air cells. No mastoid effusion. Other: None. ASPECTS [facility] Stroke Program Early CT Score) Does not apply, acute ICH. IMPRESSION: 5.3 x 4.0 x 3.0 cm acute intraparenchymal hematoma centered at the left parieto-occipital convexity (estimated volume 32 mL). Surrounding vasogenic edema without significant regional mass effect or midline shift. These results were communicated to Dr. Derry Lory at 7:19 pm on 04/03/2023 by text page via the [facility] messaging system. Electronically Signed   By: Rise Mu M.D.    On: 04/03/2023 19:20    Procedures Procedures    Medications Ordered in ED Medications  coag fact Xa recombinant (ANDEXXA) low dose infusion 900 mg (has no administration in time range)   stroke: early stages of recovery book (has no administration in time range)  acetaminophen (TYLENOL) tablet 650 mg (has no administration in time range)    Or  acetaminophen (TYLENOL) 160 MG/5ML solution 650 mg (has no administration in time range)    Or  acetaminophen (TYLENOL) suppository 650 mg (has no administration in time range)  senna-docusate (Senokot-S) tablet 1 tablet (has no administration in time range)  pantoprazole (PROTONIX) injection 40 mg (has no administration in time range)  labetalol (NORMODYNE) injection 20 mg (has no administration in time range)    And  clevidipine (CLEVIPREX) infusion 0.5 mg/mL (has no administration in time range)  iohexol (OMNIPAQUE) 350 MG/ML injection 75 mL (75 mLs Intravenous Contrast Given 04/03/23  [facility])    ED Course/ Medical Decision Making/ A&P                                 Medical Decision Making Risk Decision regarding hospitalization.   Patient with vision change and confusion with headache.  Is on blood thinners.  Came in as a code stroke.  Differential diagnosis includes dangerous causes of mental status change such as stroke, intracranial hemorrhage, seizure.  Met at the bridge by myself and neurology.  Taken to head CT emergently and found to have intracranial hemorrhage.  Independently interpreted CT myself.  Discussed results with patient.  Will be admitted to ICU to neurology.  Will reverse patient's anticoagulation.  Will also potentially require tighter blood pressure control.   CRITICAL CARE Performed by: Benjiman Core Total critical care time: 30 minutes Critical care time was exclusive of separately billable procedures and treating other patients. Critical care was necessary to treat or prevent imminent or life-threatening  deterioration. Critical care was time spent personally by me on the following activities: development of treatment plan with patient and/or surrogate as well as nursing, discussions with consultants, evaluation of patient's response to treatment, examination of patient, obtaining history from patient or surrogate, ordering and performing treatments and interventions, ordering and review of laboratory studies, ordering and review of radiographic studies, pulse oximetry and re-evaluation of patient's condition.         Final Clinical Impression(s) / ED Diagnoses Final diagnoses:  Nontraumatic intracerebral hemorrhage, unspecified cerebral location, unspecified laterality [facility])    Rx / DC Orders ED Discharge Orders     None         Benjiman Core, MD 04/03/23 2302

## 2023-04-03 NOTE — Code Documentation (Signed)
Stroke Response Nurse Documentation Code Documentation  Mark Cohen is a 72 y.o. male arriving to [facility]  via [facility] on 9/23 with past medical hx of Afib,GERD, HTN, HLD, anxiety. On Xarelto (rivaroxaban) daily. Code stroke was activated by EMS.   Patient from home where he was LKW at 1700 and now complaining of vision changes and slurred speech.   Stroke team at the bedside on patient arrival. Labs drawn and patient cleared for CT by Dr. Rubin Payor. Patient to CT with team. NIHSS 2, see documentation for details and code stroke times. Patient with right hemianopia on exam. The following imaging was completed:  CT Head. Patient is not a candidate for IV Thrombolytic due to ICH. Patient is not a candidate for IR due to ICH.   Care Plan: Reversal of Xarelto and cleviprex.   Bedside handoff with ED RN [facility].    Rose Fillers  Rapid Response RN

## 2023-04-03 NOTE — Telephone Encounter (Signed)
This encounter was created in error - please disregard.

## 2023-04-03 NOTE — Telephone Encounter (Signed)
Pt states that he want to make a appointment. He went to the hospital and he was released. Pt needs a hospital follow up with DR Yetta Barre ASAP because that's what the hospital said he needs to do. But he can't wait or hold out until OCT he need something this week. He said this is very serious and he also aid he spoke with Cala Bradford as well about his issues.

## 2023-04-04 ENCOUNTER — Inpatient Hospital Stay (HOSPITAL_COMMUNITY): Payer: Medicare HMO

## 2023-04-04 ENCOUNTER — Encounter (HOSPITAL_COMMUNITY): Payer: Self-pay | Admitting: Neurology

## 2023-04-04 DIAGNOSIS — I611 Nontraumatic intracerebral hemorrhage in hemisphere, cortical: Secondary | ICD-10-CM | POA: Diagnosis not present

## 2023-04-04 HISTORY — PX: IR IVC FILTER PLMT / S&I /IMG GUID/MOD SED: IMG701

## 2023-04-04 HISTORY — PX: IR IVC FILTER RETRIEVAL / S&I /IMG GUID/MOD SED: IMG5308

## 2023-04-04 LAB — HEMOGLOBIN A1C
Hgb A1c MFr Bld: 5.7 % — ABNORMAL HIGH (ref 4.8–5.6)
Mean Plasma Glucose: 116.89 mg/dL

## 2023-04-04 MED ORDER — ORAL CARE MOUTH RINSE: 15.0000 mL | OROMUCOSAL | Status: DC | PRN

## 2023-04-04 MED ORDER — LABETALOL HCL 5 MG/ML IV SOLN
10.0000 mg | INTRAVENOUS | Status: DC | PRN
  Administered 2023-04-04: 10 mg via INTRAVENOUS
  Filled 2023-04-04: qty 4

## 2023-04-04 MED ORDER — DIAZEPAM 5 MG PO TABS
2.5000 mg | ORAL_TABLET | Freq: Once | ORAL | Status: AC
  Administered 2023-04-04: 2.5 mg via ORAL
  Filled 2023-04-04: qty 1

## 2023-04-04 MED ORDER — MIDAZOLAM HCL 2 MG/2ML IJ SOLN
INTRAMUSCULAR | Status: AC
  Filled 2023-04-04: qty 2

## 2023-04-04 MED ORDER — GADOBUTROL 1 MMOL/ML IV SOLN
7.5000 mL | Freq: Once | INTRAVENOUS | Status: AC | PRN
  Administered 2023-04-04: 7.5 mL via INTRAVENOUS

## 2023-04-04 MED ORDER — DILTIAZEM HCL ER COATED BEADS 120 MG PO CP24
120.0000 mg | ORAL_CAPSULE | Freq: Every day | ORAL | Status: DC
  Administered 2023-04-04 – 2023-04-06 (×3): 120 mg via ORAL
  Filled 2023-04-04 (×3): qty 1

## 2023-04-04 MED ORDER — LIDOCAINE HCL 1 % IJ SOLN
INTRAMUSCULAR | Status: AC
  Filled 2023-04-04: qty 20

## 2023-04-04 MED ORDER — CHOLECALCIFEROL 10 MCG (400 UNIT) PO TABS
400.0000 [IU] | ORAL_TABLET | Freq: Every day | ORAL | Status: DC
  Filled 2023-04-04: qty 1

## 2023-04-04 MED ORDER — IOHEXOL 300 MG/ML  SOLN
100.0000 mL | Freq: Once | INTRAMUSCULAR | Status: AC | PRN
  Administered 2023-04-04: 75 mL via INTRAVENOUS

## 2023-04-04 MED ORDER — LIDOCAINE HCL (PF) 1 % IJ SOLN
20.0000 mL | Freq: Once | INTRAMUSCULAR | Status: AC
  Administered 2023-04-04: 5 mL via INTRADERMAL

## 2023-04-04 MED ORDER — MIDAZOLAM HCL 2 MG/2ML IJ SOLN
INTRAMUSCULAR | Status: AC | PRN
Start: 2023-04-04 — End: 2023-04-04
  Administered 2023-04-04 (×2): .5 mg via INTRAVENOUS
  Administered 2023-04-04: 1 mg via INTRAVENOUS
  Administered 2023-04-04: .5 mg via INTRAVENOUS

## 2023-04-04 NOTE — Consult Note (Signed)
Chief Complaint: History of PE & DVT admitted for left parietoccipital hematoma. Request is for IVC filter placement  Referring Physician(s): Josetta Huddle NP  Supervising Physician: Richarda Overlie  Patient Status: [facility] - In-pt  History of Present Illness: Mark Cohen is a 72 y.o. male inpatient. History of a fib, HTN, HLD, recently diagnosed PE and DVT (on xarelto) presented to the ED at [facility]  on 9.23.24 with slurred speech . Found to have an enlarged d slightly larger hematoma in the left parietooccipital hematoma on CT 9.24.24. CT Angio chest from 9.20.24 shows PE. Venous Doppler from 9.21.24 shows right posterior tibial vein, right peroneal vein DVT and left gastrocnemius  vein DVT.  Team is requesting an IVC filter placement.  Past Medical History:  Diagnosis Date   Allergic rhinitis    Atrial fibrillation (HCC)    Colon polyps    Epididymal cyst    right   GERD (gastroesophageal reflux disease)    Hepatitis 1961   hepatitis a as child, no liver oproblems since   Hip pain    both    Past Surgical History:  Procedure Laterality Date   APPENDECTOMY     colonscopy  07/27/2016   EPIDIDYMECTOMY  08/29/2002   Sees Dr Patsi Sears twice a year left side   EPIDIDYMECTOMY Right 09/09/2016   Procedure: EPIDIDYMECTOMY;  Surgeon: Jethro Bolus, MD;  Location: [facility] ORS;  Service: Urology;  Laterality: Right;   NASAL SINUS SURGERY  2008    Allergies: Patient has no known allergies.  Medications: Prior to Admission medications   Medication Sig Start Date End Date Taking? Authorizing Provider  ALPRAZolam (XANAX) 0.25 MG tablet Take 1 tablet (0.25 mg total) by mouth 2 (two) times daily as needed. Patient taking differently: Take 0.25 mg by mouth 2 (two) times daily as needed for anxiety. 03/30/23  Yes Etta Grandchild, MD  Cholecalciferol (DIALYVITE VITAMIN D 5000 PO) Take 1 tablet by mouth daily.   Yes [provider]  diltiazem (CARDIZEM CD) 120 MG 24 hr capsule  Take 1 capsule (120 mg total) by mouth daily. 03/29/23  Yes Etta Grandchild, MD  esomeprazole (NEXIUM) 40 MG capsule TAKE 1 CAPSULE BY MOUTH DAILY 10/02/22  Yes Etta Grandchild, MD  HYDROcodone-acetaminophen (NORCO) 10-325 MG tablet Take 1 tablet by mouth every 8 (eight) hours. Patient taking differently: Take 1 tablet by mouth every 6 (six) hours as needed for moderate pain. 03/30/23  Yes Etta Grandchild, MD  Rivaroxaban (XARELTO) 15 MG TABS tablet Take 15 mg by mouth 2 (two) times daily with a meal.   Yes [provider]     Family History  Problem Relation Age of Onset   Arthritis Unknown    Stroke Unknown    Cancer Neg Hx     Social History   Socioeconomic History   Marital status: Married    Spouse name: Not on file   Number of children: Not on file   Years of education: Not on file   Highest education level: Bachelor's degree (e.g., BA, AB, BS)  Occupational History   Occupation: Retired  Tobacco Use   Smoking status: Former    Current packs/day: 1.00    Average packs/day: 1 pack/day for 7.0 years (7.0 ttl pk-yrs)    Types: Cigarettes    Passive exposure: Never   Smokeless tobacco: Never  Substance and Sexual Activity   Alcohol use: Yes    Alcohol/week: 21.0 standard drinks of alcohol    Types:  21 Cans of beer per week   Drug use: No   Sexual activity: Not Currently    Partners: Female  Other Topics Concern   Not on file  Social History Narrative   Regular Exercise -  YES         Social Determinants of Health   Financial Resource Strain: Low Risk  (03/28/2023)   Overall Financial Resource Strain (CARDIA)    Difficulty of Paying Living Expenses: Not hard at all  Food Insecurity: No Food Insecurity (03/31/2023)   Hunger Vital Sign    Worried About Running Out of Food in the Last Year: Never true    Ran Out of Food in the Last Year: Never true  Transportation Needs: No Transportation Needs (03/31/2023)   PRAPARE - Administrator, Civil Service  (Medical): No    Lack of Transportation (Non-Medical): No  Physical Activity: Sufficiently Active (03/28/2023)   Exercise Vital Sign    Days of Exercise per Week: 5 days    Minutes of Exercise per Session: 60 min  Stress: No Stress Concern Present (03/28/2023)   [employer] of Occupational Health - Occupational Stress Questionnaire    Feeling of Stress : Not at all  Social Connections: Moderately Isolated (03/28/2023)   Social Connection and Isolation Panel [NHANES]    Frequency of Communication with Friends and Family: More than three times a week    Frequency of Social Gatherings with Friends and Family: Three times a week    Attends Religious Services: Never    Active Member of Clubs or Organizations: No    Attends Engineer, structural: Not on file    Marital Status: Married    Review of Systems: A 12 point ROS discussed and pertinent positives are indicated in the HPI above.  All other systems are negative.  Review of Systems  Constitutional:  Negative for fever.  HENT:  Negative for congestion.   Eyes:  Positive for visual disturbance.  Respiratory:  Negative for cough and shortness of breath.   Cardiovascular:  Negative for chest pain.  Gastrointestinal:  Negative for abdominal pain.  Neurological:  Negative for headaches.  Psychiatric/Behavioral:  Negative for behavioral problems and confusion.     Vital Signs: BP (!) 140/98   Pulse 78   Temp 99.1 F (37.3 C) (Oral)   Resp 14   Ht 6' (1.829 m)   Wt 165 lb 9.1 oz (75.1 kg)   SpO2 98%   BMI 22.45 kg/m   Advance Care Plan: The advanced care plan/surrogate decision maker was discussed at the time of visit and documented in the medical record.    Physical Exam Vitals and nursing note reviewed.  Constitutional:      Appearance: He is well-developed.  HENT:     Head: Normocephalic.  Cardiovascular:     Rate and Rhythm: Normal rate. Rhythm irregular.     Heart sounds: Normal heart sounds.  Pulmonary:      Effort: Pulmonary effort is normal.  Musculoskeletal:        General: Normal range of motion.     Cervical back: Normal range of motion.  Skin:    General: Skin is dry.  Neurological:     Mental Status: He is alert and oriented to person, place, and time.     Comments:  Reporting visual issues  Psychiatric:        Mood and Affect: Mood normal.        Behavior: Behavior normal.  Thought Content: Thought content normal.     Imaging: MR BRAIN W WO CONTRAST  Result Date: 04/04/2023 CLINICAL DATA:  Headache and confusion. Hypertension. Trouble sing at of the right eye. 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 EXAM: MRI HEAD WITHOUT AND WITH CONTRAST TECHNIQUE: Multiplanar, multiecho pulse sequences of the brain and surrounding structures were obtained without and with intravenous contrast. CONTRAST:  7.35mL GADAVIST GADOBUTROL 1 MMOL/ML IV SOLN COMPARISON:  Head CT from earlier today FINDINGS: Brain: Known acute hematoma in the left parietal and occipital region which extends to the brain surface. There are numerous chronic microhemorrhages scattered along the cerebral convexities, although more asymmetric to the left cerebral hemisphere. 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 Mild for age chronic small vessel ischemic type change in the cerebral white matter. The rim of moderate vasogenic edema surrounds the hematoma. No underlying infarct is detected. No evidence of mass lesion. Vascular: Major flow voids and vascular enhancements are preserved Skull and upper cervical spine: Normal marrow signal Sinuses/Orbits: Negative IMPRESSION: No infarct or mass seen underlying the patient's left parieto-occipital hematoma. There are chronic superficial micro-hemorrhages and mild superficial siderosis suggesting amyloid angiopathy. Electronically Signed   By: Tiburcio Pea M.D.   On: 04/04/2023 10:39   CT HEAD WO CONTRAST ( )  Result  Date: 04/04/2023 CLINICAL DATA:  Stroke follow-up EXAM: CT HEAD WITHOUT CONTRAST TECHNIQUE: Contiguous axial images were obtained from the base of the skull through the vertex without intravenous contrast. RADIATION DOSE REDUCTION: This exam was performed according to the departmental dose-optimization program which includes automated exposure control, adjustment of the mA and/or kV according to patient size and/or use of iterative reconstruction technique. COMPARISON:  Head CT from yesterday FINDINGS: Brain: More homogeneously dense and slightly larger hematoma in the left parietooccipital region which measures 5 cm craniocaudal as compared to 4.4 cm yesterday. Adjacent edema and local mass effect is similar. No new area of bleeding. No hydrocephalus. Mild chronic white matter disease. Vascular: No hyperdense vessel or unexpected calcification. Skull: Normal. Negative for fracture or focal lesion. Sinuses/Orbits: No acute finding. IMPRESSION: More homogeneously dense and slightly fuller left cerebral ICH but similar degree of local mass effect. Electronically Signed   By: Tiburcio Pea M.D.   On: 04/04/2023 06:15   CT ANGIO HEAD NECK W WO CM (CODE STROKE)  Result Date: 04/03/2023 CLINICAL DATA:  Neuro deficit, acute, stroke suspected. Intracranial hemorrhage. EXAM: CT ANGIOGRAPHY HEAD AND NECK WITH AND WITHOUT CONTRAST TECHNIQUE: Multidetector CT imaging of the head and neck was performed using the standard protocol during bolus administration of intravenous contrast. Multiplanar CT image reconstructions and MIPs were obtained to evaluate the vascular anatomy. Carotid stenosis measurements (when applicable) are obtained utilizing NASCET criteria, using the distal internal carotid diameter as the denominator. RADIATION DOSE REDUCTION: This exam was performed according to the departmental dose-optimization program which includes automated exposure control, adjustment of the mA and/or kV according to patient  size and/or use of iterative reconstruction technique. CONTRAST:  75mL OMNIPAQUE IOHEXOL 350 MG/ML SOLN COMPARISON:  Head CT 04/03/2023. FINDINGS: CTA NECK FINDINGS Aortic arch: Incompletely imaged. Visualized portions the proximal arch vessels are patent. Right carotid system: No evidence of dissection, stenosis (50% or greater), or occlusion. Left carotid system: No evidence of dissection, stenosis (50% or greater), or occlusion. Vertebral arteries: Codominant. No evidence of dissection, stenosis (50% or greater), or occlusion. Skeleton: Mild cervical spondylosis without high-grade spinal canal stenosis. Other neck: Unremarkable. Upper chest: Unremarkable.  Review of the MIP images confirms the above findings CTA HEAD FINDINGS Anterior circulation: Intracranial ICAs are patent without stenosis or aneurysm. The proximal ACAs and MCAs are patent without stenosis, aneurysm or vascular malformation. Distal branches are symmetric. Posterior circulation: Normal basilar artery. The SCAs, AICAs and PICAs are patent proximally. The PCAs are patent proximally without stenosis, aneurysm or vascular malformation. Distal branches are symmetric. Venous sinuses: As permitted by contrast timing, patent. Anatomic variants: Hypoplastic right A1 segment. Review of the MIP images confirms the above findings IMPRESSION: No large vessel occlusion, hemodynamically significant stenosis, aneurysm, or vascular malformation in the head or neck. Electronically Signed   By: Orvan Falconer M.D.   On: 04/03/2023 19:41   CT HEAD CODE STROKE WO CONTRAST  Result Date: 04/03/2023 CLINICAL DATA:  Code stroke. Initial evaluation for neuro deficit, stroke suspected. EXAM: CT HEAD WITHOUT CONTRAST TECHNIQUE: Contiguous axial images were obtained from the base of the skull through the vertex without intravenous contrast. RADIATION DOSE REDUCTION: This exam was performed according to the departmental dose-optimization program which includes automated  exposure control, adjustment of the mA and/or kV according to patient size and/or use of iterative reconstruction technique. COMPARISON:  Prior study from 06/03/2005. FINDINGS: Brain: Acute intraparenchymal hematoma centered at the left parieto-occipital convexity measures 5.3 x 4.0 x 3.0 cm (estimated volume 32 mL). Surrounding vasogenic edema without significant regional mass effect or midline shift. No intraventricular or extra-axial extension. No other acute intracranial hemorrhage. No other large vessel territory infarct. Underlying mild chronic microvascular ischemic disease noted. No midline shift or hydrocephalus. Vascular: No abnormal hyperdense vessel. Skull: Scalp soft tissues and calvarium demonstrate no acute finding. Sinuses/Orbits: Globes and orbital soft tissues within normal limits. Scattered mucosal thickening present about the ethmoidal air cells. No mastoid effusion. Other: None. ASPECTS [facility] Stroke Program Early CT Score) Does not apply, acute ICH. IMPRESSION: 5.3 x 4.0 x 3.0 cm acute intraparenchymal hematoma centered at the left parieto-occipital convexity (estimated volume 32 mL). Surrounding vasogenic edema without significant regional mass effect or midline shift. These results were communicated to Dr. Derry Lory at 7:19 pm on 04/03/2023 by text page via the [facility] messaging system. Electronically Signed   By: Rise Mu M.D.   On: 04/03/2023 19:20   VAS [place] LOWER EXTREMITY VENOUS (DVT)  Result Date: 04/01/2023  Lower Venous DVT Study Patient Name:  Mark Cohen  Date of Exam:   04/01/2023 Medical Rec #: [id number]          Accession #:    [id number] Date of Birth: 02-04-1951         Patient Gender: M Patient Age:   39 years Exam Location:  [facility] Procedure:      VAS [place] LOWER EXTREMITY VENOUS (DVT) Referring Phys: CHING TU --------------------------------------------------------------------------------  Indications: Pulmonary embolism.  Comparison Study: No  prior study on file Performing Technologist: Sherren Kerns RVS  Examination Guidelines: A complete evaluation includes B-mode imaging, spectral Doppler, color Doppler, and power Doppler as needed of all accessible portions of each vessel. Bilateral testing is considered an integral part of a complete examination. Limited examinations for reoccurring indications may be performed as noted. The reflux portion of the exam is performed with the patient in reverse Trendelenburg.  +---------+---------------+---------+-----------+----------+--------------+ RIGHT    CompressibilityPhasicitySpontaneityPropertiesThrombus Aging +---------+---------------+---------+-----------+----------+--------------+ CFV      Full           Yes      Yes                                 +---------+---------------+---------+-----------+----------+--------------+  SFJ      Full           Yes      Yes                                 +---------+---------------+---------+-----------+----------+--------------+ FV Prox  Full           Yes      Yes                                 +---------+---------------+---------+-----------+----------+--------------+ FV Mid   Full           Yes      Yes                                 +---------+---------------+---------+-----------+----------+--------------+ FV DistalFull           Yes      Yes                                 +---------+---------------+---------+-----------+----------+--------------+ PFV      Full           Yes      Yes                                 +---------+---------------+---------+-----------+----------+--------------+ POP      Full           Yes      Yes                                 +---------+---------------+---------+-----------+----------+--------------+ PTV      None           No       No                   Acute          +---------+---------------+---------+-----------+----------+--------------+ PERO     None            No       No                   Acute          +---------+---------------+---------+-----------+----------+--------------+ Gastroc  Full           Yes      Yes                                 +---------+---------------+---------+-----------+----------+--------------+   +---------+---------------+---------+-----------+----------+--------------+ LEFT     CompressibilityPhasicitySpontaneityPropertiesThrombus Aging +---------+---------------+---------+-----------+----------+--------------+ CFV      Full           Yes      Yes                                 +---------+---------------+---------+-----------+----------+--------------+ SFJ      Full           Yes      Yes                                 +---------+---------------+---------+-----------+----------+--------------+  FV Prox  Full           Yes      Yes                                 +---------+---------------+---------+-----------+----------+--------------+ FV Mid   Full           Yes      Yes                                 +---------+---------------+---------+-----------+----------+--------------+ FV DistalFull           Yes      Yes                                 +---------+---------------+---------+-----------+----------+--------------+ PFV      Full           Yes      Yes                                 +---------+---------------+---------+-----------+----------+--------------+ POP      Full           Yes      Yes                                 +---------+---------------+---------+-----------+----------+--------------+ PTV      Full           Yes      Yes                                 +---------+---------------+---------+-----------+----------+--------------+ PERO     Full           Yes      Yes                                 +---------+---------------+---------+-----------+----------+--------------+ Gastroc  None           No       No                   Acute           +---------+---------------+---------+-----------+----------+--------------+     Summary: RIGHT: - Findings consistent with acute deep vein thrombosis involving the right posterior tibial veins, and right peroneal veins.  - No cystic structure found in the popliteal fossa.  LEFT: Findings consistent with acute intramuscular thrombosis involving the left gastrocnemius veins. - No cystic structure found in the popliteal fossa.  *See table(s) above for measurements and observations. Electronically signed by Coral Else MD on 04/01/2023 at 8:39:16 PM.    Final    ECHOCARDIOGRAM COMPLETE  Result Date: 04/01/2023    ECHOCARDIOGRAM REPORT   Patient Name:   Mark Cohen Date of Exam: 04/01/2023 Medical Rec #:  [id number]         Height:       72.0 in Accession #:    [id number]        Weight:       162.7 lb Date of Birth:  October 05, 1950  BSA:          1.952 m Patient Age:    71 years          BP:           140/93 mmHg Patient Gender: M                 HR:           75 bpm. Exam Location:  Inpatient Procedure: 2D Echo, Cardiac Doppler and Color Doppler Indications:    Pulmonary Embolus I26.09  History:        Patient has no prior history of Echocardiogram examinations.                 Arrythmias:Atrial Fibrillation; Risk Factors:Hypertension and                 Former Smoker.  Sonographer:    Aron Baba Referring Phys: [id number] CHRISTOPHER P DANFORD IMPRESSIONS  1. Left ventricular ejection fraction, by estimation, is 55 to 60%. The left ventricle has normal function. The left ventricle has no regional wall motion abnormalities. Left ventricular diastolic parameters are indeterminate.  2. Right ventricular systolic function is normal. The right ventricular size is normal. There is normal pulmonary artery systolic pressure. The estimated right ventricular systolic pressure is 21.4 mmHg.  3. Right atrial size was mildly dilated.  4. A small pericardial effusion is present. The pericardial effusion is  circumferential.  5. The mitral valve is grossly normal. Mild mitral valve regurgitation.  6. The aortic valve is tricuspid. Aortic valve regurgitation is not visualized. No aortic stenosis is present.  7. Aortic dilatation noted. There is mild dilatation of the ascending aorta, measuring 40 mm.  8. The inferior vena cava is dilated in size with >50% respiratory variability, suggesting right atrial pressure of 8 mmHg. Comparison(s): No prior Echocardiogram. FINDINGS  Left Ventricle: Left ventricular ejection fraction, by estimation, is 55 to 60%. The left ventricle has normal function. The left ventricle has no regional wall motion abnormalities. The left ventricular internal cavity size was normal in size. There is  no left ventricular hypertrophy. Left ventricular diastolic parameters are indeterminate. Right Ventricle: The right ventricular size is normal. No increase in right ventricular wall thickness. Right ventricular systolic function is normal. There is normal pulmonary artery systolic pressure. The tricuspid regurgitant velocity is 1.83 m/s, and  with an assumed right atrial pressure of 8 mmHg, the estimated right ventricular systolic pressure is 21.4 mmHg. Left Atrium: Left atrial size was normal in size. Right Atrium: Right atrial size was mildly dilated. Pericardium: A small pericardial effusion is present. The pericardial effusion is circumferential. Mitral Valve: The mitral valve is grossly normal. Mild mitral valve regurgitation. Tricuspid Valve: The tricuspid valve is grossly normal. Tricuspid valve regurgitation is trivial. Aortic Valve: The aortic valve is tricuspid. There is mild aortic valve annular calcification. Aortic valve regurgitation is not visualized. No aortic stenosis is present. Pulmonic Valve: The pulmonic valve was grossly normal. Pulmonic valve regurgitation is trivial. Aorta: The aortic root is normal in size and structure and aortic dilatation noted. There is mild dilatation of  the ascending aorta, measuring 40 mm. Venous: The inferior vena cava is dilated in size with greater than 50% respiratory variability, suggesting right atrial pressure of 8 mmHg. IAS/Shunts: No atrial level shunt detected by color flow Doppler.  LEFT VENTRICLE PLAX 2D LVIDd:         4.90 cm     Diastology LVIDs:  3.50 cm     LV e' medial:    7.18 cm/s LV PW:         0.90 cm     LV E/e' medial:  8.6 LV IVS:        0.70 cm     LV e' lateral:   8.16 cm/s LVOT diam:     2.00 cm     LV E/e' lateral: 7.6 LV SV:         56 LV SV Index:   29 LVOT Area:     3.14 cm  LV Volumes (MOD) LV vol d, MOD A4C: 97.5 ml LV vol s, MOD A4C: 47.0 ml LV SV MOD A4C:     97.5 ml RIGHT VENTRICLE RV S prime:     14.90 cm/s TAPSE (M-mode): 2.5 cm LEFT ATRIUM             Index        RIGHT ATRIUM           Index LA diam:        3.50 cm 1.79 cm/m   RA Area:     21.80 cm LA Vol (A2C):   62.7 ml 32.13 ml/m  RA Volume:   71.90 ml  36.84 ml/m LA Vol (A4C):   51.8 ml 26.54 ml/m LA Biplane Vol: 58.6 ml 30.03 ml/m  AORTIC VALVE             PULMONIC VALVE LVOT Vmax:   87.00 cm/s  PR End Diast Vel: 2.74 msec LVOT Vmean:  55.700 cm/s LVOT VTI:    0.179 m  AORTA Ao Root diam: 3.60 cm Ao Asc diam:  4.00 cm MITRAL VALVE               TRICUSPID VALVE MV Area (PHT): 5.13 cm    TR Peak grad:   13.4 mmHg MV Decel Time: 148 msec    TR Vmax:        183.00 cm/s MR Peak grad: 107.1 mmHg MR Vmax:      517.50 cm/s  SHUNTS MV E velocity: 62.10 cm/s  Systemic VTI:  0.18 m MV A velocity: 40.30 cm/s  Systemic Diam: 2.00 cm MV E/A ratio:  1.54 Nona Dell MD Electronically signed by Nona Dell MD Signature Date/Time: 04/01/2023/2:07:30 PM    Final    CT Angio Chest W/Cm &/Or Wo Cm  Result Date: 03/31/2023 CLINICAL DATA:  Chronic shortness of breath EXAM: CT ANGIOGRAPHY CHEST WITH CONTRAST TECHNIQUE: Multidetector CT imaging of the chest was performed using the standard protocol during bolus administration of intravenous contrast. Multiplanar CT  image reconstructions and MIPs were obtained to evaluate the vascular anatomy. RADIATION DOSE REDUCTION: This exam was performed according to the departmental dose-optimization program which includes automated exposure control, adjustment of the mA and/or kV according to patient size and/or use of iterative reconstruction technique. CONTRAST:  75mL ISOVUE-370 IOPAMIDOL (ISOVUE-370) INJECTION 76% COMPARISON:  None Available. FINDINGS: Cardiovascular: Pulmonary embolus of the right upper lobe lobar artery and additional sites of pulmonary embolus seen in bilateral subsegmental pulmonary arteries. Borderline elevated RV to LV ratio of 1.07. Normal heart size. Small pericardial effusion. Normal caliber thoracic aorta with mild atherosclerotic disease. Mediastinum/Nodes: No enlarged mediastinal, hilar, or axillary lymph nodes. Thyroid and esophagus are unremarkable. Lungs/Pleura: Central airways are patent. Mild biapical pleural-parenchymal scarring. No consolidation, pleural effusion or pneumothorax. Upper Abdomen: No acute abnormality. Musculoskeletal: No chest wall abnormality. No acute or significant osseous findings. Review of the MIP  images confirms the above findings. IMPRESSION: 1. Pulmonary embolus of the right upper lobe lobar artery and additional sites of pulmonary embolus seen in bilateral subsegmental pulmonary arteries. 2. Borderline elevated RV to LV ratio of 1.07, suggestive of right heart strain. Correlate with echocardiography. 3. Small pericardial effusion. 4. Mild aortic Atherosclerosis (ICD10-I70.0). Critical Value/emergent results were called by telephone at the time of interpretation on 03/31/2023 at 3:11 pm to clinical nurse supervisor Gordy Levan, who verbally acknowledged these results. Electronically Signed   By: Allegra Lai M.D.   On: 03/31/2023 15:12    Labs:  CBC: Recent Labs    03/31/23 1050 03/31/23 1740 04/01/23 0201 04/03/23 1907  WBC 7.6 7.2 6.3 7.2  HGB 14.0 12.8*  11.8* 13.7  13.3  HCT 42.3 38.5* 34.8* 40.4  39.0  PLT 373.0 353 290 324    COAGS: Recent Labs    03/31/23 1740 04/01/23 0201 04/03/23 1907  INR 1.9*  --  2.0*  APTT  --  76* 40*    BMP: Recent Labs    09/05/22 1617 03/31/23 1050 03/31/23 1740 04/03/23 1907  NA 139 138 137 138  139  K 4.5 4.2 4.0 4.0  3.8  CL 103 101 102 106  105  CO2 31 30 26 23   GLUCOSE 85 99 95 109*  102*  BUN 13 10 10 11  12   CALCIUM 9.9 9.6 9.2 9.1  CREATININE 0.88 0.84 0.89 0.89  0.80  GFRNONAA  --   --  >60 >60    LIVER FUNCTION TESTS: Recent Labs    02/03/23 1140 04/03/23 1907  BILITOT 0.7 0.9  AST 19 26  ALT 14 16  ALKPHOS 79 76  PROT 6.7 6.8  ALBUMIN 4.6 3.7    Assessment and Plan:  72 y.o. male inpatient. History of a fib, HTN, HLD, recently diagnosed PE and DVT (on xarelto) presented to the ED at [facility]  on 9.23.24 with slurred speech . Found to have an enlarged d slightly larger hematoma in the left parietooccipital hematoma on CT 9.24.24. CT Angio chest from 9.20.24 shows PE. Venous Doppler from 9.21.24 shows right posterior tibial vein, right peroneal vein DVT and left gastrocnemius  vein DVT.  Team is requesting an IVC filter placement.  Patient currently on pressors. All labs and medications are within acceptable parameters. NKDA. Patient last ate @ 30 minutes ago..   Risks and benefits discussed with the patient including, but not limited to bleeding, infection, contrast induced renal failure, filter fracture or migration which can lead to emergency surgery or even death, strut penetration with damage or irritation to adjacent structures and caval thrombosis. All of the patient's questions were answered, patient is agreeable to proceed.  Consent signed and in chart.   Thank you for this interesting consult.  I greatly enjoyed meeting Mark Cohen and look forward to participating in their care.  A copy of this report was sent to the requesting provider on this  date.  Electronically Signed: Alene Mires, NP 04/04/2023, 12:21 PM   I spent a total of 40 Minutes    in face to face in clinical consultation, greater than 50% of which was counseling/coordinating care for IVC filter placement

## 2023-04-04 NOTE — Procedures (Signed)
Pre procedural Dx: DVT/PE with contraindication to anticoagulation Post Procedural Dx: Same  Successful placement of an infrarenal IVC filter via the right internal jugular vein.  EBL: Trace Complications: The initial IVC filter was defective with it's legs crossed resulting in filter tilting, requiring removal of the initial filter and placement of a new appropriately positioned IVC filter.   Katherina Right, MD Pager #: [phone]

## 2023-04-04 NOTE — Progress Notes (Signed)
eLink Physician-Brief Progress Note Patient Name: BONIFACIO WOLNER DOB: 30-Mar-1951 MRN: [id number]   Date of Service  04/04/2023  HPI/Events of Note  72 y.o. male arriving to [facility]  via [facility] on 9/23 with past medical hx of pulmonary embolus, Afib,GERD, HTN, HLD, anxiety. On Xarelto (rivaroxaban) daily.  He was initially complaining of vision changes and slurred speech and was found to have intracranial hemorrhage at the left parieto-occipital convexity with surrounding vasogenic edema.  Xarelto was reversed with adexanet and the patient was started on strict blood pressure control on Cleviprex.  On examination the patient is normotensive with mild hypoxemia at 89% on room air.  Examination consistent with GCS 15 moving upper and lower extremities independently.  Metabolic panel and CBC essentially within normal limits.  INR mildly elevated at 2.  Urine drug screen positive for marijuana and benzodiazepines.  CT head personally reviewed with evidence of an intracranial hemorrhage without extraventricular extension.  ICH score-1.    eICU Interventions  Strict blood pressure control with systolic goal less than 150.  Repeat CT head pending at 6-hour interval.  Stroke workup pending.  MRI, echo, laboratory studies for restratification.  SCDs for DVT prophylaxis. GI prophylaxis with pantoprazole.     Intervention Category Evaluation Type: New Patient Evaluation  Tanesia Butner 04/04/2023, 12:03 AM

## 2023-04-04 NOTE — Evaluation (Signed)
Physical Therapy Evaluation Patient Details Name: Mark Cohen MRN: [id number] DOB: [birth year] Today's Date: 04/04/2023  History of Present Illness  Pt is 72 yo male who presents on 04/03/23 with slurred speech and found to have L parieto-occipital ICH. Noted dc on 9/21 with PE, on Xarelto. PMH: PE (new), Afib (new),GERD, HTN, HLD, anxiety, CN4 palsy  Clinical Impression  Pt admitted with above diagnosis. Pt from home with his wife where he is physically active, independent, driving. 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 Pt mobilizing at min A level for safety. Mobility limited on eval due to pt's BP at allowable threshold. Will advance next session if BP more stable. Recommend home with outpt PT.  Pt currently with functional limitations due to the deficits listed below (see PT Problem List). Pt will benefit from acute skilled PT to increase their independence and safety with mobility to allow discharge.           If plan is discharge home, recommend the following: A little help with walking and/or transfers;Assist for transportation;Supervision due to cognitive status;Assistance with cooking/housework   Can travel by private vehicle        Equipment Recommendations None recommended by PT  Recommendations for Other Services       Functional Status Assessment Patient has had a recent decline in their functional status and demonstrates the ability to make significant improvements in function in a reasonable and predictable amount of time.     Precautions / Restrictions Precautions Precautions: Fall Precaution Comments: BP <150, R visual deficits Restrictions Weight Bearing Restrictions: No      Mobility  Bed Mobility Overal bed mobility: Needs Assistance Bed Mobility: Supine to Sit, Sit to Supine     Supine to sit: Contact guard Sit to supine: Supervision   General bed mobility  comments: no physical assist required    Transfers Overall transfer level: Needs assistance Equipment used: None Transfers: Sit to/from Stand Sit to Stand: Min assist           General transfer comment: for safety    Ambulation/Gait Ambulation/Gait assistance: Min assist Gait Distance (Feet): 10 Feet Assistive device: None Gait Pattern/deviations: Step-through pattern Gait velocity: decreased Gait velocity interpretation: <1.8 ft/sec, indicate of risk for recurrent falls   General Gait Details: pt able to step fwd and bkwd without LOB. Did not progress ambulation out of room due to pt at top of BP range indicated by MD  Stairs            Wheelchair Mobility     Tilt Bed    Modified Rankin (Stroke Patients Only) Modified Rankin (Stroke Patients Only) Pre-Morbid Rankin Score: No symptoms Modified Rankin: Slight disability     Balance Overall balance assessment: Needs assistance Sitting-balance support: No upper extremity supported, Feet supported Sitting balance-Leahy Scale: Good     Standing balance support: No upper extremity supported, During functional activity Standing balance-Leahy Scale: Fair   Single Leg Stance - Right Leg: 0.5 Single Leg Stance - Left Leg: 0.5     Rhomberg - Eyes Opened: 1 Rhomberg - Eyes Closed: 1                 Pertinent Vitals/Pain Pain Assessment Pain Assessment: No/denies pain    Home Living Family/patient expects to be discharged to:: Private residence Living Arrangements: Spouse/significant other Available Help at Discharge: Family;Available 24 hours/day Type of  Home: House Home Access: Stairs to enter Entrance Stairs-Rails: None Secretary/administrator of Steps: 3   Home Layout: One level Home Equipment: Cane - single point Additional Comments: wife and pt both independent    Prior Function Prior Level of Function : Independent/Modified Independent;Driving                      Extremity/Trunk Assessment   Upper Extremity Assessment Upper Extremity Assessment: Defer to OT evaluation    Lower Extremity Assessment Lower Extremity Assessment: Overall WFL for tasks assessed    Cervical / Trunk Assessment Cervical / Trunk Assessment: Normal  Communication   Communication Communication: No apparent difficulties  Cognition Arousal: Alert Behavior During Therapy: WFL for tasks assessed/performed Overall Cognitive Status: Impaired/Different from baseline Area of Impairment: Awareness, Problem solving                           Awareness: Emergent Problem Solving: Slow processing, Requires verbal cues General Comments: pt alert and oriented, following commands but demonstrates some decreased problem solving and awareness.  Some spatial deficits and R visual deficits. Potentially a bit of denial of symptoms due to pt's desire to be ok and return to independence        General Comments General comments (skin integrity, edema, etc.): Pt 157/96 in sitting, SBP 148 in standing, BP 152/101 after return to supine. HR 78 bpm, SPO2 97%. Had R visual deficits PTA but now with field cut in addition to this.    Exercises     Assessment/Plan    PT Assessment Patient needs continued PT services  PT Problem List Decreased cognition;Decreased mobility;Other (comment) (visual deficits)       PT Treatment Interventions Gait training;Stair training;Functional mobility training;Therapeutic activities;Therapeutic exercise;Balance training;Patient/family education;Cognitive remediation;Neuromuscular re-education    PT Goals (Current goals can be found in the Care Plan section)  Acute Rehab PT Goals Patient Stated Goal: return home PT Goal Formulation: With patient Time For Goal Achievement: 04/18/23 Potential to Achieve Goals: Good    Frequency Min 1X/week     Co-evaluation               AM-PAC PT "6 Clicks" Mobility  Outcome Measure Help  needed turning from your back to your side while in a flat bed without using bedrails?: None Help needed moving from lying on your back to sitting on the side of a flat bed without using bedrails?: None Help needed moving to and from a bed to a chair (including a wheelchair)?: None Help needed standing up from a chair using your arms (e.g., wheelchair or bedside chair)?: A Little Help needed to walk in hospital room?: A Little Help needed climbing 3-5 steps with a railing? : A Little 6 Click Score: 21    End of Session Equipment Utilized During Treatment: Gait belt Activity Tolerance: Patient tolerated treatment well Patient left: in bed;with bed alarm set;with family/visitor present;with call bell/phone within reach Nurse Communication: Mobility status PT Visit Diagnosis: Other symptoms and signs involving the nervous system (R29.898)    Time: 9604-5409 PT Time Calculation (min) (ACUTE ONLY): 20 min   Charges:   PT Evaluation $PT Eval Moderate Complexity: 1 Mod   PT General Charges $$ ACUTE PT VISIT: 1 Visit         Lyanne Co, PT  Acute Rehab Services Secure chat preferred Office [phone]   Lawana Chambers Timmothy Baranowski 04/04/2023, 2:39 PM

## 2023-04-04 NOTE — Progress Notes (Signed)
OT Cancellation Note  Patient Details Name: SYLVIN RICHCREEK MRN: [id number] DOB: [birth year]   Cancelled Treatment:    Reason Eval/Treat Not Completed: Active bedrest order- will check back and see as appropriate/able.   Barry Brunner, OT Acute Rehabilitation Services Office [phone]   Chancy Milroy 04/04/2023, 11:02 AM

## 2023-04-04 NOTE — Progress Notes (Addendum)
Pt w/ BP >150. Dr. Sandria Bales informed and prn anti-hypertensive med requested at this time.    See new prn BP med order in [facility].

## 2023-04-04 NOTE — Sedation Documentation (Signed)
Handoff received from [place], [place]. 2mg  of Versed transition to nurse.

## 2023-04-04 NOTE — Evaluation (Addendum)
Occupational Therapy Evaluation Patient Details Name: Mark Cohen MRN: [id number] DOB: [birth year] Today's Date: 04/04/2023   History of Present Illness Pt is 72 yo male who presents on 04/03/23 with slurred speech and found to have L parieto-occipital ICH. Noted dc on 9/21 with PE, on Xarelto. PMH: PE, Afib,GERD, HTN, HLD, anxiety   Clinical Impression   PTA patient independent and driving. Admitted for above and presents with problem list below, including impaired R sided vision, decreased spatial orientation, mild unsteadiness, decreaed activity tolerance and impaired problem solving.  Noted prior to eval bedrest cleared, and verbal order from MD to participate in OT. He is able to complete bed mobility with min guard, transfers with min assist, and ADLs with up to min assist.  He appears to have significant R visual deficit, only seeing pen once at midline; but question if central vision is preserved- further assessment required.  Based on performance today, recommend outpatient neuro OT services at dc and will follow acutely.         If plan is discharge home, recommend the following: A little help with walking and/or transfers;A little help with bathing/dressing/bathroom;Assistance with cooking/housework;Direct supervision/assist for medications management;Direct supervision/assist for financial management;Assist for transportation;Help with stairs or ramp for entrance    Functional Status Assessment  Patient has had a recent decline in their functional status and demonstrates the ability to make significant improvements in function in a reasonable and predictable amount of time.  Equipment Recommendations  BSC/3in1    Recommendations for Other Services       Precautions / Restrictions Precautions Precautions: Fall Precaution Comments: BP <150, R visual deficits Restrictions Weight Bearing Restrictions: No      Mobility Bed Mobility Overal bed mobility: Needs  Assistance Bed Mobility: Supine to Sit     Supine to sit: Contact guard     General bed mobility comments: no physical assist required    Transfers Overall transfer level: Needs assistance Equipment used: None Transfers: Sit to/from Stand Sit to Stand: Min assist           General transfer comment: for safety      Balance Overall balance assessment: Needs assistance Sitting-balance support: No upper extremity supported, Feet supported Sitting balance-Leahy Scale: Good     Standing balance support: No upper extremity supported, During functional activity Standing balance-Leahy Scale: Fair                             ADL either performed or assessed with clinical judgement   ADL Overall ADL's : Needs assistance/impaired     Grooming: Minimal assistance;Sitting           Upper Body Dressing : Minimal assistance;Sitting   Lower Body Dressing: Minimal assistance;Sit to/from stand   Toilet Transfer: Minimal assistance           Functional mobility during ADLs: Minimal assistance       Vision Baseline Vision/History: 1 Wears glasses Ability to See in Adequate Light: 1 Impaired Patient Visual Report: No change from baseline (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 (double vision when scanning to R side) so typically turns head, but deneis new visual changes until brought up by friend in room) Vision Assessment?: Yes Eye Alignment: Within Functional Limits Ocular Range of Motion: Within Functional Limits Alignment/Gaze Preference: Within Defined Limits Tracking/Visual Pursuits: Able to track stimulus in all quads without difficulty Visual Fields: Right visual field deficit Additional Comments: pt with R  visual deficits, unable to locate # of fingers on R side upper and lower quadrant and with peripheral vision testing unable to locate pen on R side until gets to midline.  When eyes are tested individually pt reports he can see perfectly fine, and  reports when looking at therapist he can see all parts of her face. Question if central vision is preserved?  Further assessment required.     Perception Perception: Impaired Preception Impairment Details: Spatial orientation     Praxis         Pertinent Vitals/Pain Pain Assessment Pain Assessment: No/denies pain     Extremity/Trunk Assessment Upper Extremity Assessment Upper Extremity Assessment: Overall WFL for tasks assessed   Lower Extremity Assessment Lower Extremity Assessment: Defer to PT evaluation       Communication Communication Communication: No apparent difficulties   Cognition Arousal: Alert Behavior During Therapy: WFL for tasks assessed/performed Overall Cognitive Status: Impaired/Different from baseline Area of Impairment: Awareness, Problem solving                           Awareness: Emergent Problem Solving: Slow processing, Requires verbal cues General Comments: pt alert and oriented, following commands but demonstrates some decreased problem solving and awareness.  Some spatial deficits and R visual deficits.     General Comments  VSS, SBP 157 sitting EOB but 148 standing    Exercises     Shoulder Instructions      Home Living Family/patient expects to be discharged to:: Private residence Living Arrangements: Spouse/significant other Available Help at Discharge: Family;Available 24 hours/day Type of Home: House Home Access: Stairs to enter [employer] of Steps: 3 Entrance Stairs-Rails: None Home Layout: One level     Bathroom Shower/Tub: Producer, television/film/video: Standard     Home Equipment: Cane - single point   Additional Comments: wife and pt both independent      Prior Functioning/Environment Prior Level of Function : Independent/Modified Independent;Driving                        OT Problem List: Decreased strength;Decreased activity tolerance;Impaired vision/perception;Decreased  cognition;Decreased safety awareness;Decreased knowledge of use of DME or AE;Decreased knowledge of precautions      OT Treatment/Interventions: Self-care/ADL training;Therapeutic exercise;DME and/or AE instruction;Balance training;Patient/family education;Visual/perceptual remediation/compensation;Cognitive remediation/compensation;Therapeutic activities    OT Goals(Current goals can be found in the care plan section) Acute Rehab OT Goals Patient Stated Goal: home OT Goal Formulation: With patient Time For Goal Achievement: 04/18/23 Potential to Achieve Goals: Good  OT Frequency: Min 1X/week    Co-evaluation              AM-PAC OT "6 Clicks" Daily Activity     Outcome Measure Help from another person eating meals?: A Little Help from another person taking care of personal grooming?: A Little Help from another person toileting, which includes using toliet, bedpan, or urinal?: A Little Help from another person bathing (including washing, rinsing, drying)?: A Little Help from another person to put on and taking off regular upper body clothing?: A Little Help from another person to put on and taking off regular lower body clothing?: A Little 6 Click Score: 18   End of Session Nurse Communication: Mobility status  Activity Tolerance: Patient tolerated treatment well Patient left: Other (comment) (with PT)  OT Visit Diagnosis: Other abnormalities of gait and mobility (R26.89);Low vision, both eyes (H54.2)  Time: 1111-1130 OT Time Calculation (min): 19 min Charges:  OT General Charges $OT Visit: 1 Visit OT Evaluation $OT Eval Moderate Complexity: 1 Mod  Barry Brunner, OT Acute Rehabilitation Services Office [phone]   Chancy Milroy 04/04/2023, 1:35 PM

## 2023-04-04 NOTE — Progress Notes (Addendum)
STROKE TEAM PROGRESS NOTE   BRIEF HPI [name] is a 72 y.o. male with history of atrial fibrillation, recent PE and multiple DVTs on BLE ultrasound started on Xarelto 2 days prior to presentation, chronic R CN 6 palsy, GERD presenting with task specific apraxia and right hemianopsia.  On hospital arrival, a CT head revealed acute IPH with surrounding vasogenic edema and patient was admitted for further workup.  SIGNIFICANT HOSPITAL EVENTS CT head without contrast 04/03/23: acute left parieto-occipital IPH s/p Andexxa administration  CT stability scan 04/04/23: More homogenously dense and slightly fuller left cerebral SDH but similar degree of local mass effect. CTA 04/03/23: no LVO, hemodynamically significant stenosis, aneurysm, or vascular malformation in the head or neck MRI brain: No infarct or mass seen underlying patient's left parieto-occipital hematoma. There are chronic superficial micro-hemorrhages and mild superficial siderosis suggesting amyloid angiopathy.  INTERIM HISTORY/SUBJECTIVE Patient is sitting up in bed, friend at bedside.  Patient is in no acute distress.  Neurologic exam is stable with ongoing mild residual dysarthria, right homonymous hemianopsia.  Chart review reveals reported paroxysmal atrial fibrillation identified in outpatient PCP office last week in the setting of acute PE though there are no documents for review on EMAR and patient has been in sinus rhythm during recent hospitalizations.  Due to risk of complications from DVT/PE and risk of hemorrhage in the setting of IPH with initiation of anticoagulation, patient will need IVC filter placed. IR consult placed. CT chest, abdomen, pelvis ordered for further malignancy evaluation.   OBJECTIVE  CBC    Component Value Date/Time   WBC 7.2 04/03/2023 1907   RBC 4.49 04/03/2023 1907   HGB 13.3 04/03/2023 1907   HGB 13.7 04/03/2023 1907   HCT 39.0 04/03/2023 1907   HCT 40.4 04/03/2023 1907   PLT 324  04/03/2023 1907   MCV 90.0 04/03/2023 1907   MCH 30.5 04/03/2023 1907   MCHC 33.9 04/03/2023 1907   RDW 12.2 04/03/2023 1907   LYMPHSABS 1.6 04/03/2023 1907   MONOABS 0.4 04/03/2023 1907   EOSABS 0.2 04/03/2023 1907   BASOSABS 0.0 04/03/2023 1907   BMET    Component Value Date/Time   NA 139 04/03/2023 1907   NA 138 04/03/2023 1907   K 3.8 04/03/2023 1907   K 4.0 04/03/2023 1907   CL 105 04/03/2023 1907   CL 106 04/03/2023 1907   CO2 23 04/03/2023 1907   GLUCOSE 102 (H) 04/03/2023 1907   GLUCOSE 109 (H) 04/03/2023 1907   BUN 12 04/03/2023 1907   BUN 11 04/03/2023 1907   CREATININE 0.80 04/03/2023 1907   CREATININE 0.89 04/03/2023 1907   CALCIUM 9.1 04/03/2023 1907   GFRNONAA >60 04/03/2023 1907   IMAGING past 24 hours CT HEAD WO CONTRAST ( )  Result Date: 04/04/2023 CLINICAL DATA:  Stroke follow-up EXAM: CT HEAD WITHOUT CONTRAST TECHNIQUE: Contiguous axial images were obtained from the base of the skull through the vertex without intravenous contrast. RADIATION DOSE REDUCTION: This exam was performed according to the departmental dose-optimization program which includes automated exposure control, adjustment of the mA and/or kV according to patient size and/or use of iterative reconstruction technique. COMPARISON:  Head CT from yesterday FINDINGS: Brain: More homogeneously dense and slightly larger hematoma in the left parietooccipital region which measures 5 cm craniocaudal as compared to 4.4 cm yesterday. Adjacent edema and local mass effect is similar. No new area of bleeding. No hydrocephalus. Mild chronic white matter disease. Vascular: No hyperdense vessel or unexpected calcification. Skull:  Normal. Negative for fracture or focal lesion. Sinuses/Orbits: No acute finding. IMPRESSION: More homogeneously dense and slightly fuller left cerebral ICH but similar degree of local mass effect. Electronically Signed   By: Tiburcio Pea M.D.   On: 04/04/2023 06:15   CT ANGIO HEAD NECK  W WO CM (CODE STROKE)  Result Date: 04/03/2023 CLINICAL DATA:  Neuro deficit, acute, stroke suspected. Intracranial hemorrhage. EXAM: CT ANGIOGRAPHY HEAD AND NECK WITH AND WITHOUT CONTRAST TECHNIQUE: Multidetector CT imaging of the head and neck was performed using the standard protocol during bolus administration of intravenous contrast. Multiplanar CT image reconstructions and MIPs were obtained to evaluate the vascular anatomy. Carotid stenosis measurements (when applicable) are obtained utilizing NASCET criteria, using the distal internal carotid diameter as the denominator. RADIATION DOSE REDUCTION: This exam was performed according to the departmental dose-optimization program which includes automated exposure control, adjustment of the mA and/or kV according to patient size and/or use of iterative reconstruction technique. CONTRAST:  75mL OMNIPAQUE IOHEXOL 350 MG/ML SOLN COMPARISON:  Head CT 04/03/2023. FINDINGS: CTA NECK FINDINGS Aortic arch: Incompletely imaged. Visualized portions the proximal arch vessels are patent. Right carotid system: No evidence of dissection, stenosis (50% or greater), or occlusion. Left carotid system: No evidence of dissection, stenosis (50% or greater), or occlusion. Vertebral arteries: Codominant. No evidence of dissection, stenosis (50% or greater), or occlusion. Skeleton: Mild cervical spondylosis without high-grade spinal canal stenosis. Other neck: Unremarkable. Upper chest: Unremarkable. Review of the MIP images confirms the above findings CTA HEAD FINDINGS Anterior circulation: Intracranial ICAs are patent without stenosis or aneurysm. The proximal ACAs and MCAs are patent without stenosis, aneurysm or vascular malformation. Distal branches are symmetric. Posterior circulation: Normal basilar artery. The SCAs, AICAs and PICAs are patent proximally. The PCAs are patent proximally without stenosis, aneurysm or vascular malformation. Distal branches are symmetric. Venous  sinuses: As permitted by contrast timing, patent. Anatomic variants: Hypoplastic right A1 segment. Review of the MIP images confirms the above findings IMPRESSION: No large vessel occlusion, hemodynamically significant stenosis, aneurysm, or vascular malformation in the head or neck. Electronically Signed   By: Orvan Falconer M.D.   On: 04/03/2023 19:41   CT HEAD CODE STROKE WO CONTRAST  Result Date: 04/03/2023 CLINICAL DATA:  Code stroke. Initial evaluation for neuro deficit, stroke suspected. EXAM: CT HEAD WITHOUT CONTRAST TECHNIQUE: Contiguous axial images were obtained from the base of the skull through the vertex without intravenous contrast. RADIATION DOSE REDUCTION: This exam was performed according to the departmental dose-optimization program which includes automated exposure control, adjustment of the mA and/or kV according to patient size and/or use of iterative reconstruction technique. COMPARISON:  Prior study from 06/03/2005. FINDINGS: Brain: Acute intraparenchymal hematoma centered at the left parieto-occipital convexity measures 5.3 x 4.0 x 3.0 cm (estimated volume 32 mL). Surrounding vasogenic edema without significant regional mass effect or midline shift. No intraventricular or extra-axial extension. No other acute intracranial hemorrhage. No other large vessel territory infarct. Underlying mild chronic microvascular ischemic disease noted. No midline shift or hydrocephalus. Vascular: No abnormal hyperdense vessel. Skull: Scalp soft tissues and calvarium demonstrate no acute finding. Sinuses/Orbits: Globes and orbital soft tissues within normal limits. Scattered mucosal thickening present about the ethmoidal air cells. No mastoid effusion. Other: None. ASPECTS [facility] Stroke Program Early CT Score) Does not apply, acute ICH. IMPRESSION: 5.3 x 4.0 x 3.0 cm acute intraparenchymal hematoma centered at the left parieto-occipital convexity (estimated volume 32 mL). Surrounding vasogenic edema  without significant regional mass effect or midline shift.  These results were communicated to Dr. Derry Lory at 7:19 pm on 04/03/2023 by text page via the [facility] messaging system. Electronically Signed   By: Rise Mu M.D.   On: 04/03/2023 19:20    Vitals:   04/04/23 0600 04/04/23 0611 04/04/23 0700 04/04/23 0800  BP: (!) 141/82  (!) 133/57 137/80  Pulse: 75 83 72 75  Resp: (!) 38 17 17 (!) 21  Temp:      TempSrc:      SpO2: 98% 97% 95% 97%  Weight:      Height:       PHYSICAL EXAM General:  Alert, well-nourished, well-developed Caucasian male in no acute distress Psych:  Mood and affect appropriate for situation, calm and cooperative with exam CV: Regular rate and rhythm on monitor Respiratory:  Regular, unlabored respirations on room air GI: Abdomen soft and nontender  NEURO:  Mental Status: AA&Ox3, patient is able to give clear and coherent history Speech/Language: speech is with minimal dysarthria, no aphasia.  Naming, repetition, fluency, and comprehension intact.  Cranial Nerves:  II: PERRL. Right homonymous hemianopsia. III, IV, VI: Right CN6 palsy, no nystagmis V: Sensation is intact to light touch and symmetrical to face.  VII: Face is symmetrical resting and smiling VIII: hearing intact to voice. IX, X: Palate elevates symmetrically. Phonation is normal.  EA:[id number] shrug 5/5. XII: Tongue is midline without fasciculations. Motor: 5/5 strength to all muscle groups tested.  Tone: is normal and bulk is normal Sensation: Intact to light touch bilaterally. Extinction absent to light touch to DSS.   Coordination: FTN intact bilaterally, HKS: no ataxia in BLE. No drift.  Gait: Deferred  ASSESSMENT/PLAN  Intracerebral Hemorrhage:  left parieto-occipital convexity ICH with surrounding vasogenic edema without significant mass effect or midline shift following Xarelto initiation 2 days PTA  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  versus hypertension  in addition to anticoagulation with Xarelto initiation  Code Stroke CT head 04/03/23: Acute IPH centered at the left parieto-occipital convexity estimated 32 mL.  Surrounding vasogenic edema without significant regional mass effect or midline shift. Repeat CT head 04/04/2023: More homogenously dense and slightly fuller left cerebral ICH but similar degree of local mass effect. CTA head & neck no LVO, hemodynamically significant stenosis, aneurysm, or vascular malformation in the head or neck MRI brain with and without contrast 04/04/2023: No infarct or mass seen underlying the patient's left parieto-occipital hematoma.  There are chronic superficial microhemorrhages and mild superficial siderosis suggesting amyloid angiopathy. Vascular US lower extremity DVT 04/01/23: Findings consistent with acute DVT involving the right posterior tibial veins, and right peroneal veins.  Findings consistent with acute intramuscular thrombosis involving the left gastrocnemius veins.  2D Echo 04/01/23: LVEF 55-60%.  Right atrial size was mildly dilated. Repeat CT head in the AM 04/05/23 Consider watchman device  Will need IVC filter per IR due to risk of hemorrhage with anticoagulation and risk for PE with known DVTs and recent acute PE. IR consult placed. LDL 68 HgbA1c No results found for requested labs within last 1095 days. Pending.  VTE prophylaxis - SCDs due to IPH  Xarelto (rivaroxaban) daily prior to admission, now on No antithrombotic due to IPH Therapy recommendations:  pending PT evaluation, outpatient neuro OT recommended Disposition:  pending   Atrial fibrillation; PAF reported at PCP office in September 2024 without supporting documentation for review Home Meds: Diltiazem, Patient previously on Xarelto, held in the setting of acute IPH Continue telemetry monitoring Hold in the setting of IPH 2  days after Xarelto initiation  Hypertension Home meds:  Diltiazem  Stable Blood Pressure Goal: SBP between  130-150 for 24 hours and then less than 160   Hyperlipidemia Home meds:  none LDL 68, goal < 70 High intensity statin not indicated as patient is at LDL goal without medication at this time  Other Stroke Risk Factors Family hx stroke  Other Active Problems Chronic right cranial nerve 6 palsy   Hospital day # 1  Lanae Boast, AGACNP-BC [facility] Pager: [phone] I have personally obtained history,examined this patient, reviewed notes, independently viewed imaging studies, participated in medical decision making and plan of care.ROS completed by me personally and pertinent positives fully documented  I have made any additions or clarifications directly to the above note. Agree with note above.  Patient presented with vision difficulties with trouble texting likely due to some alexia without a Broca and right-sided hemianopsia secondary to large left parieto-occipital parenchymal hemorrhage 2 days after starting Xarelto load for acute pulmonary embolism.  MRI scan shows several areas of microhemorrhages raising concern for underlying chronic hypertensive microangiopathy versus amyloid angiopathy.  Xarelto was reversed with Andexxa.  Continue close neurological observation and strict blood pressure control with systolic goal 130-150 for the first 24 hours and then below 160.  Urgent placement of IVC filter by interventional radiology as patient will not be on anticoagulation at least for a few weeks.  Patient advised not to drive.  Long discussion with patient and family friend at the bedside and answered questions. This patient is critically ill and at significant risk of neurological worsening, death and care requires constant monitoring of vital signs, hemodynamics,respiratory and cardiac monitoring, extensive review of multiple databases, frequent neurological assessment, discussion with family, other specialists and medical decision making of high complexity.I have made any  additions or clarifications directly to the above note.This critical care time does not reflect procedure time, or teaching time or supervisory time of PA/NP/Med Resident etc but could involve care discussion time.  I spent 30 minutes of neurocritical care time  in the care of  this patient.     Delia Heady, MD Medical Director [facility] Pager: [phone] 04/04/2023 5:42 PM  To contact Stroke Continuity provider, please refer to [email or web address]. After hours, contact General Neurology

## 2023-04-04 NOTE — Progress Notes (Signed)
PT Cancellation Note  Patient Details Name: Mark Cohen MRN: [id number] DOB: 1950/12/04   Cancelled Treatment:    Reason Eval/Treat Not Completed: Active bedrest order. Will check back later today.   Lyanne Co, PT  Acute Rehab Services Secure chat preferred Office [phone]    Lawana Chambers Alyxander Kollmann 04/04/2023, 8:14 AM

## 2023-04-05 ENCOUNTER — Other Ambulatory Visit: Payer: Self-pay | Admitting: Radiology

## 2023-04-05 ENCOUNTER — Encounter: Payer: Self-pay | Admitting: Internal Medicine

## 2023-04-05 ENCOUNTER — Inpatient Hospital Stay (HOSPITAL_COMMUNITY): Payer: Medicare HMO

## 2023-04-05 DIAGNOSIS — I611 Nontraumatic intracerebral hemorrhage in hemisphere, cortical: Secondary | ICD-10-CM

## 2023-04-05 LAB — GLUCOSE, CAPILLARY: Glucose-Capillary: 171 mg/dL — ABNORMAL HIGH (ref 70–99)

## 2023-04-05 NOTE — Evaluation (Signed)
Speech Language Pathology Evaluation Patient Details Name: Mark Cohen MRN: [id number] DOB: Nov 11, 1950 Today's Date: 04/05/2023 Time: 1411-1450 SLP Time Calculation (min) (ACUTE ONLY): 39 min  Problem List:  Patient Active Problem List   Diagnosis Date Noted   ICH (intracerebral hemorrhage) (HCC) 04/03/2023   D-dimer, elevated 03/31/2023   Acute pulmonary embolism (HCC) 03/31/2023   Paroxysmal atrial fibrillation (HCC) 03/31/2023   Elevated troponin 03/31/2023   Dry mouth 03/29/2023   Atrial fibrillation with RVR (HCC) 03/29/2023   Elevated brain natriuretic peptide (BNP) level 03/29/2023   Sacro-iliac pain 02/03/2023   Chronic bilateral low back pain without sciatica 04/07/2022   Chronic pain of both hips 04/07/2022   Gastroesophageal reflux disease without esophagitis 10/12/2021   Polyp of colon 10/12/2021   Plantar fasciitis 04/18/2019   Benign prostatic hyperplasia without lower urinary tract symptoms 04/24/2018   Carpal tunnel syndrome on both sides 06/22/2017   Hypogonadism male 02/09/2017   Hyperlipidemia with target LDL less than 130 01/14/2016   GAD (generalized anxiety disorder) 01/21/2015   DJD (degenerative joint disease), multiple sites 05/10/2013   Left lumbar radiculitis 08/12/2011   Essential hypertension, benign 08/12/2011   Routine general medical examination at a health care facility 04/28/2011   ALLERGIC RHINITIS 01/29/2010   Past Medical History:  Past Medical History:  Diagnosis Date   Allergic rhinitis    Atrial fibrillation (HCC)    Colon polyps    Epididymal cyst    right   GERD (gastroesophageal reflux disease)    Hepatitis 1961   hepatitis a as child, no liver oproblems since   Hip pain    both   Past Surgical History:  Past Surgical History:  Procedure Laterality Date   APPENDECTOMY     colonscopy  07/27/2016   EPIDIDYMECTOMY  08/29/2002   Sees Dr Patsi Sears twice a year left side   EPIDIDYMECTOMY Right 09/09/2016   Procedure:  EPIDIDYMECTOMY;  Surgeon: Jethro Bolus, MD;  Location: [facility];  Service: Urology;  Laterality: Right;   IR IVC FILTER PLMT / S&I /IMG GUID/MOD SED  04/04/2023   IR IVC FILTER RETRIEVAL / S&I /IMG GUID/MOD SED  04/04/2023   NASAL SINUS SURGERY  2008   HPI:  Mark Cohen is a 72 yo male presenting to ED 9/23 with task specific apraxia and R hemianopsia. CTH with L parieto-occipital ICH. PMH includes A-fib, recent PE, chronic R CN VI palsy, GERD   Assessment / Plan / Recommendation Clinical Impression  Pt reports living at home with his wife and completing all ADLs independently. He presents with a moderate cognitive impairment evidenced by his performance on the SLUMS. He scored 12/30 (a score of 27 or above is considered WFL) characterized by deficits related to memory, attention, problem solving, and planning. He could not recall any of the five objects after a delay. He was only able to name seven animals in a one minute divergent naming task with four repetitions. During the clock task, pt with some deficits related to planning. He was also unable to draw the hands of the clock, instead repeatedly writing "10:45" when prompted to set the time to "ten minutes until 11" and given Mod verbal cueing. He appeared to have difficulty locating the shapes and required increased support. Overall, feel that pt is experiencing acute changes related to cognition and may benefit from ongoing SLP f/u on an OP basis to facilitate return to PLOF. Will continue to follow acutely.    SLP Assessment  SLP Recommendation/Assessment: Patient  needs continued Speech Lanaguage Pathology Services SLP Visit Diagnosis: Cognitive communication deficit [phone])    Recommendations for follow up therapy are one component of a multi-disciplinary discharge planning process, led by the attending physician.  Recommendations may be updated based on patient status, additional functional criteria and insurance authorization.     Follow Up Recommendations  Outpatient SLP    Assistance Recommended at Discharge  Frequent or constant Supervision/Assistance  Functional Status Assessment Patient has had a recent decline in their functional status and demonstrates the ability to make significant improvements in function in a reasonable and predictable amount of time.  Frequency and Duration min 2x/week  2 weeks      SLP Evaluation Cognition  Overall Cognitive Status: Impaired/Different from baseline Arousal/Alertness: Awake/alert Orientation Level: Oriented to person;Oriented to place;Oriented to situation;Disoriented to time Attention: Sustained Sustained Attention: Impaired Sustained Attention Impairment: Verbal basic Memory: Impaired Memory Impairment: Retrieval deficit Awareness: Appears intact Problem Solving: Impaired Problem Solving Impairment: Verbal basic       Comprehension  Auditory Comprehension Overall Auditory Comprehension: Appears within functional limits for tasks assessed    Expression Expression Primary Mode of Expression: Verbal Verbal Expression Overall Verbal Expression: Impaired Naming: Impairment Divergent: 25-49% accurate Written Expression Dominant Hand: Right   Oral / Motor  Oral Motor/Sensory Function Overall Oral Motor/Sensory Function: Within functional limits Motor Speech Overall Motor Speech: Appears within functional limits for tasks assessed            Gwynneth Aliment, M.A., CF-SLP Speech Language Pathology, Acute Rehabilitation Services  Secure Chat preferred [phone]  04/05/2023, 3:05 PM

## 2023-04-05 NOTE — Plan of Care (Signed)
  Problem: Education: Goal: Knowledge of disease or condition will improve Outcome: Progressing Goal: Knowledge of secondary prevention will improve (MUST DOCUMENT ALL) Outcome: Progressing Goal: Knowledge of patient specific risk factors will improve Loraine Leriche N/A or DELETE if not current risk factor) Outcome: Progressing   Problem: Intracerebral Hemorrhage Tissue Perfusion: Goal: Complications of Intracerebral Hemorrhage will be minimized Outcome: Progressing   Problem: Coping: Goal: Will verbalize positive feelings about self Outcome: Progressing   Problem: Self-Care: Goal: Ability to participate in self-care as condition permits will improve Outcome: Progressing

## 2023-04-05 NOTE — Progress Notes (Signed)
Physical Therapy Treatment Patient Details Name: Mark Cohen MRN: [id number] DOB: 20-Feb-1951 Today's Date: 04/05/2023   History of Present Illness Pt is 72 yo male who presents on 04/03/23 with slurred speech and found to have L parieto-occipital ICH. Noted dc on 9/21 with PE, on Xarelto. PMH: PE (new), Afib (new),GERD, HTN, HLD, anxiety, CN4 palsy    PT Comments  Pt was able to mobilize better today, not needing any physical assistance or UE support to ambulate. He was able to withstand dynamic gait challenges without LOB but did have difficulty avoiding obstacles on his R, reportedly due to his new visual field cut on the R. Practiced compensatory strategies of scanning his surroundings "like a light house" to avoid obstacles and improve his safety. Noted good carryover. Will continue to follow acutely.    If plan is discharge home, recommend the following: Assist for transportation;Supervision due to cognitive status;Assistance with cooking/housework   Can travel by private vehicle        Equipment Recommendations  None recommended by PT    Recommendations for Other Services       Precautions / Restrictions Precautions Precautions: Fall Precaution Comments: BP <150, R visual deficits Restrictions Weight Bearing Restrictions: No     Mobility  Bed Mobility Overal bed mobility: Needs Assistance Bed Mobility: Supine to Sit, Sit to Supine     Supine to sit: Supervision, HOB elevated Sit to supine: Supervision, HOB elevated   General bed mobility comments: no physical assist required    Transfers Overall transfer level: Needs assistance Equipment used: None Transfers: Sit to/from Stand Sit to Stand: Supervision           General transfer comment: Supervision for safety standing from EOB 1x and from toilet 1x, no LOB    Ambulation/Gait Ambulation/Gait assistance: Contact guard assist, Supervision Gait Distance (Feet): 550 Feet Assistive device: None Gait  Pattern/deviations: Step-through pattern, Decreased step length - right Gait velocity: decreased Gait velocity interpretation: 1.31 - 2.62 ft/sec, indicative of limited community ambulator   General Gait Details: Occasionally turned body with decreased R step length, but overall able to turn, change head positions, and change speeds without LOB. Pt did bump into obstacles on the R 3x initially, but then did not hit any as pt practiced compensatory strategies to scan his surroundings. Progressed from CGA to supervision for safety   Stairs Stairs: Yes Stairs assistance: Contact guard assist Stair Management: Two rails, Alternating pattern, Forwards Number of Stairs: 5 (x3 short steps, x2 standard steps) General stair comments: Ascends and descends without LOB, CGA for safety. Slightly turns his body when descending.   Wheelchair Mobility     Tilt Bed    Modified Rankin (Stroke Patients Only) Modified Rankin (Stroke Patients Only) Pre-Morbid Rankin Score: No symptoms Modified Rankin: Slight disability     Balance Overall balance assessment: Needs assistance Sitting-balance support: No upper extremity supported, Feet supported Sitting balance-Leahy Scale: Good     Standing balance support: No upper extremity supported, During functional activity Standing balance-Leahy Scale: Good                              Cognition Arousal: Alert Behavior During Therapy: WFL for tasks assessed/performed Overall Cognitive Status: Impaired/Different from baseline Area of Impairment: Problem solving                             Problem Solving:  Slow processing, Requires verbal cues General Comments: Some spatial deficits and R visual deficits, but seemingly aware of them as he identified them. Improved compensatory strategies noted with practice        Exercises Other Exercises Other Exercises: visual scanning for various objects while ambulating, pt often stopped  ambulating to scan and search    General Comments        Pertinent Vitals/Pain Pain Assessment Pain Assessment: Faces Faces Pain Scale: Hurts a little bit Pain Location: chronic back pain Pain Descriptors / Indicators: Discomfort Pain Intervention(s): Limited activity within patient's tolerance, Monitored during session    Home Living     Available Help at Discharge: Family;Available 24 hours/day Type of Home: House                  Prior Function            PT Goals (current goals can now be found in the care plan section) Acute Rehab PT Goals Patient Stated Goal: return home PT Goal Formulation: With patient Time For Goal Achievement: 04/18/23 Potential to Achieve Goals: Good Progress towards PT goals: Progressing toward goals    Frequency    Min 1X/week      PT Plan      Co-evaluation              AM-PAC PT "6 Clicks" Mobility   Outcome Measure  Help needed turning from your back to your side while in a flat bed without using bedrails?: None Help needed moving from lying on your back to sitting on the side of a flat bed without using bedrails?: None Help needed moving to and from a bed to a chair (including a wheelchair)?: None Help needed standing up from a chair using your arms (e.g., wheelchair or bedside chair)?: A Little Help needed to walk in hospital room?: A Little Help needed climbing 3-5 steps with a railing? : A Little 6 Click Score: 21    End of Session Equipment Utilized During Treatment: Gait belt Activity Tolerance: Patient tolerated treatment well Patient left: in bed;with bed alarm set;with call bell/phone within reach   PT Visit Diagnosis: Other symptoms and signs involving the nervous system (R29.898)     Time: 4403-4742 PT Time Calculation (min) (ACUTE ONLY): 21 min  Charges:    $Gait Training: 8-22 mins PT General Charges $$ ACUTE PT VISIT: 1 Visit                     Virgil Benedict, PT, DPT Acute  Rehabilitation Services  Office: [phone]    Bettina Gavia 04/05/2023, 6:00 PM

## 2023-04-05 NOTE — Progress Notes (Addendum)
STROKE TEAM PROGRESS NOTE   BRIEF HPI [name] is a 72 y.o. male with history of atrial fibrillation, recent PE and multiple DVTs on BLE ultrasound started on Xarelto 2 days prior to presentation, chronic R CN 6 palsy, GERD presenting with task specific apraxia and right hemianopsia.  On hospital arrival, a CT head revealed acute IPH with surrounding vasogenic edema and patient was admitted for further workup.  SIGNIFICANT HOSPITAL EVENTS 9/23: - CT head without contrast: acute left parieto-occipital IPH s/p Andexxa administration  - CTA: no LVO, hemodynamically significant stenosis, aneurysm, or vascular malformation in the head or neck 9/24: - CT head stability scan: More homogenously dense and slightly fuller left cerebral SDH but similar degree of local mass effect. - MRI brain: No infarct or mass seen underlying patient's left parieto-occipital hematoma. There are chronic superficial micro-hemorrhages and mild superficial siderosis suggesting amyloid angiopathy. - IVC filter placed 9/25: - CT head stability scan: Unchanged left parieto-occipital hematoma with edema and local mass effect.  No new abnormality. - CT chest, abdomen, pelvis 9/25: No acute abnormality in the chest, abdomen, or pelvis.  No evidence for malignancy.  IVC filter is well-positioned in the infrarenal IVC.   INTERIM HISTORY/SUBJECTIVE No family is present at bedside today during neurology assessment.  Overnight CT head reveals stable IPH.  Neurologic exam and vital signs remained stable overnight. Right homonymous hemianopsia and mild dysarthria unchanged.  Patient is stable to transfer to floor out of ICU.  Patient does express concern regarding not making any progress since admission and endorses ongoing slight confusion.  Patient does share concern about his ability to be discharged home safely in his current condition.  He does report slight headache this morning.  OBJECTIVE CBC    Component Value  Date/Time   WBC 7.2 04/03/2023 1907   RBC 4.49 04/03/2023 1907   HGB 13.3 04/03/2023 1907   HGB 13.7 04/03/2023 1907   HCT 39.0 04/03/2023 1907   HCT 40.4 04/03/2023 1907   PLT 324 04/03/2023 1907   MCV 90.0 04/03/2023 1907   MCH 30.5 04/03/2023 1907   MCHC 33.9 04/03/2023 1907   RDW 12.2 04/03/2023 1907   LYMPHSABS 1.6 04/03/2023 1907   MONOABS 0.4 04/03/2023 1907   EOSABS 0.2 04/03/2023 1907   BASOSABS 0.0 04/03/2023 1907   BMET    Component Value Date/Time   NA 139 04/03/2023 1907   NA 138 04/03/2023 1907   K 3.8 04/03/2023 1907   K 4.0 04/03/2023 1907   CL 105 04/03/2023 1907   CL 106 04/03/2023 1907   CO2 23 04/03/2023 1907   GLUCOSE 102 (H) 04/03/2023 1907   GLUCOSE 109 (H) 04/03/2023 1907   BUN 12 04/03/2023 1907   BUN 11 04/03/2023 1907   CREATININE 0.80 04/03/2023 1907   CREATININE 0.89 04/03/2023 1907   CALCIUM 9.1 04/03/2023 1907   GFRNONAA >60 04/03/2023 1907   IMAGING past 24 hours CT HEAD WO CONTRAST ( )  Result Date: 04/05/2023 CLINICAL DATA:  Stroke follow-up EXAM: CT HEAD WITHOUT CONTRAST TECHNIQUE: Contiguous axial images were obtained from the base of the skull through the vertex without intravenous contrast. RADIATION DOSE REDUCTION: This exam was performed according to the departmental dose-optimization program which includes automated exposure control, adjustment of the mA and/or kV according to patient size and/or use of iterative reconstruction technique. COMPARISON:  Brain MRI from yesterday FINDINGS: Brain: Left parietooccipital hematoma measuring up to 5.2 cm craniocaudal, stable from yesterday. Regional edema and local mass  effect is non progressed. Chronic small vessel ischemia in the cerebral white matter. No hydrocephalus. Vascular: No hyperdense vessel or unexpected calcification. Skull: Normal. Negative for fracture or focal lesion. Sinuses/Orbits: No acute finding. IMPRESSION: Unchanged left parietooccipital hematoma with edema and local mass  effect. No new abnormality. Electronically Signed   By: Tiburcio Pea M.D.   On: 04/05/2023 06:13   MR BRAIN W WO CONTRAST  Result Date: 04/04/2023 CLINICAL DATA:  Headache and confusion. Hypertension. Trouble sing at of the right eye. 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 EXAM: MRI HEAD WITHOUT AND WITH CONTRAST TECHNIQUE: Multiplanar, multiecho pulse sequences of the brain and surrounding structures were obtained without and with intravenous contrast. CONTRAST:  7.43mL GADAVIST GADOBUTROL 1 MMOL/ML IV SOLN COMPARISON:  Head CT from earlier today FINDINGS: Brain: Known acute hematoma in the left parietal and occipital region which extends to the brain surface. There are numerous chronic microhemorrhages scattered along the cerebral convexities, although more asymmetric to the left cerebral hemisphere. 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 Mild for age chronic small vessel ischemic type change in the cerebral white matter. The rim of moderate vasogenic edema surrounds the hematoma. No underlying infarct is detected. No evidence of mass lesion. Vascular: Major flow voids and vascular enhancements are preserved Skull and upper cervical spine: Normal marrow signal Sinuses/Orbits: Negative IMPRESSION: No infarct or mass seen underlying the patient's left parieto-occipital hematoma. There are chronic superficial micro-hemorrhages and mild superficial siderosis suggesting amyloid angiopathy. Electronically Signed   By: Tiburcio Pea M.D.   On: 04/04/2023 10:39    Vitals:   04/05/23 0500 04/05/23 0600 04/05/23 0700 04/05/23 0800  BP: (!) 154/103 (!) 151/86 134/89 (!) 144/78  Pulse: 83 75 70 71  Resp: 11 18 17 20   Temp:    97.8 F (36.6 C)  TempSrc:    Oral  SpO2: 97% 97% 95% 96%  Weight:      Height:       PHYSICAL EXAM General:  Alert, well-nourished, well-developed Caucasian male in no acute distress Psych:  Mood and affect appropriate  for situation, calm and cooperative with exam CV: Regular rate and rhythm on monitor Respiratory:  Regular, unlabored respirations on room air GI: Abdomen soft and nontender  NEURO:  Mental Status: AA&Ox3, patient is able to give clear and coherent history Speech/Language: speech is with minimal dysarthria, no aphasia.  Naming, repetition, fluency, and comprehension intact.  Cranial Nerves:  II: PERRL. Right homonymous hemianopsia. III, IV, VI: Right CN6 palsy, no nystagmus V: Sensation is intact to light touch and symmetrical to face.  VII: Face is symmetrical resting and smiling VIII: Hearing intact to voice. IX, X: Palate elevates symmetrically. Phonation is normal.  GL:OVFIEPPI shrug 5/5. XII: Tongue protrudes midline Motor: 5/5 strength throughout without asymmetry or unilateral weakness Tone: is normal and bulk is normal Sensation: Intact and symmetric to light touch bilaterally.  Coordination: No ataxia noted Gait: Deferred  ASSESSMENT/PLAN  Intracerebral Hemorrhage:  left parieto-occipital convexity ICH with surrounding vasogenic edema without significant mass effect or midline shift following Xarelto initiation 2 days PTA. S/p Andexxa administration for reversal.  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  Surrounding vasogenic edema without significant regional mass effect or midline shift. Repeat CT head 04/04/2023: More homogenously dense and slightly fuller left cerebral  ICH but similar degree of local mass effect. CTA head & neck no LVO, hemodynamically significant stenosis, aneurysm, or vascular malformation in the head or neck MRI brain with and without contrast 04/04/2023: No infarct or mass seen underlying the patient's left parieto-occipital hematoma.  There are chronic  superficial microhemorrhages and mild superficial siderosis suggesting amyloid angiopathy versus chronic hypertensive microangiopathy. Vascular US lower extremity DVT 04/01/23: Findings consistent with acute DVT involving the right posterior tibial veins, and right peroneal veins.  Findings consistent with acute intramuscular thrombosis involving the left gastrocnemius veins.  2D Echo 04/01/23: LVEF 55-60%.  Right atrial size was mildly dilated. Repeat CT head 04/05/2023: Unchanged left parieto-occipital hematoma with edema and local mass effect.  No new abnormality. CT C/A/P 04/05/23 for malignancy is without acute abnormality, no evidence for malignancy.  IVC filter is well-positioned in the infrarenal IVC. Consider watchman device placement IVC filter placed 9/24 LDL 68 HgbA1c 5.7% VTE prophylaxis - SCDs due to IPH  Xarelto (rivaroxaban) daily prior to admission, now on No antithrombotic due to IPH Therapy recommendations: PT initially recommended ongoing acute skilled PT, outpatient neuro OT recommended Disposition:  Pending   Atrial fibrillation; PAF reported at PCP office in September 2024 without supporting documentation for review Pulmonary Embolism 03/31/23 DVT bilateral via ultrasound 04/01/23 Home Meds: Diltiazem, Patient previously on Xarelto, held in the setting of acute IPH Reversed with Andexxa on admission  Continue telemetry monitoring Hold in the setting of acute IPH occurring 2 days after Xarelto initiation for DVT/PE  Hypertension Home meds:  Diltiazem  Stable Blood Pressure Goal: SBP less than 160   Hyperlipidemia Home meds:  none LDL 68, goal < 70 High intensity statin not indicated as patient is at LDL goal without medication at this time  Other Stroke Risk Factors Family hx stroke  Other Active Problems Chronic right cranial nerve 6 palsy   Hospital day # 2  Lanae Boast, AGACNP-BC [facility] Pager: [phone]  I have personally  obtained history,examined this patient, reviewed notes, independently viewed imaging studies, participated in medical decision making and plan of care.ROS completed by me personally and pertinent positives fully documented  I have made any additions or clarifications directly to the above note. Agree with note above.  Patient neurological exam remained stable and CT head this morning shows stable appearance of the left parieto-occipital hemorrhage with mild cytotoxic edema with no significant intraventricular extension or midline shift.  Mobilize out of bed.  Therapy consults.  Transfer to neurology floor bed.  Strict control of blood pressure systolic goal below 160.  Long discussion with patient's wife, met in the afternoon when she arrived and answered questions.  Tricky situation of holding anticoagulation in setting of acute hemorrhage versus risk of pulmonary embolism getting worse.  He had IVC filter yesterday.  Will need to reevaluate risk-benefit of anticoagulation and 1 to 2 weeks or sooner if he develops any respiratory difficulty.  Greater than 50% time during this 50-minute visit was spent in counseling and coordination of care about his intracerebral hemorrhage and pulm embolism and answering questions.  Hopefully discharge home in the next 1 to 2 days if stable. Delia Heady, MD Medical Director [facility] Pager: [phone] 04/05/2023 3:46 PM  To contact Stroke Continuity provider, please refer to [email or web address]. After hours, contact General Neurology

## 2023-04-05 NOTE — Progress Notes (Signed)
Occupational Therapy Treatment Patient Details Name: Mark Cohen MRN: [id number] DOB: [birth year] Today's Date: 04/05/2023   History of present illness Pt is 72 yo male who presents on 04/03/23 with slurred speech and found to have L parieto-occipital ICH. Noted dc on 9/21 with PE, on Xarelto. PMH: PE (new), Afib (new),GERD, HTN, HLD, anxiety, CN4 palsy   OT comments  Pt seen to further assess and work on vision. Education on safety due to visual field cut was taught to pt and his wife. He is aware he needs turn his head more to the right to try and see things due to this but still needs cues or increased time to do so. He will continue to benefit from acute OT with follow up OPOT for vision.      If plan is discharge home, recommend the following:  A little help with walking and/or transfers;A little help with bathing/dressing/bathroom;Assistance with cooking/housework;Assist for transportation;Help with stairs or ramp for entrance;Direct supervision/assist for financial management;Direct supervision/assist for medications management   Equipment Recommendations  None recommended by OT       Precautions / Restrictions Precautions Precautions: Fall Precaution Comments: BP <150, R visual deficits(field cut) Restrictions Weight Bearing Restrictions: No       Mobility Bed Mobility  Independent                  Transfers    S due to vision                     Balance    Mild deficits due to vision                                       ADL either performed or assessed with clinical judgement   ADL                                         General ADL Comments: Educated pt and wife that pt should not be driving (they both verbalized understanding); that he needed to be really careful and supervised in kitchen if he was trying to cook on stove top, making coffee, or cutting with sharp knife; that he needed to be careful when he  is in new/unfamilar environments of even ones that he is familair with as far as tripping over or running into objects that are out of place. Recommended to them that patient get a full visual field test done at eye docs office--so they would know just how much vision he is missing.    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 Even with using his own finger to point to numbers and letters on his  dry erase board sometimes he named them wrong.          Cognition Arousal: Alert Behavior During Therapy: WFL for tasks assessed/performed Overall Cognitive Status: Impaired/Different from baseline                                 General Comments: when reading letters and numbers he would intermittently name them the wrong thing or "see" them wrong and thus name them wrong                   Pertinent Vitals/ Pain       Pain Assessment Pain Assessment: No/denies pain         Frequency  Min 1X/week        Progress Toward Goals  OT Goals(current goals can now be found in the care plan section)  Progress towards OT goals: Progressing toward goals  Acute Rehab OT Goals Patient Stated Goal: to go home OT Goal Formulation: With patient Time For Goal Achievement: 04/18/23 Potential to Achieve Goals: Good  Plan         AM-PAC OT "6 Clicks" Daily Activity     Outcome Measure   Help from  another person eating meals?: A Little Help from another person taking care of personal grooming?: A Little Help from another person toileting, which includes using toliet, bedpan, or urinal?: A Little Help from another person bathing (including washing, rinsing, drying)?: A Little Help from another person to put on and taking off regular upper body clothing?: A Little Help from another person to put on and taking off regular lower body clothing?: A Little 6 Click Score: 18    End of Session    OT Visit Diagnosis: Low vision, both eyes (H54.2)   Activity Tolerance Patient tolerated treatment well   Patient Left in bed;with bed alarm set;with family/visitor present           Time: 1610-9604 OT Time Calculation (min): 26 min  Charges: OT General Charges $OT Visit: 1 Visit OT Treatments $Self Care/Home Management : 23-37 mins  Mark Cohen OT Acute Rehabilitation Services Office [phone]    Evette Georges 04/05/2023, 9:54 PM

## 2023-04-06 ENCOUNTER — Telehealth: Payer: Self-pay | Admitting: Internal Medicine

## 2023-04-06 DIAGNOSIS — I611 Nontraumatic intracerebral hemorrhage in hemisphere, cortical: Secondary | ICD-10-CM | POA: Diagnosis not present

## 2023-04-06 LAB — CBC
HCT: 40.3 % (ref 39.0–52.0)
Hemoglobin: 14.1 g/dL (ref 13.0–17.0)
MCH: 31.6 pg (ref 26.0–34.0)
MCHC: 35 g/dL (ref 30.0–36.0)
MCV: 90.4 fL (ref 80.0–100.0)
Platelets: 272 10*3/uL (ref 150–400)
RBC: 4.46 MIL/uL (ref 4.22–5.81)
RDW: 12.2 % (ref 11.5–15.5)
WBC: 7.4 10*3/uL (ref 4.0–10.5)
nRBC: 0 % (ref 0.0–0.2)

## 2023-04-06 LAB — BASIC METABOLIC PANEL
Anion gap: 10 (ref 5–15)
BUN: 10 mg/dL (ref 8–23)
CO2: 25 mmol/L (ref 22–32)
Calcium: 9.1 mg/dL (ref 8.9–10.3)
Chloride: 100 mmol/L (ref 98–111)
Creatinine, Ser: 0.87 mg/dL (ref 0.61–1.24)
GFR, Estimated: >60 mL/min (ref >60)
Glucose, Bld: 88 mg/dL (ref 70–99)
Potassium: 4.1 mmol/L (ref 3.5–5.1)
Sodium: 135 mmol/L (ref 135–145)

## 2023-04-06 NOTE — Telephone Encounter (Signed)
Patient was scheduled for a hospital follow up for tomorrow 04/07/23, but he was hospitalized again. He will be leaving the hospital later today, but was advised to wait until next week to follow up with PCP. Is there a date/time that patient could be fit in next week for a new hospital follow up appointment? Best callback for wife Wynona Canes is [phone].

## 2023-04-06 NOTE — TOC Transition Note (Signed)
Transition of Care [facility]) - CM/SW Discharge Note   Patient Details  Name: Mark Cohen MRN: [id number] Date of Birth: Oct 09, 1950  [facility] CM/SW Contact:  Kermit Balo, RN Phone Number: 04/06/2023, 10:33 AM   Clinical Narrative:    Patient is from home with his spouse. They are together most of the time but wife plans to be with him all the time after d/c.  Pt has a cane but doesn't use it at home.  Pt was driving but his wife will provide needed transportation. Patient manages his own medications at home but after d/c his spouse plans to over see them.  Outpatient therapy arranged through [facility]. Referral sent and information on the AVS. Pt will call to schedule the first appointment. He is aware.  Wife will transport home.   Final next level of care: OP Rehab Barriers to Discharge: No Barriers Identified   Patient Goals and CMS Choice   Choice offered to / list presented to : Patient  Discharge Placement                         Discharge Plan and Services Additional resources added to the After Visit Summary for                                       Social Determinants of Health (SDOH) Interventions SDOH Screenings   Food Insecurity: No Food Insecurity (04/05/2023)  Housing: Low Risk  (04/05/2023)  Transportation Needs: No Transportation Needs (04/05/2023)  Utilities: Not At Risk (04/05/2023)  Alcohol Screen: Low Risk  (03/28/2023)  Depression (PHQ2-9): Low Risk  (02/01/2023)  Financial Resource Strain: Low Risk  (03/28/2023)  Physical Activity: Sufficiently Active (03/28/2023)  Social Connections: Moderately Isolated (03/28/2023)  Stress: No Stress Concern Present (03/28/2023)  Tobacco Use: Medium Risk (04/03/2023)  Health Literacy: Adequate Health Literacy (02/01/2023)     Readmission Risk Interventions     No data to display

## 2023-04-06 NOTE — Progress Notes (Signed)
Nursing Discharge Note   Name: Mark Cohen MRN: [id number] DOB: March 17, 1951    Admit Date: 04/03/2023  Discharge Date: 04/06/2023  Mark Cohen is to be discharged home per MD order.  AVS completed. Reviewed with patient and family at bedside. Highlighted copy provided for patient to take home.  Patient/caregiver able to verbalize understanding of discharge instructions. PIV removed. Patient stable upon discharge.   Discharge Instructions     Ambulatory referral to Occupational Therapy   Complete by: As directed    Ambulatory referral to Physical Therapy   Complete by: As directed    Ambulatory referral to Speech Therapy   Complete by: As directed    Diet - low sodium heart healthy   Complete by: As directed    Increase activity slowly   Complete by: As directed    No dressing needed   Complete by: As directed    No wound care   Complete by: As directed        Allergies as of 04/06/2023   No Known Allergies      Medication List     STOP taking these medications    Rivaroxaban 15 MG Tabs tablet Commonly known as: XARELTO       TAKE these medications    ALPRAZolam 0.25 MG tablet Commonly known as: XANAX Take 1 tablet (0.25 mg total) by mouth 2 (two) times daily as needed. What changed: reasons to take this   DIALYVITE VITAMIN D 5000 PO Take 1 tablet by mouth daily.   diltiazem 120 MG 24 hr capsule Commonly known as: Cardizem CD Take 1 capsule (120 mg total) by mouth daily.   esomeprazole 40 MG capsule Commonly known as: NEXIUM TAKE 1 CAPSULE BY MOUTH DAILY   HYDROcodone-acetaminophen 10-325 MG tablet Commonly known as: NORCO Take 1 tablet by mouth every 8 (eight) hours. What changed:  when to take this reasons to take this               Discharge Care Instructions  (From admission, onward)           Start     Ordered   04/06/23 0000  No dressing needed        04/06/23 1148            Discharge Instructions/  Education: Discharge instructions given to patient/family with verbalized understanding. Discharge education completed with patient/family including: follow up instructions, medication list, discharge activities, and limitations if indicated. Additional discharge instructions as indicated by discharging provider also reviewed. Patient and family able to verbalize understanding, all questions fully answered. Patient instructed to return to Emergency Department, call 911, or call MD for any changes in condition.  Patient escorted via wheelchair to lobby and discharged home via private automobile.

## 2023-04-06 NOTE — Progress Notes (Signed)
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 Noted dc on 9/21 with PE, on Xarelto. 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 PMH: PE (new), Afib (new),GERD, HTN, HLD, anxiety, CN4 palsy    PT Comments  Tolerated treatment well. Reinforced techniques to develop strategies for scanning Rt visual field for obstacles/hazards while ambulating. Progressed from CGA early in session to a supervision level towards end of session with intermittent cues for awareness of surroundings on Rt. Completed stair training. Not a fan of the quad cane but may be agreeable to [facility] use, which he has at home. No overt buckling, able to self correct minor instabilities when challenged with dynamic gait tasks. Eager to return home. Has wife and a cousin that can assist if needed. Patient will continue to benefit from skilled physical therapy services to further improve independence with functional mobility.     If plan is discharge home, recommend the following: Assist for transportation;Supervision due to cognitive status;Assistance with cooking/housework;Help with stairs or ramp for entrance   Can travel by private vehicle        Equipment Recommendations  None recommended by PT    Recommendations for Other Services       Precautions / Restrictions Precautions Precautions: Fall Precaution Comments: BP <150, R visual deficits(field cut) Restrictions Weight Bearing Restrictions: No     Mobility  Bed Mobility Overal bed mobility: Needs Assistance Bed Mobility: Supine to Sit, Sit to Supine     Supine to sit: Supervision, HOB elevated Sit to supine: Supervision, HOB elevated   General bed mobility comments: Supervision for safety, no assist needed.    Transfers Overall transfer  level: Needs assistance Equipment used: None Transfers: Sit to/from Stand Sit to Stand: Supervision           General transfer comment: Supervision for safety, mild sway, able to self correct. Declines AD.    Ambulation/Gait Ambulation/Gait assistance: Contact guard assist, Supervision Gait Distance (Feet): 150 Feet Assistive device: None, Quad cane Gait Pattern/deviations: Step-through pattern, Decreased step length - right, Drifts right/left Gait velocity: decreased Gait velocity interpretation: <1.8 ft/sec, indicate of risk for recurrent falls   General Gait Details: Intermittently drifting towards Rt. CGA initially. Cues for awareness and scanning techniques, with good carry over and progressed to supervision level with gait. No overt buckling or stagger. Challenged with dynamic tasks which did increase instability modestly but pt able to self correct perturbations. Cues for awareness. Did not like using quad cane however seemed to improve symmetry.   Stairs Stairs: Yes Stairs assistance: Contact guard assist Stair Management: Forwards, No rails, Alternating pattern, Step to pattern, With cane Number of Stairs: 5 (x4) General stair comments: Practiced navigating stairs similar to home set-up without rails. Initially alternating pattern with notable imbalance. Then with quad cane, which greatly improved balance however pt not agreeable to use due to feeling as if he doesn't need it. Finally navigated with step-to pattern, more stable, CGA. Educated on safety, awareness, and recommendation for AD use or have wife assist pt into house for safety at d/c. Might be agreeable to [facility] which he has at home but could not locate one to try during this session.   Wheelchair Mobility     Tilt Bed    Modified Rankin (Stroke Patients Only) Modified  Rankin (Stroke Patients Only) Pre-Morbid Rankin Score: No symptoms Modified Rankin: Slight disability     Balance Overall balance  assessment: Needs assistance Sitting-balance support: No upper extremity supported, Feet supported Sitting balance-Leahy Scale: Good     Standing balance support: No upper extremity supported, During functional activity Standing balance-Leahy Scale: Good                   Standardized Balance Assessment Standardized Balance Assessment : Dynamic Gait Index   Dynamic Gait Index Level Surface: Mild Impairment Change in Gait Speed: Normal Gait with Horizontal Head Turns: Normal Gait with Vertical Head Turns: Normal Gait and Pivot Turn: Normal Step Over Obstacle: Moderate Impairment Step Around Obstacles: Moderate Impairment Steps: Mild Impairment Total Score: 18      Cognition Arousal: Alert Behavior During Therapy: WFL for tasks assessed/performed Overall Cognitive Status: Impaired/Different from baseline Area of Impairment: Problem solving, Safety/judgement                         Safety/Judgement: Decreased awareness of safety, Decreased awareness of deficits   Problem Solving: Slow processing, Requires verbal cues          Exercises Other Exercises Other Exercises: Education for visual scanning towards right, object finding and recognition.    General Comments        Pertinent Vitals/Pain Pain Assessment Pain Assessment: No/denies pain Pain Intervention(s): Monitored during session    Home Living                          Prior Function            PT Goals (current goals can now be found in the care plan section) Acute Rehab PT Goals Patient Stated Goal: return home PT Goal Formulation: With patient Time For Goal Achievement: 04/18/23 Potential to Achieve Goals: Good Progress towards PT goals: Progressing toward goals    Frequency    Min 1X/week      PT Plan      Co-evaluation              AM-PAC PT "6 Clicks" Mobility   Outcome Measure  Help needed turning from your back to your side while in a flat bed  without using bedrails?: None Help needed moving from lying on your back to sitting on the side of a flat bed without using bedrails?: None Help needed moving to and from a bed to a chair (including a wheelchair)?: None Help needed standing up from a chair using your arms (e.g., wheelchair or bedside chair)?: A Little Help needed to walk in hospital room?: A Little Help needed climbing 3-5 steps with a railing? : A Little 6 Click Score: 21    End of Session Equipment Utilized During Treatment: Gait belt Activity Tolerance: Patient tolerated treatment well Patient left: in bed;with bed alarm set;with call bell/phone within reach   PT Visit Diagnosis: Other symptoms and signs involving the nervous system (R29.898)     Time: 4098-1191 PT Time Calculation (min) (ACUTE ONLY): 32 min  Charges:    $Gait Training: 23-37 mins PT General Charges $$ ACUTE PT VISIT: 1 Visit                     Kathlyn [place], PT, DPT [facility]  Rehabilitation Services Physical Therapist Office: [phone] Website: [email or web address]    Mark Cohen 04/06/2023, 10:03 AM

## 2023-04-06 NOTE — Discharge Instructions (Signed)
Mark Cohen, you were admitted with a hemorrhagic stroke on the left side of your brain.  Your Xarelto was reversed with Andexxa, and your blood pressure was controlled.  You are now ready to go home with outpatient PT and OT.  You will need to see your PCP within a week and should discuss getting a follow up CT scan of your head within two weeks.  You will also need to follow up in the stroke clinic 8 weeks after discharge.

## 2023-04-06 NOTE — Plan of Care (Signed)
Problem: Education: Goal: Knowledge of disease or condition will improve Outcome: Progressing Goal: Knowledge of secondary prevention will improve (MUST DOCUMENT ALL) Outcome: Progressing Goal: Knowledge of patient specific risk factors will improve Loraine Leriche N/A or DELETE if not current risk factor) Outcome: Progressing   Problem: Intracerebral Hemorrhage Tissue Perfusion: Goal: Complications of Intracerebral Hemorrhage will be minimized Outcome: Progressing   Problem: Coping: Goal: Will verbalize positive feelings about self Outcome: Progressing Goal: Will identify appropriate support needs Outcome: Progressing   Problem: Health Behavior/Discharge Planning: Goal: Ability to manage health-related needs will improve Outcome: Progressing Goal: Goals will be collaboratively established with patient/family Outcome: Progressing   Problem: Self-Care: Goal: Ability to participate in self-care as condition permits will improve Outcome: Progressing Goal: Verbalization of feelings and concerns over difficulty with self-care will improve Outcome: Progressing Goal: Ability to communicate needs accurately will improve Outcome: Progressing   Problem: Nutrition: Goal: Risk of aspiration will decrease Outcome: Progressing Goal: Dietary intake will improve Outcome: Progressing   Problem: Education: Goal: Knowledge of General Education information will improve Description: Including pain rating scale, medication(s)/side effects and non-pharmacologic comfort measures Outcome: Progressing   Problem: Health Behavior/Discharge Planning: Goal: Ability to manage health-related needs will improve Outcome: Progressing   Problem: Clinical Measurements: Goal: Ability to maintain clinical measurements within normal limits will improve Outcome: Progressing Goal: Will remain free from infection Outcome: Progressing Goal: Diagnostic test results will improve Outcome: Progressing Goal:  Respiratory complications will improve Outcome: Progressing Goal: Cardiovascular complication will be avoided Outcome: Progressing   Problem: Activity: Goal: Risk for activity intolerance will decrease Outcome: Progressing   Problem: Nutrition: Goal: Adequate nutrition will be maintained Outcome: Progressing   Problem: Coping: Goal: Level of anxiety will decrease Outcome: Progressing   Problem: Elimination: Goal: Will not experience complications related to bowel motility Outcome: Progressing Goal: Will not experience complications related to urinary retention Outcome: Progressing   Problem: Pain Managment: Goal: General experience of comfort will improve Outcome: Progressing   Problem: Safety: Goal: Ability to remain free from injury will improve Outcome: Progressing   Problem: Skin Integrity: Goal: Risk for impaired skin integrity will decrease Outcome: Progressing

## 2023-04-06 NOTE — Discharge Summary (Addendum)
greater), or occlusion. Vertebral arteries: Codominant. No evidence of dissection, stenosis (50% or greater), or occlusion. Skeleton: Mild cervical spondylosis without high-grade spinal canal stenosis. Other neck: Unremarkable. Upper chest: Unremarkable. Review of the MIP images confirms the above findings CTA HEAD FINDINGS Anterior circulation: Intracranial ICAs are patent without stenosis or aneurysm. The proximal ACAs and MCAs are patent without stenosis, aneurysm or vascular malformation. Distal branches are symmetric. Posterior circulation: Normal basilar artery. The SCAs, AICAs and PICAs are patent proximally. The PCAs are patent proximally without stenosis, aneurysm or vascular malformation. Distal branches are symmetric. Venous sinuses: As permitted by contrast timing, patent. Anatomic variants: Hypoplastic right A1 segment. Review of the MIP images confirms the above findings IMPRESSION: No large vessel occlusion, hemodynamically significant stenosis, aneurysm, or vascular malformation in the head or neck. Electronically Signed   By: Orvan Falconer M.D.   On: 04/03/2023 19:41   CT HEAD CODE STROKE WO CONTRAST  Result Date: 04/03/2023 CLINICAL DATA:  Code stroke. Initial evaluation for neuro deficit, stroke suspected. EXAM: CT HEAD WITHOUT CONTRAST TECHNIQUE: Contiguous axial images were obtained from the base of the skull through the vertex without intravenous contrast. RADIATION DOSE REDUCTION: This exam was performed according to the departmental dose-optimization program which includes automated exposure control, adjustment of the mA and/or kV according to patient size and/or use of iterative reconstruction technique. COMPARISON:  Prior study from 06/03/2005. FINDINGS: Brain: Acute intraparenchymal hematoma centered at the left parieto-occipital convexity measures 5.3 x 4.0 x 3.0 cm (estimated volume 32 mL). Surrounding vasogenic edema without significant regional mass effect or midline shift. No  intraventricular or extra-axial extension. No other acute intracranial hemorrhage. No other large vessel territory infarct. Underlying mild chronic microvascular ischemic disease noted. No midline shift or hydrocephalus. Vascular: No abnormal hyperdense vessel. Skull: Scalp soft tissues and calvarium demonstrate no acute finding. Sinuses/Orbits: Globes and orbital soft tissues within normal limits. Scattered mucosal thickening present about the ethmoidal air cells. No mastoid effusion. Other: None. ASPECTS [facility] Stroke Program Early CT Score) Does not apply, acute ICH. IMPRESSION: 5.3 x 4.0 x 3.0 cm acute intraparenchymal hematoma centered at the left parieto-occipital convexity (estimated volume 32 mL). Surrounding vasogenic edema without significant regional mass effect or midline shift. These results were communicated to Dr. Derry Lory at 7:19 pm on 04/03/2023 by text page via the [facility] messaging system. Electronically Signed   By: Rise Mu M.D.   On: 04/03/2023 19:20   VAS [place] LOWER EXTREMITY VENOUS (DVT)  Result Date: 04/01/2023  Lower Venous DVT Study Patient Name:  Mark Cohen  Date of Exam:   04/01/2023 Medical Rec #: [id number]          Accession #:    [id number] Date of Birth: 1951-04-16         Patient Gender: M Patient Age:   72 years Exam Location:  [facility] Procedure:      VAS [place] LOWER EXTREMITY VENOUS (DVT) Referring Phys: CHING TU --------------------------------------------------------------------------------  Indications: Pulmonary embolism.  Comparison Study: No prior study on file Performing Technologist: Sherren Kerns RVS  Examination Guidelines: A complete evaluation includes B-mode imaging, spectral Doppler, color Doppler, and power Doppler as needed of all accessible portions of each vessel. Bilateral testing is considered an integral part of a complete examination. Limited examinations for reoccurring indications may be performed as noted. The reflux portion  of the exam is performed with the patient in reverse Trendelenburg.  +---------+---------------+---------+-----------+----------+--------------+ RIGHT    CompressibilityPhasicitySpontaneityPropertiesThrombus Aging +---------+---------------+---------+-----------+----------+--------------+ CFV  greater), or occlusion. Vertebral arteries: Codominant. No evidence of dissection, stenosis (50% or greater), or occlusion. Skeleton: Mild cervical spondylosis without high-grade spinal canal stenosis. Other neck: Unremarkable. Upper chest: Unremarkable. Review of the MIP images confirms the above findings CTA HEAD FINDINGS Anterior circulation: Intracranial ICAs are patent without stenosis or aneurysm. The proximal ACAs and MCAs are patent without stenosis, aneurysm or vascular malformation. Distal branches are symmetric. Posterior circulation: Normal basilar artery. The SCAs, AICAs and PICAs are patent proximally. The PCAs are patent proximally without stenosis, aneurysm or vascular malformation. Distal branches are symmetric. Venous sinuses: As permitted by contrast timing, patent. Anatomic variants: Hypoplastic right A1 segment. Review of the MIP images confirms the above findings IMPRESSION: No large vessel occlusion, hemodynamically significant stenosis, aneurysm, or vascular malformation in the head or neck. Electronically Signed   By: Orvan Falconer M.D.   On: 04/03/2023 19:41   CT HEAD CODE STROKE WO CONTRAST  Result Date: 04/03/2023 CLINICAL DATA:  Code stroke. Initial evaluation for neuro deficit, stroke suspected. EXAM: CT HEAD WITHOUT CONTRAST TECHNIQUE: Contiguous axial images were obtained from the base of the skull through the vertex without intravenous contrast. RADIATION DOSE REDUCTION: This exam was performed according to the departmental dose-optimization program which includes automated exposure control, adjustment of the mA and/or kV according to patient size and/or use of iterative reconstruction technique. COMPARISON:  Prior study from 06/03/2005. FINDINGS: Brain: Acute intraparenchymal hematoma centered at the left parieto-occipital convexity measures 5.3 x 4.0 x 3.0 cm (estimated volume 32 mL). Surrounding vasogenic edema without significant regional mass effect or midline shift. No  intraventricular or extra-axial extension. No other acute intracranial hemorrhage. No other large vessel territory infarct. Underlying mild chronic microvascular ischemic disease noted. No midline shift or hydrocephalus. Vascular: No abnormal hyperdense vessel. Skull: Scalp soft tissues and calvarium demonstrate no acute finding. Sinuses/Orbits: Globes and orbital soft tissues within normal limits. Scattered mucosal thickening present about the ethmoidal air cells. No mastoid effusion. Other: None. ASPECTS [facility] Stroke Program Early CT Score) Does not apply, acute ICH. IMPRESSION: 5.3 x 4.0 x 3.0 cm acute intraparenchymal hematoma centered at the left parieto-occipital convexity (estimated volume 32 mL). Surrounding vasogenic edema without significant regional mass effect or midline shift. These results were communicated to Dr. Derry Lory at 7:19 pm on 04/03/2023 by text page via the [facility] messaging system. Electronically Signed   By: Rise Mu M.D.   On: 04/03/2023 19:20   VAS [place] LOWER EXTREMITY VENOUS (DVT)  Result Date: 04/01/2023  Lower Venous DVT Study Patient Name:  Mark Cohen  Date of Exam:   04/01/2023 Medical Rec #: [id number]          Accession #:    [id number] Date of Birth: 1951-04-16         Patient Gender: M Patient Age:   72 years Exam Location:  [facility] Procedure:      VAS [place] LOWER EXTREMITY VENOUS (DVT) Referring Phys: CHING TU --------------------------------------------------------------------------------  Indications: Pulmonary embolism.  Comparison Study: No prior study on file Performing Technologist: Sherren Kerns RVS  Examination Guidelines: A complete evaluation includes B-mode imaging, spectral Doppler, color Doppler, and power Doppler as needed of all accessible portions of each vessel. Bilateral testing is considered an integral part of a complete examination. Limited examinations for reoccurring indications may be performed as noted. The reflux portion  of the exam is performed with the patient in reverse Trendelenburg.  +---------+---------------+---------+-----------+----------+--------------+ RIGHT    CompressibilityPhasicitySpontaneityPropertiesThrombus Aging +---------+---------------+---------+-----------+----------+--------------+ CFV  Stroke Discharge Summary  Patient ID: Mark Cohen   MRN: [id number]      DOB: September 24, 1950  Date of Admission: 04/03/2023 Date of Discharge: 04/06/2023  Attending Physician:  Stroke, Md, MD Consultant(s):     Interventional radiology   Patient's PCP:  Etta Grandchild, MD  DISCHARGE PRIMARY DIAGNOSIS:  Intracerebral Hemorrhage:  left parieto-occipital convexity ICH with surrounding vasogenic edema without significant mass effect or midline shift following Xarelto initiation 2 days PTA. S/p Andexxa administration for reversal.  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  versus hypertension in addition to anticoagulation with Xarelto initiation  Patient Active Problem List   Diagnosis Date Noted   ICH (intracerebral hemorrhage) (HCC) 04/03/2023   D-dimer, elevated 03/31/2023   Acute pulmonary embolism (HCC) 03/31/2023   Paroxysmal atrial fibrillation (HCC) 03/31/2023   Elevated troponin 03/31/2023   Dry mouth 03/29/2023   Atrial fibrillation with RVR (HCC) 03/29/2023   Elevated brain natriuretic peptide (BNP) level 03/29/2023   Sacro-iliac pain 02/03/2023   Chronic bilateral low back pain without sciatica 04/07/2022   Chronic pain of both hips 04/07/2022   Gastroesophageal reflux disease without esophagitis 10/12/2021   Polyp of colon 10/12/2021   Plantar fasciitis 04/18/2019   Benign prostatic hyperplasia without lower urinary tract symptoms 04/24/2018   Carpal tunnel syndrome on both sides 06/22/2017   Hypogonadism male 02/09/2017   Hyperlipidemia with target LDL less than 130 01/14/2016   GAD (generalized anxiety disorder) 01/21/2015   DJD (degenerative joint disease), multiple sites 05/10/2013   Left lumbar radiculitis 08/12/2011   Essential hypertension, benign 08/12/2011   Routine general medical examination at a health care facility 04/28/2011   ALLERGIC RHINITIS 01/29/2010     Secondary Diagnoses: Hypertension Atrial fibrillation 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 What changed: reasons to take this   DIALYVITE VITAMIN D 5000 PO Take 1 tablet by mouth daily.   diltiazem 120 MG 24 hr capsule Commonly known as: Cardizem CD Take 1 capsule (120 mg total) by mouth daily.   esomeprazole 40 MG capsule Commonly known as: NEXIUM TAKE 1 CAPSULE BY MOUTH DAILY   HYDROcodone-acetaminophen 10-325 MG tablet Commonly known as: NORCO Take 1 tablet by mouth every 8 (eight) hours. What changed:  when to take this reasons to take this        LABORATORY STUDIES CBC    Component Value Date/Time   WBC 7.2 04/03/2023 1907   RBC 4.49 04/03/2023 1907   HGB 13.3 04/03/2023 1907   HGB 13.7 04/03/2023 1907   HCT 39.0 04/03/2023 1907   HCT 40.4 04/03/2023 1907   PLT 324 04/03/2023 1907   MCV 90.0 04/03/2023 1907   MCH 30.5 04/03/2023 1907   MCHC 33.9 04/03/2023 1907   RDW 12.2 04/03/2023 1907   LYMPHSABS 1.6 04/03/2023 1907   MONOABS 0.4 04/03/2023 1907   EOSABS 0.2 04/03/2023 1907   BASOSABS 0.0 04/03/2023 1907   CMP    Component Value Date/Time   NA 139 04/03/2023 1907   NA 138 04/03/2023 1907   K  Stroke Discharge Summary  Patient ID: Mark Cohen   MRN: [id number]      DOB: September 24, 1950  Date of Admission: 04/03/2023 Date of Discharge: 04/06/2023  Attending Physician:  Stroke, Md, MD Consultant(s):     Interventional radiology   Patient's PCP:  Etta Grandchild, MD  DISCHARGE PRIMARY DIAGNOSIS:  Intracerebral Hemorrhage:  left parieto-occipital convexity ICH with surrounding vasogenic edema without significant mass effect or midline shift following Xarelto initiation 2 days PTA. S/p Andexxa administration for reversal.  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  versus hypertension in addition to anticoagulation with Xarelto initiation  Patient Active Problem List   Diagnosis Date Noted   ICH (intracerebral hemorrhage) (HCC) 04/03/2023   D-dimer, elevated 03/31/2023   Acute pulmonary embolism (HCC) 03/31/2023   Paroxysmal atrial fibrillation (HCC) 03/31/2023   Elevated troponin 03/31/2023   Dry mouth 03/29/2023   Atrial fibrillation with RVR (HCC) 03/29/2023   Elevated brain natriuretic peptide (BNP) level 03/29/2023   Sacro-iliac pain 02/03/2023   Chronic bilateral low back pain without sciatica 04/07/2022   Chronic pain of both hips 04/07/2022   Gastroesophageal reflux disease without esophagitis 10/12/2021   Polyp of colon 10/12/2021   Plantar fasciitis 04/18/2019   Benign prostatic hyperplasia without lower urinary tract symptoms 04/24/2018   Carpal tunnel syndrome on both sides 06/22/2017   Hypogonadism male 02/09/2017   Hyperlipidemia with target LDL less than 130 01/14/2016   GAD (generalized anxiety disorder) 01/21/2015   DJD (degenerative joint disease), multiple sites 05/10/2013   Left lumbar radiculitis 08/12/2011   Essential hypertension, benign 08/12/2011   Routine general medical examination at a health care facility 04/28/2011   ALLERGIC RHINITIS 01/29/2010     Secondary Diagnoses: Hypertension Atrial fibrillation 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 What changed: reasons to take this   DIALYVITE VITAMIN D 5000 PO Take 1 tablet by mouth daily.   diltiazem 120 MG 24 hr capsule Commonly known as: Cardizem CD Take 1 capsule (120 mg total) by mouth daily.   esomeprazole 40 MG capsule Commonly known as: NEXIUM TAKE 1 CAPSULE BY MOUTH DAILY   HYDROcodone-acetaminophen 10-325 MG tablet Commonly known as: NORCO Take 1 tablet by mouth every 8 (eight) hours. What changed:  when to take this reasons to take this        LABORATORY STUDIES CBC    Component Value Date/Time   WBC 7.2 04/03/2023 1907   RBC 4.49 04/03/2023 1907   HGB 13.3 04/03/2023 1907   HGB 13.7 04/03/2023 1907   HCT 39.0 04/03/2023 1907   HCT 40.4 04/03/2023 1907   PLT 324 04/03/2023 1907   MCV 90.0 04/03/2023 1907   MCH 30.5 04/03/2023 1907   MCHC 33.9 04/03/2023 1907   RDW 12.2 04/03/2023 1907   LYMPHSABS 1.6 04/03/2023 1907   MONOABS 0.4 04/03/2023 1907   EOSABS 0.2 04/03/2023 1907   BASOSABS 0.0 04/03/2023 1907   CMP    Component Value Date/Time   NA 139 04/03/2023 1907   NA 138 04/03/2023 1907   K  Stroke Discharge Summary  Patient ID: Mark Cohen   MRN: [id number]      DOB: September 24, 1950  Date of Admission: 04/03/2023 Date of Discharge: 04/06/2023  Attending Physician:  Stroke, Md, MD Consultant(s):     Interventional radiology   Patient's PCP:  Etta Grandchild, MD  DISCHARGE PRIMARY DIAGNOSIS:  Intracerebral Hemorrhage:  left parieto-occipital convexity ICH with surrounding vasogenic edema without significant mass effect or midline shift following Xarelto initiation 2 days PTA. S/p Andexxa administration for reversal.  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  versus hypertension in addition to anticoagulation with Xarelto initiation  Patient Active Problem List   Diagnosis Date Noted   ICH (intracerebral hemorrhage) (HCC) 04/03/2023   D-dimer, elevated 03/31/2023   Acute pulmonary embolism (HCC) 03/31/2023   Paroxysmal atrial fibrillation (HCC) 03/31/2023   Elevated troponin 03/31/2023   Dry mouth 03/29/2023   Atrial fibrillation with RVR (HCC) 03/29/2023   Elevated brain natriuretic peptide (BNP) level 03/29/2023   Sacro-iliac pain 02/03/2023   Chronic bilateral low back pain without sciatica 04/07/2022   Chronic pain of both hips 04/07/2022   Gastroesophageal reflux disease without esophagitis 10/12/2021   Polyp of colon 10/12/2021   Plantar fasciitis 04/18/2019   Benign prostatic hyperplasia without lower urinary tract symptoms 04/24/2018   Carpal tunnel syndrome on both sides 06/22/2017   Hypogonadism male 02/09/2017   Hyperlipidemia with target LDL less than 130 01/14/2016   GAD (generalized anxiety disorder) 01/21/2015   DJD (degenerative joint disease), multiple sites 05/10/2013   Left lumbar radiculitis 08/12/2011   Essential hypertension, benign 08/12/2011   Routine general medical examination at a health care facility 04/28/2011   ALLERGIC RHINITIS 01/29/2010     Secondary Diagnoses: Hypertension Atrial fibrillation 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 What changed: reasons to take this   DIALYVITE VITAMIN D 5000 PO Take 1 tablet by mouth daily.   diltiazem 120 MG 24 hr capsule Commonly known as: Cardizem CD Take 1 capsule (120 mg total) by mouth daily.   esomeprazole 40 MG capsule Commonly known as: NEXIUM TAKE 1 CAPSULE BY MOUTH DAILY   HYDROcodone-acetaminophen 10-325 MG tablet Commonly known as: NORCO Take 1 tablet by mouth every 8 (eight) hours. What changed:  when to take this reasons to take this        LABORATORY STUDIES CBC    Component Value Date/Time   WBC 7.2 04/03/2023 1907   RBC 4.49 04/03/2023 1907   HGB 13.3 04/03/2023 1907   HGB 13.7 04/03/2023 1907   HCT 39.0 04/03/2023 1907   HCT 40.4 04/03/2023 1907   PLT 324 04/03/2023 1907   MCV 90.0 04/03/2023 1907   MCH 30.5 04/03/2023 1907   MCHC 33.9 04/03/2023 1907   RDW 12.2 04/03/2023 1907   LYMPHSABS 1.6 04/03/2023 1907   MONOABS 0.4 04/03/2023 1907   EOSABS 0.2 04/03/2023 1907   BASOSABS 0.0 04/03/2023 1907   CMP    Component Value Date/Time   NA 139 04/03/2023 1907   NA 138 04/03/2023 1907   K  Stroke Discharge Summary  Patient ID: Mark Cohen   MRN: [id number]      DOB: September 24, 1950  Date of Admission: 04/03/2023 Date of Discharge: 04/06/2023  Attending Physician:  Stroke, Md, MD Consultant(s):     Interventional radiology   Patient's PCP:  Etta Grandchild, MD  DISCHARGE PRIMARY DIAGNOSIS:  Intracerebral Hemorrhage:  left parieto-occipital convexity ICH with surrounding vasogenic edema without significant mass effect or midline shift following Xarelto initiation 2 days PTA. S/p Andexxa administration for reversal.  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  versus hypertension in addition to anticoagulation with Xarelto initiation  Patient Active Problem List   Diagnosis Date Noted   ICH (intracerebral hemorrhage) (HCC) 04/03/2023   D-dimer, elevated 03/31/2023   Acute pulmonary embolism (HCC) 03/31/2023   Paroxysmal atrial fibrillation (HCC) 03/31/2023   Elevated troponin 03/31/2023   Dry mouth 03/29/2023   Atrial fibrillation with RVR (HCC) 03/29/2023   Elevated brain natriuretic peptide (BNP) level 03/29/2023   Sacro-iliac pain 02/03/2023   Chronic bilateral low back pain without sciatica 04/07/2022   Chronic pain of both hips 04/07/2022   Gastroesophageal reflux disease without esophagitis 10/12/2021   Polyp of colon 10/12/2021   Plantar fasciitis 04/18/2019   Benign prostatic hyperplasia without lower urinary tract symptoms 04/24/2018   Carpal tunnel syndrome on both sides 06/22/2017   Hypogonadism male 02/09/2017   Hyperlipidemia with target LDL less than 130 01/14/2016   GAD (generalized anxiety disorder) 01/21/2015   DJD (degenerative joint disease), multiple sites 05/10/2013   Left lumbar radiculitis 08/12/2011   Essential hypertension, benign 08/12/2011   Routine general medical examination at a health care facility 04/28/2011   ALLERGIC RHINITIS 01/29/2010     Secondary Diagnoses: Hypertension Atrial fibrillation 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 What changed: reasons to take this   DIALYVITE VITAMIN D 5000 PO Take 1 tablet by mouth daily.   diltiazem 120 MG 24 hr capsule Commonly known as: Cardizem CD Take 1 capsule (120 mg total) by mouth daily.   esomeprazole 40 MG capsule Commonly known as: NEXIUM TAKE 1 CAPSULE BY MOUTH DAILY   HYDROcodone-acetaminophen 10-325 MG tablet Commonly known as: NORCO Take 1 tablet by mouth every 8 (eight) hours. What changed:  when to take this reasons to take this        LABORATORY STUDIES CBC    Component Value Date/Time   WBC 7.2 04/03/2023 1907   RBC 4.49 04/03/2023 1907   HGB 13.3 04/03/2023 1907   HGB 13.7 04/03/2023 1907   HCT 39.0 04/03/2023 1907   HCT 40.4 04/03/2023 1907   PLT 324 04/03/2023 1907   MCV 90.0 04/03/2023 1907   MCH 30.5 04/03/2023 1907   MCHC 33.9 04/03/2023 1907   RDW 12.2 04/03/2023 1907   LYMPHSABS 1.6 04/03/2023 1907   MONOABS 0.4 04/03/2023 1907   EOSABS 0.2 04/03/2023 1907   BASOSABS 0.0 04/03/2023 1907   CMP    Component Value Date/Time   NA 139 04/03/2023 1907   NA 138 04/03/2023 1907   K  Stroke Discharge Summary  Patient ID: Mark Cohen   MRN: [id number]      DOB: September 24, 1950  Date of Admission: 04/03/2023 Date of Discharge: 04/06/2023  Attending Physician:  Stroke, Md, MD Consultant(s):     Interventional radiology   Patient's PCP:  Etta Grandchild, MD  DISCHARGE PRIMARY DIAGNOSIS:  Intracerebral Hemorrhage:  left parieto-occipital convexity ICH with surrounding vasogenic edema without significant mass effect or midline shift following Xarelto initiation 2 days PTA. S/p Andexxa administration for reversal.  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  versus hypertension in addition to anticoagulation with Xarelto initiation  Patient Active Problem List   Diagnosis Date Noted   ICH (intracerebral hemorrhage) (HCC) 04/03/2023   D-dimer, elevated 03/31/2023   Acute pulmonary embolism (HCC) 03/31/2023   Paroxysmal atrial fibrillation (HCC) 03/31/2023   Elevated troponin 03/31/2023   Dry mouth 03/29/2023   Atrial fibrillation with RVR (HCC) 03/29/2023   Elevated brain natriuretic peptide (BNP) level 03/29/2023   Sacro-iliac pain 02/03/2023   Chronic bilateral low back pain without sciatica 04/07/2022   Chronic pain of both hips 04/07/2022   Gastroesophageal reflux disease without esophagitis 10/12/2021   Polyp of colon 10/12/2021   Plantar fasciitis 04/18/2019   Benign prostatic hyperplasia without lower urinary tract symptoms 04/24/2018   Carpal tunnel syndrome on both sides 06/22/2017   Hypogonadism male 02/09/2017   Hyperlipidemia with target LDL less than 130 01/14/2016   GAD (generalized anxiety disorder) 01/21/2015   DJD (degenerative joint disease), multiple sites 05/10/2013   Left lumbar radiculitis 08/12/2011   Essential hypertension, benign 08/12/2011   Routine general medical examination at a health care facility 04/28/2011   ALLERGIC RHINITIS 01/29/2010     Secondary Diagnoses: Hypertension Atrial fibrillation 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 What changed: reasons to take this   DIALYVITE VITAMIN D 5000 PO Take 1 tablet by mouth daily.   diltiazem 120 MG 24 hr capsule Commonly known as: Cardizem CD Take 1 capsule (120 mg total) by mouth daily.   esomeprazole 40 MG capsule Commonly known as: NEXIUM TAKE 1 CAPSULE BY MOUTH DAILY   HYDROcodone-acetaminophen 10-325 MG tablet Commonly known as: NORCO Take 1 tablet by mouth every 8 (eight) hours. What changed:  when to take this reasons to take this        LABORATORY STUDIES CBC    Component Value Date/Time   WBC 7.2 04/03/2023 1907   RBC 4.49 04/03/2023 1907   HGB 13.3 04/03/2023 1907   HGB 13.7 04/03/2023 1907   HCT 39.0 04/03/2023 1907   HCT 40.4 04/03/2023 1907   PLT 324 04/03/2023 1907   MCV 90.0 04/03/2023 1907   MCH 30.5 04/03/2023 1907   MCHC 33.9 04/03/2023 1907   RDW 12.2 04/03/2023 1907   LYMPHSABS 1.6 04/03/2023 1907   MONOABS 0.4 04/03/2023 1907   EOSABS 0.2 04/03/2023 1907   BASOSABS 0.0 04/03/2023 1907   CMP    Component Value Date/Time   NA 139 04/03/2023 1907   NA 138 04/03/2023 1907   K  greater), or occlusion. Vertebral arteries: Codominant. No evidence of dissection, stenosis (50% or greater), or occlusion. Skeleton: Mild cervical spondylosis without high-grade spinal canal stenosis. Other neck: Unremarkable. Upper chest: Unremarkable. Review of the MIP images confirms the above findings CTA HEAD FINDINGS Anterior circulation: Intracranial ICAs are patent without stenosis or aneurysm. The proximal ACAs and MCAs are patent without stenosis, aneurysm or vascular malformation. Distal branches are symmetric. Posterior circulation: Normal basilar artery. The SCAs, AICAs and PICAs are patent proximally. The PCAs are patent proximally without stenosis, aneurysm or vascular malformation. Distal branches are symmetric. Venous sinuses: As permitted by contrast timing, patent. Anatomic variants: Hypoplastic right A1 segment. Review of the MIP images confirms the above findings IMPRESSION: No large vessel occlusion, hemodynamically significant stenosis, aneurysm, or vascular malformation in the head or neck. Electronically Signed   By: Orvan Falconer M.D.   On: 04/03/2023 19:41   CT HEAD CODE STROKE WO CONTRAST  Result Date: 04/03/2023 CLINICAL DATA:  Code stroke. Initial evaluation for neuro deficit, stroke suspected. EXAM: CT HEAD WITHOUT CONTRAST TECHNIQUE: Contiguous axial images were obtained from the base of the skull through the vertex without intravenous contrast. RADIATION DOSE REDUCTION: This exam was performed according to the departmental dose-optimization program which includes automated exposure control, adjustment of the mA and/or kV according to patient size and/or use of iterative reconstruction technique. COMPARISON:  Prior study from 06/03/2005. FINDINGS: Brain: Acute intraparenchymal hematoma centered at the left parieto-occipital convexity measures 5.3 x 4.0 x 3.0 cm (estimated volume 32 mL). Surrounding vasogenic edema without significant regional mass effect or midline shift. No  intraventricular or extra-axial extension. No other acute intracranial hemorrhage. No other large vessel territory infarct. Underlying mild chronic microvascular ischemic disease noted. No midline shift or hydrocephalus. Vascular: No abnormal hyperdense vessel. Skull: Scalp soft tissues and calvarium demonstrate no acute finding. Sinuses/Orbits: Globes and orbital soft tissues within normal limits. Scattered mucosal thickening present about the ethmoidal air cells. No mastoid effusion. Other: None. ASPECTS [facility] Stroke Program Early CT Score) Does not apply, acute ICH. IMPRESSION: 5.3 x 4.0 x 3.0 cm acute intraparenchymal hematoma centered at the left parieto-occipital convexity (estimated volume 32 mL). Surrounding vasogenic edema without significant regional mass effect or midline shift. These results were communicated to Dr. Derry Lory at 7:19 pm on 04/03/2023 by text page via the [facility] messaging system. Electronically Signed   By: Rise Mu M.D.   On: 04/03/2023 19:20   VAS [place] LOWER EXTREMITY VENOUS (DVT)  Result Date: 04/01/2023  Lower Venous DVT Study Patient Name:  Mark Cohen  Date of Exam:   04/01/2023 Medical Rec #: [id number]          Accession #:    [id number] Date of Birth: 1951-04-16         Patient Gender: M Patient Age:   72 years Exam Location:  [facility] Procedure:      VAS [place] LOWER EXTREMITY VENOUS (DVT) Referring Phys: CHING TU --------------------------------------------------------------------------------  Indications: Pulmonary embolism.  Comparison Study: No prior study on file Performing Technologist: Sherren Kerns RVS  Examination Guidelines: A complete evaluation includes B-mode imaging, spectral Doppler, color Doppler, and power Doppler as needed of all accessible portions of each vessel. Bilateral testing is considered an integral part of a complete examination. Limited examinations for reoccurring indications may be performed as noted. The reflux portion  of the exam is performed with the patient in reverse Trendelenburg.  +---------+---------------+---------+-----------+----------+--------------+ RIGHT    CompressibilityPhasicitySpontaneityPropertiesThrombus Aging +---------+---------------+---------+-----------+----------+--------------+ CFV  Stroke Discharge Summary  Patient ID: Mark Cohen   MRN: [id number]      DOB: September 24, 1950  Date of Admission: 04/03/2023 Date of Discharge: 04/06/2023  Attending Physician:  Stroke, Md, MD Consultant(s):     Interventional radiology   Patient's PCP:  Etta Grandchild, MD  DISCHARGE PRIMARY DIAGNOSIS:  Intracerebral Hemorrhage:  left parieto-occipital convexity ICH with surrounding vasogenic edema without significant mass effect or midline shift following Xarelto initiation 2 days PTA. S/p Andexxa administration for reversal.  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  versus hypertension in addition to anticoagulation with Xarelto initiation  Patient Active Problem List   Diagnosis Date Noted   ICH (intracerebral hemorrhage) (HCC) 04/03/2023   D-dimer, elevated 03/31/2023   Acute pulmonary embolism (HCC) 03/31/2023   Paroxysmal atrial fibrillation (HCC) 03/31/2023   Elevated troponin 03/31/2023   Dry mouth 03/29/2023   Atrial fibrillation with RVR (HCC) 03/29/2023   Elevated brain natriuretic peptide (BNP) level 03/29/2023   Sacro-iliac pain 02/03/2023   Chronic bilateral low back pain without sciatica 04/07/2022   Chronic pain of both hips 04/07/2022   Gastroesophageal reflux disease without esophagitis 10/12/2021   Polyp of colon 10/12/2021   Plantar fasciitis 04/18/2019   Benign prostatic hyperplasia without lower urinary tract symptoms 04/24/2018   Carpal tunnel syndrome on both sides 06/22/2017   Hypogonadism male 02/09/2017   Hyperlipidemia with target LDL less than 130 01/14/2016   GAD (generalized anxiety disorder) 01/21/2015   DJD (degenerative joint disease), multiple sites 05/10/2013   Left lumbar radiculitis 08/12/2011   Essential hypertension, benign 08/12/2011   Routine general medical examination at a health care facility 04/28/2011   ALLERGIC RHINITIS 01/29/2010     Secondary Diagnoses: Hypertension Atrial fibrillation 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 What changed: reasons to take this   DIALYVITE VITAMIN D 5000 PO Take 1 tablet by mouth daily.   diltiazem 120 MG 24 hr capsule Commonly known as: Cardizem CD Take 1 capsule (120 mg total) by mouth daily.   esomeprazole 40 MG capsule Commonly known as: NEXIUM TAKE 1 CAPSULE BY MOUTH DAILY   HYDROcodone-acetaminophen 10-325 MG tablet Commonly known as: NORCO Take 1 tablet by mouth every 8 (eight) hours. What changed:  when to take this reasons to take this        LABORATORY STUDIES CBC    Component Value Date/Time   WBC 7.2 04/03/2023 1907   RBC 4.49 04/03/2023 1907   HGB 13.3 04/03/2023 1907   HGB 13.7 04/03/2023 1907   HCT 39.0 04/03/2023 1907   HCT 40.4 04/03/2023 1907   PLT 324 04/03/2023 1907   MCV 90.0 04/03/2023 1907   MCH 30.5 04/03/2023 1907   MCHC 33.9 04/03/2023 1907   RDW 12.2 04/03/2023 1907   LYMPHSABS 1.6 04/03/2023 1907   MONOABS 0.4 04/03/2023 1907   EOSABS 0.2 04/03/2023 1907   BASOSABS 0.0 04/03/2023 1907   CMP    Component Value Date/Time   NA 139 04/03/2023 1907   NA 138 04/03/2023 1907   K  Stroke Discharge Summary  Patient ID: Mark Cohen   MRN: [id number]      DOB: September 24, 1950  Date of Admission: 04/03/2023 Date of Discharge: 04/06/2023  Attending Physician:  Stroke, Md, MD Consultant(s):     Interventional radiology   Patient's PCP:  Etta Grandchild, MD  DISCHARGE PRIMARY DIAGNOSIS:  Intracerebral Hemorrhage:  left parieto-occipital convexity ICH with surrounding vasogenic edema without significant mass effect or midline shift following Xarelto initiation 2 days PTA. S/p Andexxa administration for reversal.  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  versus hypertension in addition to anticoagulation with Xarelto initiation  Patient Active Problem List   Diagnosis Date Noted   ICH (intracerebral hemorrhage) (HCC) 04/03/2023   D-dimer, elevated 03/31/2023   Acute pulmonary embolism (HCC) 03/31/2023   Paroxysmal atrial fibrillation (HCC) 03/31/2023   Elevated troponin 03/31/2023   Dry mouth 03/29/2023   Atrial fibrillation with RVR (HCC) 03/29/2023   Elevated brain natriuretic peptide (BNP) level 03/29/2023   Sacro-iliac pain 02/03/2023   Chronic bilateral low back pain without sciatica 04/07/2022   Chronic pain of both hips 04/07/2022   Gastroesophageal reflux disease without esophagitis 10/12/2021   Polyp of colon 10/12/2021   Plantar fasciitis 04/18/2019   Benign prostatic hyperplasia without lower urinary tract symptoms 04/24/2018   Carpal tunnel syndrome on both sides 06/22/2017   Hypogonadism male 02/09/2017   Hyperlipidemia with target LDL less than 130 01/14/2016   GAD (generalized anxiety disorder) 01/21/2015   DJD (degenerative joint disease), multiple sites 05/10/2013   Left lumbar radiculitis 08/12/2011   Essential hypertension, benign 08/12/2011   Routine general medical examination at a health care facility 04/28/2011   ALLERGIC RHINITIS 01/29/2010     Secondary Diagnoses: Hypertension Atrial fibrillation 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 What changed: reasons to take this   DIALYVITE VITAMIN D 5000 PO Take 1 tablet by mouth daily.   diltiazem 120 MG 24 hr capsule Commonly known as: Cardizem CD Take 1 capsule (120 mg total) by mouth daily.   esomeprazole 40 MG capsule Commonly known as: NEXIUM TAKE 1 CAPSULE BY MOUTH DAILY   HYDROcodone-acetaminophen 10-325 MG tablet Commonly known as: NORCO Take 1 tablet by mouth every 8 (eight) hours. What changed:  when to take this reasons to take this        LABORATORY STUDIES CBC    Component Value Date/Time   WBC 7.2 04/03/2023 1907   RBC 4.49 04/03/2023 1907   HGB 13.3 04/03/2023 1907   HGB 13.7 04/03/2023 1907   HCT 39.0 04/03/2023 1907   HCT 40.4 04/03/2023 1907   PLT 324 04/03/2023 1907   MCV 90.0 04/03/2023 1907   MCH 30.5 04/03/2023 1907   MCHC 33.9 04/03/2023 1907   RDW 12.2 04/03/2023 1907   LYMPHSABS 1.6 04/03/2023 1907   MONOABS 0.4 04/03/2023 1907   EOSABS 0.2 04/03/2023 1907   BASOSABS 0.0 04/03/2023 1907   CMP    Component Value Date/Time   NA 139 04/03/2023 1907   NA 138 04/03/2023 1907   K  Stroke Discharge Summary  Patient ID: Mark Cohen   MRN: [id number]      DOB: September 24, 1950  Date of Admission: 04/03/2023 Date of Discharge: 04/06/2023  Attending Physician:  Stroke, Md, MD Consultant(s):     Interventional radiology   Patient's PCP:  Etta Grandchild, MD  DISCHARGE PRIMARY DIAGNOSIS:  Intracerebral Hemorrhage:  left parieto-occipital convexity ICH with surrounding vasogenic edema without significant mass effect or midline shift following Xarelto initiation 2 days PTA. S/p Andexxa administration for reversal.  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  versus hypertension in addition to anticoagulation with Xarelto initiation  Patient Active Problem List   Diagnosis Date Noted   ICH (intracerebral hemorrhage) (HCC) 04/03/2023   D-dimer, elevated 03/31/2023   Acute pulmonary embolism (HCC) 03/31/2023   Paroxysmal atrial fibrillation (HCC) 03/31/2023   Elevated troponin 03/31/2023   Dry mouth 03/29/2023   Atrial fibrillation with RVR (HCC) 03/29/2023   Elevated brain natriuretic peptide (BNP) level 03/29/2023   Sacro-iliac pain 02/03/2023   Chronic bilateral low back pain without sciatica 04/07/2022   Chronic pain of both hips 04/07/2022   Gastroesophageal reflux disease without esophagitis 10/12/2021   Polyp of colon 10/12/2021   Plantar fasciitis 04/18/2019   Benign prostatic hyperplasia without lower urinary tract symptoms 04/24/2018   Carpal tunnel syndrome on both sides 06/22/2017   Hypogonadism male 02/09/2017   Hyperlipidemia with target LDL less than 130 01/14/2016   GAD (generalized anxiety disorder) 01/21/2015   DJD (degenerative joint disease), multiple sites 05/10/2013   Left lumbar radiculitis 08/12/2011   Essential hypertension, benign 08/12/2011   Routine general medical examination at a health care facility 04/28/2011   ALLERGIC RHINITIS 01/29/2010     Secondary Diagnoses: Hypertension Atrial fibrillation 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 What changed: reasons to take this   DIALYVITE VITAMIN D 5000 PO Take 1 tablet by mouth daily.   diltiazem 120 MG 24 hr capsule Commonly known as: Cardizem CD Take 1 capsule (120 mg total) by mouth daily.   esomeprazole 40 MG capsule Commonly known as: NEXIUM TAKE 1 CAPSULE BY MOUTH DAILY   HYDROcodone-acetaminophen 10-325 MG tablet Commonly known as: NORCO Take 1 tablet by mouth every 8 (eight) hours. What changed:  when to take this reasons to take this        LABORATORY STUDIES CBC    Component Value Date/Time   WBC 7.2 04/03/2023 1907   RBC 4.49 04/03/2023 1907   HGB 13.3 04/03/2023 1907   HGB 13.7 04/03/2023 1907   HCT 39.0 04/03/2023 1907   HCT 40.4 04/03/2023 1907   PLT 324 04/03/2023 1907   MCV 90.0 04/03/2023 1907   MCH 30.5 04/03/2023 1907   MCHC 33.9 04/03/2023 1907   RDW 12.2 04/03/2023 1907   LYMPHSABS 1.6 04/03/2023 1907   MONOABS 0.4 04/03/2023 1907   EOSABS 0.2 04/03/2023 1907   BASOSABS 0.0 04/03/2023 1907   CMP    Component Value Date/Time   NA 139 04/03/2023 1907   NA 138 04/03/2023 1907   K  Stroke Discharge Summary  Patient ID: Mark Cohen   MRN: [id number]      DOB: September 24, 1950  Date of Admission: 04/03/2023 Date of Discharge: 04/06/2023  Attending Physician:  Stroke, Md, MD Consultant(s):     Interventional radiology   Patient's PCP:  Etta Grandchild, MD  DISCHARGE PRIMARY DIAGNOSIS:  Intracerebral Hemorrhage:  left parieto-occipital convexity ICH with surrounding vasogenic edema without significant mass effect or midline shift following Xarelto initiation 2 days PTA. S/p Andexxa administration for reversal.  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  versus hypertension in addition to anticoagulation with Xarelto initiation  Patient Active Problem List   Diagnosis Date Noted   ICH (intracerebral hemorrhage) (HCC) 04/03/2023   D-dimer, elevated 03/31/2023   Acute pulmonary embolism (HCC) 03/31/2023   Paroxysmal atrial fibrillation (HCC) 03/31/2023   Elevated troponin 03/31/2023   Dry mouth 03/29/2023   Atrial fibrillation with RVR (HCC) 03/29/2023   Elevated brain natriuretic peptide (BNP) level 03/29/2023   Sacro-iliac pain 02/03/2023   Chronic bilateral low back pain without sciatica 04/07/2022   Chronic pain of both hips 04/07/2022   Gastroesophageal reflux disease without esophagitis 10/12/2021   Polyp of colon 10/12/2021   Plantar fasciitis 04/18/2019   Benign prostatic hyperplasia without lower urinary tract symptoms 04/24/2018   Carpal tunnel syndrome on both sides 06/22/2017   Hypogonadism male 02/09/2017   Hyperlipidemia with target LDL less than 130 01/14/2016   GAD (generalized anxiety disorder) 01/21/2015   DJD (degenerative joint disease), multiple sites 05/10/2013   Left lumbar radiculitis 08/12/2011   Essential hypertension, benign 08/12/2011   Routine general medical examination at a health care facility 04/28/2011   ALLERGIC RHINITIS 01/29/2010     Secondary Diagnoses: Hypertension Atrial fibrillation 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 What changed: reasons to take this   DIALYVITE VITAMIN D 5000 PO Take 1 tablet by mouth daily.   diltiazem 120 MG 24 hr capsule Commonly known as: Cardizem CD Take 1 capsule (120 mg total) by mouth daily.   esomeprazole 40 MG capsule Commonly known as: NEXIUM TAKE 1 CAPSULE BY MOUTH DAILY   HYDROcodone-acetaminophen 10-325 MG tablet Commonly known as: NORCO Take 1 tablet by mouth every 8 (eight) hours. What changed:  when to take this reasons to take this        LABORATORY STUDIES CBC    Component Value Date/Time   WBC 7.2 04/03/2023 1907   RBC 4.49 04/03/2023 1907   HGB 13.3 04/03/2023 1907   HGB 13.7 04/03/2023 1907   HCT 39.0 04/03/2023 1907   HCT 40.4 04/03/2023 1907   PLT 324 04/03/2023 1907   MCV 90.0 04/03/2023 1907   MCH 30.5 04/03/2023 1907   MCHC 33.9 04/03/2023 1907   RDW 12.2 04/03/2023 1907   LYMPHSABS 1.6 04/03/2023 1907   MONOABS 0.4 04/03/2023 1907   EOSABS 0.2 04/03/2023 1907   BASOSABS 0.0 04/03/2023 1907   CMP    Component Value Date/Time   NA 139 04/03/2023 1907   NA 138 04/03/2023 1907   K  greater), or occlusion. Vertebral arteries: Codominant. No evidence of dissection, stenosis (50% or greater), or occlusion. Skeleton: Mild cervical spondylosis without high-grade spinal canal stenosis. Other neck: Unremarkable. Upper chest: Unremarkable. Review of the MIP images confirms the above findings CTA HEAD FINDINGS Anterior circulation: Intracranial ICAs are patent without stenosis or aneurysm. The proximal ACAs and MCAs are patent without stenosis, aneurysm or vascular malformation. Distal branches are symmetric. Posterior circulation: Normal basilar artery. The SCAs, AICAs and PICAs are patent proximally. The PCAs are patent proximally without stenosis, aneurysm or vascular malformation. Distal branches are symmetric. Venous sinuses: As permitted by contrast timing, patent. Anatomic variants: Hypoplastic right A1 segment. Review of the MIP images confirms the above findings IMPRESSION: No large vessel occlusion, hemodynamically significant stenosis, aneurysm, or vascular malformation in the head or neck. Electronically Signed   By: Orvan Falconer M.D.   On: 04/03/2023 19:41   CT HEAD CODE STROKE WO CONTRAST  Result Date: 04/03/2023 CLINICAL DATA:  Code stroke. Initial evaluation for neuro deficit, stroke suspected. EXAM: CT HEAD WITHOUT CONTRAST TECHNIQUE: Contiguous axial images were obtained from the base of the skull through the vertex without intravenous contrast. RADIATION DOSE REDUCTION: This exam was performed according to the departmental dose-optimization program which includes automated exposure control, adjustment of the mA and/or kV according to patient size and/or use of iterative reconstruction technique. COMPARISON:  Prior study from 06/03/2005. FINDINGS: Brain: Acute intraparenchymal hematoma centered at the left parieto-occipital convexity measures 5.3 x 4.0 x 3.0 cm (estimated volume 32 mL). Surrounding vasogenic edema without significant regional mass effect or midline shift. No  intraventricular or extra-axial extension. No other acute intracranial hemorrhage. No other large vessel territory infarct. Underlying mild chronic microvascular ischemic disease noted. No midline shift or hydrocephalus. Vascular: No abnormal hyperdense vessel. Skull: Scalp soft tissues and calvarium demonstrate no acute finding. Sinuses/Orbits: Globes and orbital soft tissues within normal limits. Scattered mucosal thickening present about the ethmoidal air cells. No mastoid effusion. Other: None. ASPECTS [facility] Stroke Program Early CT Score) Does not apply, acute ICH. IMPRESSION: 5.3 x 4.0 x 3.0 cm acute intraparenchymal hematoma centered at the left parieto-occipital convexity (estimated volume 32 mL). Surrounding vasogenic edema without significant regional mass effect or midline shift. These results were communicated to Dr. Derry Lory at 7:19 pm on 04/03/2023 by text page via the [facility] messaging system. Electronically Signed   By: Rise Mu M.D.   On: 04/03/2023 19:20   VAS [place] LOWER EXTREMITY VENOUS (DVT)  Result Date: 04/01/2023  Lower Venous DVT Study Patient Name:  Mark Cohen  Date of Exam:   04/01/2023 Medical Rec #: [id number]          Accession #:    [id number] Date of Birth: 1951-04-16         Patient Gender: M Patient Age:   72 years Exam Location:  [facility] Procedure:      VAS [place] LOWER EXTREMITY VENOUS (DVT) Referring Phys: CHING TU --------------------------------------------------------------------------------  Indications: Pulmonary embolism.  Comparison Study: No prior study on file Performing Technologist: Sherren Kerns RVS  Examination Guidelines: A complete evaluation includes B-mode imaging, spectral Doppler, color Doppler, and power Doppler as needed of all accessible portions of each vessel. Bilateral testing is considered an integral part of a complete examination. Limited examinations for reoccurring indications may be performed as noted. The reflux portion  of the exam is performed with the patient in reverse Trendelenburg.  +---------+---------------+---------+-----------+----------+--------------+ RIGHT    CompressibilityPhasicitySpontaneityPropertiesThrombus Aging +---------+---------------+---------+-----------+----------+--------------+ CFV

## 2023-04-06 NOTE — Care Management Important Message (Signed)
Important Message  Patient Details  Name: Mark Cohen MRN: [id number] Date of Birth: January 11, 1951   Important Message Given:  Yes - Medicare IM     Dorena Bodo 04/06/2023, 2:53 PM

## 2023-04-07 ENCOUNTER — Inpatient Hospital Stay: Payer: Medicare HMO | Admitting: Internal Medicine

## 2023-04-07 ENCOUNTER — Telehealth: Payer: Self-pay | Admitting: *Deleted

## 2023-04-07 NOTE — Patient Instructions (Signed)
Visit Information  Thank you for taking time to visit with me today. Please don't hesitate to contact me if I can be of assistance to you before our next scheduled telephone appointment.  Our next appointment is by telephone on Thursday, October 3 at 3:00 pm  Caryl Pina, RN, BSN, CCRN Alumnus RN CM Care Coordination/ Transition of Care- [facility] [place] [phone]: direct office    Following is a copy of your care plan:   Goals Addressed             This Visit's Progress    Client understands the importance of follow-up with providers by attending scheduled visits   On track    [facility] 30-day Program Care Plan   On track    Current Barriers:  Chronic Disease Management support and education needs related to Atrial Fibrillation   RNCM Clinical Goal(s):  Patient will verbalize understanding of plan for management of Atrial Fibrillation as evidenced by independent reporting of same within 30-day period post-recent hospital discharge take all medications exactly as prescribed and will call provider for medication related questions as evidenced by patient reporting of same during 30-day RN CM TOC program follow up outreach visits attend all scheduled medical appointments: PCP; outpatient rehabilitation, and neurology provider as evidenced by review of same during 30-day RN CM TOC program follow up outreach visits continue to work with RN Care Manager to address care management and care coordination needs related to  Atrial Fibrillation as evidenced by adherence to CM Team Scheduled appointments through collaboration with RN Care manager, provider, and care team.   Interventions: Evaluation of current treatment plan related to  self management and patient's adherence to plan as established by provider  AFIB Interventions: (Status:  New goal.) Short Term Goal   Advised patient to discuss future resumption of ACT  with provider Assessed social determinant of health  barriers Confirmed patient has stopped taking ACT (Xarelto) due to recent ICH, and understands to continue taking diltiazem as per hospital discharging provider instructions  Stroke:  (Status:New goal.) Short Term Goal Reviewed Importance of taking all medications as prescribed Reviewed Importance of attending all scheduled provider appointments Advised to report any changes in symptoms or exercise tolerance Assessed social determinant of health barriers Reviewed referrals to outpatient therapy Initiated education around heart healthy low salt diet; assessed reported ongoing visual changes/ confirmed no need for assistive devices  Patient Goals/Self-Care Activities: Participate in [facility] Notify RN Care Manager of [facility] call rescheduling needs Take all medications as prescribed Attend all scheduled provider appointments Call provider office for new concerns or questions  Next week-- Call the outpatient rehabilitation facility to initiate scheduling of Joe's outpatient PT/ rehabilitation; call Dr. Marlis Edelson office to go ahead and schedule the appointment with him in 8 weeks  Follow Up Plan:  Telephone follow up appointment with care management team member scheduled for:  Thursday, April 13, 2023 at 3:00 pm          Patient verbalizes understanding of instructions and care plan provided today and agrees to view in [place]. Active MyChart status and patient understanding of how to access instructions and care plan via MyChart confirmed with patient.     Telephone follow up appointment with care management team member scheduled for:  Thursday, October 3 at 3:00 pm  Please call the care guide team at [phone] if you need to cancel or reschedule your appointment.   Please call the Suicide and  Crisis Lifeline: 988 call the [place] National Suicide Prevention Lifeline: (701)003-4985 or TTY: 605-045-8108 TTY 6068779226) to talk to a trained  counselor call 1-800-273-TALK (toll free, 24 hour hotline) go to [facility] [address], [place] [phone]) call the [facility] Crisis Line: 432-158-6379 call 911 if you are experiencing a Mental Health or Behavioral Health Crisis or need someone to talk to.  Caryl Pina, RN, BSN, CCRN Alumnus RN CM Care Coordination/ Transition of Care- [facility] [place] [phone]: direct office

## 2023-04-07 NOTE — Transitions of Care (Post Inpatient/ED Visit) (Signed)
04/07/2023  Name: Mark Cohen MRN: [id number] DOB: 05/26/1951  Today's TOC FU Call Status: Today's TOC FU Call Status:: Successful TOC FU Call Completed TOC FU Call Complete Date: 04/07/23 Patient's Name and Date of Birth confirmed.  Transition Care Management Follow-up Telephone Call Date of Discharge: 04/06/23 Discharge Facility: [facility]) Type of Discharge: Inpatient Admission Primary Inpatient Discharge Diagnosis:: ICH after initiation of Xarelto 2 days prior How have you been since you were released from the hospital?: Same ("I am doing okay.  Other than my vision not being quite back to normal, I am about back to my normal self; Mark Cohen is helping me and supervising everything.  Thanks for telling me to continue taking the diltiazem, I thought I was to stop taking it") Any questions or concerns?: Yes Patient Questions/Concerns:: Patient/ spouse thought they were to discontinue taking diltiazem post-recent hospital discharge on 04/06/23- they had not yet reviewed printed discharge instructions/ AVS from 04/07/23 Patient Questions/Concerns Addressed: Other: (thoroughly reviewed post-hospital discharge instructions and provider discharge notes with patient and spouse: confirmed they understand to stop Xarelto, and to continue diltiazem; confirmed they have diltiazem at their home- states he will take "now")  Items Reviewed: Did you receive and understand the discharge instructions provided?: Yes (thoroughly reviewed with patient/ spouse who verbalizes good understanding of same) Medications obtained,verified, and reconciled?: Yes (Medications Reviewed) (Full medication reconciliation/ review completed; no concerns or discrepancies identified; confirmed patient obtained/ is taking all newly Rx'd medications as instructed; self-manages medications and denies questions/ concerns around medications today) Any new allergies since your discharge?: No Dietary orders reviewed?: Yes Type  of Diet Ordered:: Heart Healthy- Low salt Do you have support at home?: Yes People in Home: spouse Name of Support/Comfort Primary Source: Reports independent in self-care activities at baseline; supportive spouse assists as/ if needed/ indicated post-hospital discharge on 04/07/23  Medications Reviewed Today: Medications Reviewed Today     Reviewed by Michaela Corner, RN (Registered Nurse) on 04/07/23 at 1523  Med List Status: <None>   Medication Order Taking? Sig Documenting Provider Last Dose Status Informant  ALPRAZolam (XANAX) 0.25 MG tablet [id number] Yes Take 1 tablet (0.25 mg total) by mouth 2 (two) times daily as needed.  Patient taking differently: Take 0.25 mg by mouth 2 (two) times daily as needed for anxiety.   Etta Grandchild, MD Taking Active Self, Spouse/Significant Other  Cholecalciferol (DIALYVITE VITAMIN D 5000 PO) [id number] Yes Take 1 tablet by mouth daily. [provider] Taking Active Self, Spouse/Significant Other  diltiazem (CARDIZEM CD) 120 MG 24 hr capsule [id number] Yes Take 1 capsule (120 mg total) by mouth daily. Etta Grandchild, MD Taking Active Self, Spouse/Significant Other           Med Note Michaela Corner   Fri Apr 07, 2023  3:23 PM) 04/07/23: Reported during [facility] call that patient/ spouse thought this medication was discontinued 04/06/23--- thoroughly reviewed post-hospital discharge instructions and provider discharge notes with them: patient/ spouse confirm they have this medication at their home and will resume taking as per post-hospital discharge instructions from 04/06/23  esomeprazole (NEXIUM) 40 MG capsule [id number] Yes TAKE 1 CAPSULE BY MOUTH DAILY Etta Grandchild, MD Taking Active Self, Spouse/Significant Other           Med Note Kandis Cocking Kirt Boys Apr 03, 2023  9:25 PM) Patient states that he takes 40mg  daily every other day, and takes 20mg  on the days between.  HYDROcodone-acetaminophen (NORCO) 10-325  MG tablet [id number] Yes Take  1 tablet by mouth every 8 (eight) hours.  Patient taking differently: Take 1 tablet by mouth every 6 (six) hours as needed for moderate pain.   Etta Grandchild, MD Taking Active Self, Spouse/Significant Other            Home Care and Equipment/Supplies: Were Home Health Services Ordered?: No Any new equipment or medical supplies ordered?: No  Functional Questionnaire: Do you need assistance with bathing/showering or dressing?: No (wife assisting/ supervising as indicated) Do you need assistance with meal preparation?: Yes (wife manages most aspects of meal preparation) Do you need assistance with eating?: No Do you have difficulty maintaining continence: No Do you need assistance with getting out of bed/getting out of a chair/moving?: No (wife assisting/ supervising as indicated) Do you have difficulty managing or taking your medications?: No (wife assisting/ supervising as indicated)  Follow up appointments reviewed: PCP Follow-up appointment confirmed?: Yes Date of PCP follow-up appointment?: 04/17/23 Follow-up Provider: PCP- Dr. Yetta Barre Specialist [facility] Follow-up appointment confirmed?: No (verified neurology provider HFU office visit recommended at 8 weeks post-hospital discharge) Reason Specialist Follow-Up Not Confirmed: Patient has Specialist Provider Number and will Call for Appointment Do you need transportation to your follow-up appointment?: No Do you understand care options if your condition(s) worsen?: Yes-patient verbalized understanding  SDOH Interventions Today    Flowsheet Row Most Recent Value  SDOH Interventions   Food Insecurity Interventions Intervention Not Indicated  Transportation Interventions Intervention Not Indicated  [normally drives self [place],  wife assisting after recent hospital discharge]       Goals Addressed             This Visit's Progress    Client understands the importance of follow-up with providers by attending  scheduled visits   On track    [facility] 30-day Program Care Plan   On track    Current Barriers:  Chronic Disease Management support and education needs related to Atrial Fibrillation   RNCM Clinical Goal(s):  Patient will verbalize understanding of plan for management of Atrial Fibrillation as evidenced by independent reporting of same within 30-day period post-recent hospital discharge take all medications exactly as prescribed and will call provider for medication related questions as evidenced by patient reporting of same during 30-day RN CM TOC program follow up outreach visits attend all scheduled medical appointments: PCP; outpatient rehabilitation, and neurology provider as evidenced by review of same during 30-day RN CM TOC program follow up outreach visits continue to work with RN Care Manager to address care management and care coordination needs related to  Atrial Fibrillation as evidenced by adherence to CM Team Scheduled appointments through collaboration with RN Care manager, provider, and care team.   Interventions: Evaluation of current treatment plan related to  self management and patient's adherence to plan as established by provider  AFIB Interventions: (Status:  New goal.) Short Term Goal   Advised patient to discuss future resumption of ACT  with provider Assessed social determinant of health barriers Confirmed patient has stopped taking ACT (Xarelto) due to recent ICH, and understands to continue taking diltiazem as per hospital discharging provider instructions  Stroke:  (Status:New goal.) Short Term Goal Reviewed Importance of taking all medications as prescribed Reviewed Importance of attending all scheduled provider appointments Advised to report any changes in symptoms or exercise tolerance Assessed social determinant of health barriers Reviewed referrals to outpatient therapy Initiated education around heart healthy low salt diet; assessed reported ongoing visual  changes/ confirmed no need for assistive devices  Patient Goals/Self-Care Activities: Participate in Transition of Care Program/Attend [facility] scheduled calls Notify RN Care Manager of TOC call rescheduling needs Take all medications as prescribed Attend all scheduled provider appointments Call provider office for new concerns or questions  Next week-- Call the outpatient rehabilitation facility to initiate scheduling of Joe's outpatient PT/ rehabilitation; call Dr. Marlis Edelson office to go ahead and schedule the appointment with him in 8 weeks  Follow Up Plan:  Telephone follow up appointment with care management team member scheduled for:  Thursday, April 13, 2023 at 3:00 pm          Caryl Pina, RN, BSN, CCRN Alumnus RN CM Care Coordination/ Transition of Care- [facility] Care Management [phone]: direct office

## 2023-04-13 ENCOUNTER — Telehealth: Payer: Self-pay | Admitting: *Deleted

## 2023-04-13 ENCOUNTER — Encounter: Payer: Self-pay | Admitting: *Deleted

## 2023-04-13 NOTE — Patient Outreach (Signed)
  Care Management  Transitions of Care Program Transitions of Care Post-discharge week 2- unsuccessful attempt  04/13/2023 Name: Mark Cohen MRN: [id number] DOB: Sep 11, 1950  Subjective: Mark Cohen is a 72 y.o. year old male who is a primary care patient of Etta Grandchild, MD. The Care Management team was unable to reach the patient by phone to assess and address transitions of care needs.   Plan: Additional outreach attempts will be made to reach the patient enrolled in the [facility] Program (Post Inpatient/ED Visit).  Caryl Pina, RN, BSN, Media planner  Transitions of Care  [facility] - [facility] [phone]: direct office

## 2023-04-14 ENCOUNTER — Telehealth: Payer: Self-pay | Admitting: *Deleted

## 2023-04-14 NOTE — Patient Outreach (Signed)
Care Management  Transitions of Care Program Transitions of Care Post-discharge week 2   04/14/2023 Name: Mark Cohen MRN: [id number] DOB: 01-Nov-1950  Subjective: Mark Cohen is a 72 y.o. year old male who is a primary care patient of Etta Grandchild, MD. The Care Management team Engaged with patient Engaged with patient by telephone to assess and address transitions of care needs.   Consent to Services:  04/14/23:  Patient/Caregiver- spouse declined further follow-up/ participation in [facility]- 30 day program: care plan completed accordingly  Assessment: Patient's spouse returned my calls from yesterday and earlier today; patient and spouse decline ongoing participation in [facility] 30-day program; states "it's too much, weekly calls, he is doing okay and we just don't have time for it"        SDOH Interventions    Flowsheet Row Telephone from 04/07/2023 in [facility] Care Coordination Clinical Support from 02/01/2023 in [facility] [place]  SDOH Interventions    Food Insecurity Interventions Intervention Not Indicated Intervention Not Indicated  Housing Interventions -- Intervention Not Indicated  Transportation Interventions Intervention Not Indicated  [normally drives self [place],  wife assisting after recent hospital discharge] Intervention Not Indicated  Utilities Interventions -- Intervention Not Indicated  Alcohol Usage Interventions -- Intervention Not Indicated (Score <7)  Depression Interventions/Treatment  -- AYT0-1 Score <4 Follow-up Not Indicated  Financial Strain Interventions -- Intervention Not Indicated  Physical Activity Interventions -- Intervention Not Indicated  Stress Interventions -- Intervention Not Indicated  Social Connections Interventions -- Intervention Not Indicated  Health Literacy Interventions -- Intervention Not Indicated        Goals Addressed             This Visit's Progress    TOC  30-day Program Care Plan       04/14/23:  Patient/Caregiver- spouse declined further follow-up/ participation in [facility]- 30 day program   Current Barriers:  Chronic Disease Management support and education needs related to Atrial Fibrillation   RNCM Clinical Goal(s):  Patient will verbalize understanding of plan for management of Atrial Fibrillation as evidenced by independent reporting of same within 30-day period post-recent hospital discharge take all medications exactly as prescribed and will call provider for medication related questions as evidenced by patient reporting of same during 30-day RN CM TOC program follow up outreach visits attend all scheduled medical appointments: PCP; outpatient rehabilitation, and neurology provider as evidenced by review of same during 30-day RN CM TOC program follow up outreach visits continue to work with RN Care Manager to address care management and care coordination needs related to  Atrial Fibrillation as evidenced by adherence to CM Team Scheduled appointments through collaboration with RN Care manager, provider, and care team.   Interventions: Evaluation of current treatment plan related to  self management and patient's adherence to plan as established by provider  AFIB Interventions: (Status:  New goal.) Short Term Goal   Advised patient to discuss future resumption of ACT  with provider Assessed social determinant of health barriers Confirmed patient has stopped taking ACT (Xarelto) due to recent ICH, and understands to continue taking diltiazem as per hospital discharging provider instructions  Stroke:  (Status:New goal.) Short Term Goal Reviewed Importance of taking all medications as prescribed Reviewed Importance of attending all scheduled provider appointments Advised to report any changes in symptoms or exercise tolerance Assessed social determinant of health barriers Reviewed referrals to outpatient therapy Initiated education around  heart healthy low salt diet;  assessed reported ongoing visual changes/ confirmed no need for assistive devices  Patient Goals/Self-Care Activities: Participate in Transition of Care Program/Attend [facility] scheduled calls Notify RN Care Manager of TOC call rescheduling needs Take all medications as prescribed Attend all scheduled provider appointments Call provider office for new concerns or questions  Next week-- Call the outpatient rehabilitation facility to initiate scheduling of Joe's outpatient PT/ rehabilitation; call Dr. Marlis Edelson office to go ahead and schedule the appointment with him in 8 weeks  Follow Up Plan:  Telephone follow up appointment with care management team member scheduled for:  Thursday, April 13, 2023 at 3:00 pm         Plan: No further follow up-- Patient/Caregiver- spouse declined further follow-up/ participation in [facility]- 30 day program  Caryl Pina, RN, BSN, Media planner  Transitions of Care  [facility] - [facility] [phone]: direct office

## 2023-04-14 NOTE — Patient Outreach (Signed)
  Care Management  Transitions of Care Program Transitions of Care Post-discharge week 2- Unsuccessful Attempt # 2  04/14/2023 Name: Mark Cohen MRN: [id number] DOB: Jul 03, 1951  Subjective: Mark Cohen is a 72 y.o. year old male who is a primary care patient of Etta Grandchild, MD. The Care Management team was unable to reach the patient by phone to assess and address transitions of care needs.   Plan: Additional outreach attempts will be made to reach the patient enrolled in the [facility] Program (Post Inpatient/ED Visit).  Caryl Pina, RN, BSN, Media planner  Transitions of Care  [facility] - [facility] [phone]: direct office

## 2023-04-17 ENCOUNTER — Encounter: Payer: Self-pay | Admitting: Internal Medicine

## 2023-04-17 ENCOUNTER — Ambulatory Visit: Payer: Medicare HMO | Admitting: Internal Medicine

## 2023-04-17 VITALS — BP 138/86 | HR 95 | Temp 97.9°F | Ht 72.0 in | Wt 160.0 lb

## 2023-04-17 DIAGNOSIS — I611 Nontraumatic intracerebral hemorrhage in hemisphere, cortical: Secondary | ICD-10-CM

## 2023-04-17 DIAGNOSIS — I10 Essential (primary) hypertension: Secondary | ICD-10-CM | POA: Diagnosis not present

## 2023-04-17 DIAGNOSIS — I480 Paroxysmal atrial fibrillation: Secondary | ICD-10-CM | POA: Diagnosis not present

## 2023-04-17 NOTE — Therapy (Signed)
OUTPATIENT SPEECH LANGUAGE PATHOLOGY EVALUATION   Patient Name: Mark Cohen MRN: [id number] DOB:Jan 30, 1951, 72 y.o., male Today's Date: 04/18/2023  PCP: Sanda Linger, Elbert Ewings, MD REFERRING PROVIDER: Lina Sayre, MD  END OF SESSION:  End of Session - 04/18/23 1356     Visit Number 1    Number of Visits 17    Date for SLP Re-Evaluation 06/17/23    SLP Start Time 1152    SLP Stop Time  1235    SLP Time Calculation (min) 43 min    Activity Tolerance Patient tolerated treatment well             Past Medical History:  Diagnosis Date   Allergic rhinitis    Atrial fibrillation (HCC)    Colon polyps    Epididymal cyst    right   GERD (gastroesophageal reflux disease)    Hepatitis 1961   hepatitis a as child, no liver oproblems since   Hip pain    both   Past Surgical History:  Procedure Laterality Date   APPENDECTOMY     colonscopy  07/27/2016   EPIDIDYMECTOMY  08/29/2002   Sees Dr Patsi Sears twice a year left side   EPIDIDYMECTOMY Right 09/09/2016   Procedure: EPIDIDYMECTOMY;  Surgeon: Jethro Bolus, MD;  Location: [facility];  Service: Urology;  Laterality: Right;   IR IVC FILTER PLMT / S&I /IMG GUID/MOD SED  04/04/2023   IR IVC FILTER RETRIEVAL / S&I /IMG GUID/MOD SED  04/04/2023   NASAL SINUS SURGERY  2008   Patient Active Problem List   Diagnosis Date Noted   ICH (intracerebral hemorrhage) (HCC) 04/03/2023   D-dimer, elevated 03/31/2023   Acute pulmonary embolism (HCC) 03/31/2023   Paroxysmal atrial fibrillation (HCC) 03/31/2023   Elevated troponin 03/31/2023   Dry mouth 03/29/2023   Atrial fibrillation with RVR (HCC) 03/29/2023   Elevated brain natriuretic peptide (BNP) level 03/29/2023   Sacro-iliac pain 02/03/2023   Chronic bilateral low back pain without sciatica 04/07/2022   Chronic pain of both hips 04/07/2022   Gastroesophageal reflux disease without esophagitis 10/12/2021   Polyp of colon 10/12/2021   Plantar fasciitis 04/18/2019   Benign prostatic  hyperplasia without lower urinary tract symptoms 04/24/2018   Carpal tunnel syndrome on both sides 06/22/2017   Hypogonadism male 02/09/2017   Hyperlipidemia with target LDL less than 130 01/14/2016   GAD (generalized anxiety disorder) 01/21/2015   DJD (degenerative joint disease), multiple sites 05/10/2013   Left lumbar radiculitis 08/12/2011   Essential hypertension, benign 08/12/2011   Routine general medical examination at a health care facility 04/28/2011   ALLERGIC RHINITIS 01/29/2010    ONSET DATE: 04/03/23   REFERRING DIAG: I61.9 (ICD-10-CM) - Nontraumatic intracerebral hemorrhage, unspecified cerebral location, unspecified laterality (HCC)  THERAPY DIAG:  Cognitive communication deficit  Rationale for Evaluation and Treatment: Rehabilitation  SUBJECTIVE:   SUBJECTIVE STATEMENT: "I've always been a 90 MPH guy, I need to slow down now."  Pt accompanied by: significant other  PERTINENT HISTORY:  history of atrial fibrillation, recent PE and 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 (04/06/23) DISCHARGE PRIMARY DIAGNOSIS:  Intracerebral Hemorrhage:  left parieto-occipital convexity ICH with surrounding vasogenic edema without significant mass effect or midline shift following Xarelto initiation 2 days PTA. S/p Andexxa administration for reversal.  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 No  FALLS: Has patient fallen in last 6 months?  No  LIVING ENVIRONMENT: Lives with: lives with their spouse Lives in: House/apartment  PLOF:  Level of assistance: Independent with ADLs, Independent with IADLs Employment: Retired  PATIENT GOALS: Return as close to baseline as possible  OBJECTIVE:  Note: Objective measures were completed at Evaluation unless otherwise noted.  DIAGNOSTIC FINDINGS:  SLE 04/05/23:  Clinical Impression Pt reports living at  home with his wife and completing all ADLs independently. He presents with a moderate cognitive impairment evidenced by his performance on the SLUMS. He scored 12/30 (a score of 27 or above is considered WFL) characterized by deficits related to memory, attention, problem solving, and planning. He could not recall any of the five objects after a delay. He was only able to name seven animals in a one minute divergent naming task with four repetitions. During the clock task, pt with some deficits related to planning. He was also unable to draw the hands of the clock, instead repeatedly writing "10:45" when prompted to set the time to "ten minutes until 11" and given Mod verbal cueing. He appeared to have difficulty locating the shapes and required increased support. Overall, feel that pt is experiencing acute changes related to cognition and may benefit from ongoing SLP f/u on an OP basis to facilitate return to PLOF. Will continue to follow acutely.  COGNITION: Overall cognitive status: Impaired Areas of impairment:  Executive function: Impaired: Organization, Planning, Error awareness, Self-correction, and Slow processing Functional deficits: Pt reports doing medication box refills with his wife to double-check he is accurate. Reportedly he paid credit card bills this month without errors.    COGNITIVE COMMUNICATION: Following directions: Follows multi-step commands with increased time , or occasionally with repeats ("How do you want me to give the time? Do you want me just to write it?") Auditory comprehension: Impaired: likely due to slowed processing Verbal expression: WFL Functional communication: WFL  ORAL MOTOR EXAMINATION: Overall status: WFL  STANDARDIZED ASSESSMENTS: CLQT: Initiated today, completed next session  PATIENT REPORTED OUTCOME MEASURES (PROM): Cognitive Function: to be provided first therapy session.   TODAY'S TREATMENT:                                                                                                                                          DATE:  04/18/23 (eval): n/a   PATIENT EDUCATION: Education details: Results of subtests, role of SLP in cognitive communication therapy, SLP prefers therapy frequency/duration at 1-2 times per week x8 weeks Person educated: Patient and Spouse Education method: Explanation and Demonstration Education comprehension: verbalized understanding   GOALS: Goals reviewed with patient? No  SHORT TERM GOALS: Target date: 05/19/23  Pt will complete cognitive communication assessment and Cognition PROM in first therapy session; Goals added PRN Baseline: Goal status: INITIAL  2.  Mark Cohen will demo appropriate planning and organization in mod complex cognitive communication tasks in 3 sessions Baseline:  Goal status: INITIAL  3.  Pt will demo 95% error awareness after double checking functional detailed written and/or verbal tasks in 3 sessions Baseline:  Goal status: INITIAL  4.  Pt will demo strategies for error awareness in mod complex cognitive communication (cog-comm) tasks independently in 3 sessions Baseline:  Goal status: INITIAL  5.  Pt will demo strategies for planning and organization with rare min A independently in 3 sessions Baseline:  Goal status: INITIAL   LONG TERM GOALS: Target date: 06/17/23  Pt will improve PROM from initial administration Baseline:  Goal status: INITIAL  2.  Pt will demo strategies for planning and organization independently in mod complex/complex cog-comm tasks in 3 sessions Baseline:  Goal status: INITIAL  3.  Pt will demo strategies for error awareness in mod complex/complex cog-comm tasks independently in 3 sessions Baseline:  Goal status: INITIAL  4.  Mark Cohen will demo more WNL processing time with /mod complex novel cog-comm tasks in 3 sessions.  Baseline:  Goal status: INITIAL  ASSESSMENT:  CLINICAL IMPRESSION: "Mark Cohen" is a 72 y.o. M who was seen today for  assessment of cognitive communication after an ICH suffered 04/03/23. Mark Cohen and Mark Cohen (Mark Cohen's wife) stated Mark Cohen paid his credit card bills without problem, and filled his med box without a problem but with Mark Cohen sitting beside him to verify accuracy. While an inpatient he scored 12/30 with SLUMS. Wife stated she thought the SLP performing the assessment moved too rapidly through the assessment and this affected pt's performance. 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 SLP wonders how much of a role these vision deficits played in the subtests today. SLP to perform more verbal subtests next session. SLP suggested x1-2/week however SLP thought scheduling pt once/week and getting authorization for x2/week might be helpful to incr frequency if pt's deficts are more neuro than vision-based. Today in clock drawing, and symbol cancel subtests of CLQT, it appeared pt's error awareness, processing time, and planning and organization were deficit areas however pt's vision could be depressing pt's overall performance. SLP to write goals as if vision is minimally impacting pt performance, and pt were scheduled x2/week in case this occurs.  OBJECTIVE IMPAIRMENTS: include awareness and executive functioning  . These impairments are limiting patient from household responsibilities, ADLs/IADLs, and effectively communicating at home and in community. Factors affecting potential to achieve goals and functional outcome are  awareness of deficits(?) .Marland Kitchen Patient will benefit from skilled SLP services to address above impairments and improve overall function.  REHAB POTENTIAL: Good  PLAN:  SLP FREQUENCY: 2x/week  SLP DURATION: 8 weeks  PLANNED INTERVENTIONS: Cueing hierachy, Cognitive reorganization, Internal/external aids, Functional tasks, SLP instruction and feedback, Compensatory strategies, and Patient/family education    [facility], CCC-SLP 04/18/2023, 3:08 PM

## 2023-04-17 NOTE — Progress Notes (Unsigned)
Subjective:  Patient ID: Alfredia Client, male    DOB: 1950-08-24  Age: 72 y.o. MRN: [id number]  CC: Hypertension   HPI GEROD CALIGIURI presents for f/up ----  Discussed the use of AI scribe software for clinical note transcription with the patient, who gave verbal consent to proceed.  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 He underwent a procedure to place a filter to prevent further emboli, which was initially unsuccessful and required a second attempt. The patient reported no residual symptoms such as headache or blurred vision, but noted a slight visual field deficit in the right eye, which is only noticeable when not turning the head. He denied any numbness, weakness, or tingling, and reported no issues with speech. Walking is performed cautiously, often with hands outstretched to prevent collisions, and always with assistance for safety.  The patient reported no current use of blood thinners and denied any significant bleeding or bruising, with the exception of a small bruise on the arm. He continues to take blood pressure medication, vitamin D, a stomach pill, and Xanax and pain medication as needed. He denied any irregular heartbeats or racing heart sensations.       Outpatient Medications Prior to Visit  Medication Sig Dispense Refill   ALPRAZolam (XANAX) 0.25 MG tablet Take 1 tablet (0.25 mg total) by mouth 2 (two) times daily as needed. (Patient taking differently: Take 0.25 mg by mouth 2 (two) times daily as needed for anxiety.) 60 tablet 3   Cholecalciferol (DIALYVITE VITAMIN D 5000 PO) Take 1 tablet by mouth daily.     diltiazem (CARDIZEM CD) 120 MG 24 hr capsule Take 1 capsule (120 mg total) by mouth daily. 90 capsule 1   esomeprazole (NEXIUM) 40 MG capsule TAKE 1 CAPSULE BY MOUTH DAILY 90 capsule 1   HYDROcodone-acetaminophen  (NORCO) 10-325 MG tablet Take 1 tablet by mouth every 8 (eight) hours. (Patient taking differently: Take 1 tablet by mouth every 6 (six) hours as needed for moderate pain.) 90 tablet 0   No facility-administered medications prior to visit.    ROS Review of Systems  Objective:  BP 138/86 (BP Location: Right Arm, Patient Position: Sitting, Cuff Size: Large)   Pulse 95   Temp 97.9 F (36.6 C) (Oral)   Ht 6' (1.829 m)   Wt 160 lb (72.6 kg)   SpO2 96%   BMI 21.70 kg/m   BP Readings from Last 3 Encounters:  04/17/23 138/86  04/06/23 (!) 144/99  04/01/23 137/76    Wt Readings from Last 3 Encounters:  04/17/23 160 lb (72.6 kg)  04/03/23 165 lb 9.1 oz (75.1 kg)  04/01/23 162 lb 11.2 oz (73.8 kg)    Physical Exam  Lab Results  Component Value Date   WBC 7.4 04/06/2023   HGB 14.1 04/06/2023   HCT 40.3 04/06/2023   PLT 272 04/06/2023   GLUCOSE 88 04/06/2023   CHOL 166 02/03/2023   TRIG 47.0 02/03/2023   HDL 88.10 02/03/2023   LDLCALC 68 02/03/2023   ALT 16 04/03/2023   AST 26 04/03/2023   NA 135 04/06/2023   K 4.1 04/06/2023   CL 100 04/06/2023   CREATININE 0.87 04/06/2023   BUN 10 04/06/2023   CO2 25 04/06/2023   TSH 0.65 02/03/2023   PSA 0.11 02/03/2023   INR 2.0 (H) 04/03/2023   HGBA1C  5.7 (H) 04/03/2023    IR IVC FILTER PLMT / S&I Lenise Arena GUID/MOD SED  Result Date: 04/05/2023 INDICATION: DVT and pulmonary embolism with contraindication to anticoagulation (left parieto-occipital hematoma). Please perform IVC filter placement for caval interruption purposes. EXAM: 1. IR ULTRASOUND GUIDANCE VASC ACCESS 2. IR IVC FILTER RETRIEVAL/S+I/ IMAGE GUIDE MODERATE SEDATION 3. IR IVC FILTER PLACEMENT COMPARISON:  Chest CT-03/31/2023 MEDICATIONS: None. ANESTHESIA/SEDATION: Moderate (conscious) sedation was employed during this procedure. A total of Versed 2.5 mg was administered intravenously. Moderate Sedation Time: 58 minutes. The patient's level of consciousness and vital signs  were monitored continuously by radiology nursing throughout the procedure under my direct supervision. CONTRAST:  75 cc Omnipaque 300 COMPLICATIONS: None immediate TECHNIQUE: Informed written consent was obtained from the patient after a discussion of the risks, benefits and alternatives to treatment. Questions regarding the procedure were encouraged and answered. A timeout was performed prior to the initiation of the procedure. The right neck was prepped and draped in the usual sterile fashion, and a sterile drape was applied covering the operative field. Maximum barrier sterile technique with sterile gowns and gloves were used for the procedure. A timeout was performed prior to the initiation of the procedure. Local anesthesia was provided with 1% lidocaine. Under direct ultrasound guidance, the right internal jugular vein was accessed with a micropuncture needle at the overlying soft tissues were anesthetized with 1% lidocaine. An ultrasound image was saved for documentation purposes. Over a guidewire, the IVC filter delivery sheath and inner dilator were advanced into the IVC just above the IVC bifurcation. Contrast injection was performed for an IVC venogram. Through the delivery sheath, a retrievable Denali IVC filter was deployed below the level of the renal veins and above the IVC bifurcation. Limited post deployment venacavagram was performed. Despite appropriate deployment of the IVC filter below the level of the renal veins, note was made of crossing of the primary struts of the IVC filter. Attempts were made to expand/re-deploy the primary struts however ultimately this was aborted and the decision was made to remove the IVC filter. As such, delivery sheath was exchanged for an 11 [place] Cook IVC Counsellor. Prolonged efforts were made to snare the hook of the IVC filter however this was quite challenging due to angulation of the IVC filter. As such, over a stiff glidewire, the 11 French  sheath was exchanged for a 10 French angled sheath which was directed towards the hook of the filter. Ultimately, the hook of the filter was successfully snared and the filter was withdrawn intact into the co-axial 10-French sheath. 1 Retrieval sheath was exchanged for a delivery sheath and an inferior vena cavagram was performed. Through the delivery sheath, a retrievable Denali IVC filter was deployed below the level of the renal veins and above the IVC bifurcation. Limited post deployment venacavagram was performed. A completion inferior venacavagram was performed. At this point, the procedure was terminated. All wires, catheters and sheaths were removed from the patient. Hemostasis was achieved at the right neck access site with manual compression. A dressing was applied. The patient tolerated the above procedure well without immediate postprocedural complication. FINDINGS: Inferior venacavagram demonstrates wide patency of the IVC. No evidence of thrombus, stenosis, or occlusion. No variant venous anatomy. While initial deployment of the IVC filter demonstrated successful placement of the filter below the level of the renal veins, the primary struts of the filter were noted to be incompletely deployed. Attempts were made to re-expand the primary struts however ultimately the  decision was made to remove the mild deployed IVC filter. With some difficulty, the IVC filter was ultimately successfully removed using a snare device as detailed above. Next, a new IVC filter was successfully deployed below the level of the renal veins with appropriate apposition of the primary and secondary struts. Completion inferior venacavagram was negative for caval injury. IMPRESSION: 1. Normal inferior vena cavagram 2. Successful fluoroscopic guided placement and removal of the initial mal-deployed IVC filter. 3. Successful fluoroscopic guided placement and deployment of an appropriately positioned and deployed infrarenal IVC  filter. Electronically Signed   By: Simonne Come M.D.   On: 04/05/2023 11:31   IR IVC Filter Retrieval / S&I /Img Guid/Mod Sed  Result Date: 04/05/2023 INDICATION: DVT and pulmonary embolism with contraindication to anticoagulation (left parieto-occipital hematoma). Please perform IVC filter placement for caval interruption purposes. EXAM: 1. IR ULTRASOUND GUIDANCE VASC ACCESS 2. IR IVC FILTER RETRIEVAL/S+I/ IMAGE GUIDE MODERATE SEDATION 3. IR IVC FILTER PLACEMENT COMPARISON:  Chest CT-03/31/2023 MEDICATIONS: None. ANESTHESIA/SEDATION: Moderate (conscious) sedation was employed during this procedure. A total of Versed 2.5 mg was administered intravenously. Moderate Sedation Time: 58 minutes. The patient's level of consciousness and vital signs were monitored continuously by radiology nursing throughout the procedure under my direct supervision. CONTRAST:  75 cc Omnipaque 300 COMPLICATIONS: None immediate TECHNIQUE: Informed written consent was obtained from the patient after a discussion of the risks, benefits and alternatives to treatment. Questions regarding the procedure were encouraged and answered. A timeout was performed prior to the initiation of the procedure. The right neck was prepped and draped in the usual sterile fashion, and a sterile drape was applied covering the operative field. Maximum barrier sterile technique with sterile gowns and gloves were used for the procedure. A timeout was performed prior to the initiation of the procedure. Local anesthesia was provided with 1% lidocaine. Under direct ultrasound guidance, the right internal jugular vein was accessed with a micropuncture needle at the overlying soft tissues were anesthetized with 1% lidocaine. An ultrasound image was saved for documentation purposes. Over a guidewire, the IVC filter delivery sheath and inner dilator were advanced into the IVC just above the IVC bifurcation. Contrast injection was performed for an IVC venogram. Through  the delivery sheath, a retrievable Denali IVC filter was deployed below the level of the renal veins and above the IVC bifurcation. Limited post deployment venacavagram was performed. Despite appropriate deployment of the IVC filter below the level of the renal veins, note was made of crossing of the primary struts of the IVC filter. Attempts were made to expand/re-deploy the primary struts however ultimately this was aborted and the decision was made to remove the IVC filter. As such, delivery sheath was exchanged for an 11 [place] Cook IVC Counsellor. Prolonged efforts were made to snare the hook of the IVC filter however this was quite challenging due to angulation of the IVC filter. As such, over a stiff glidewire, the 11 French sheath was exchanged for a 10 French angled sheath which was directed towards the hook of the filter. Ultimately, the hook of the filter was successfully snared and the filter was withdrawn intact into the co-axial 10-French sheath. 1 Retrieval sheath was exchanged for a delivery sheath and an inferior vena cavagram was performed. Through the delivery sheath, a retrievable Denali IVC filter was deployed below the level of the renal veins and above the IVC bifurcation. Limited post deployment venacavagram was performed. A completion inferior venacavagram was performed. At this point,  the procedure was terminated. All wires, catheters and sheaths were removed from the patient. Hemostasis was achieved at the right neck access site with manual compression. A dressing was applied. The patient tolerated the above procedure well without immediate postprocedural complication. FINDINGS: Inferior venacavagram demonstrates wide patency of the IVC. No evidence of thrombus, stenosis, or occlusion. No variant venous anatomy. While initial deployment of the IVC filter demonstrated successful placement of the filter below the level of the renal veins, the primary struts of the filter were  noted to be incompletely deployed. Attempts were made to re-expand the primary struts however ultimately the decision was made to remove the mild deployed IVC filter. With some difficulty, the IVC filter was ultimately successfully removed using a snare device as detailed above. Next, a new IVC filter was successfully deployed below the level of the renal veins with appropriate apposition of the primary and secondary struts. Completion inferior venacavagram was negative for caval injury. IMPRESSION: 1. Normal inferior vena cavagram 2. Successful fluoroscopic guided placement and removal of the initial mal-deployed IVC filter. 3. Successful fluoroscopic guided placement and deployment of an appropriately positioned and deployed infrarenal IVC filter. Electronically Signed   By: Simonne Come M.D.   On: 04/05/2023 11:31   MR BRAIN W WO CONTRAST  Result Date: 04/04/2023 CLINICAL DATA:  Headache and confusion. Hypertension. Trouble sing at of the right eye. 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 EXAM: MRI HEAD WITHOUT AND WITH CONTRAST TECHNIQUE: Multiplanar, multiecho pulse sequences of the brain and surrounding structures were obtained without and with intravenous contrast. CONTRAST:  7.57mL GADAVIST GADOBUTROL 1 MMOL/ML IV SOLN COMPARISON:  Head CT from earlier today FINDINGS: Brain: Known acute hematoma in the left parietal and occipital region which extends to the brain surface. There are numerous chronic microhemorrhages scattered along the cerebral convexities, although more asymmetric to the left cerebral hemisphere. 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 Mild for age chronic small vessel ischemic type change in the cerebral white matter. The rim of moderate vasogenic edema surrounds the hematoma. No underlying infarct is detected. No evidence of mass lesion. Vascular: Major flow voids and vascular enhancements are preserved Skull and upper cervical  spine: Normal marrow signal Sinuses/Orbits: Negative IMPRESSION: No infarct or mass seen underlying the patient's left parieto-occipital hematoma. There are chronic superficial micro-hemorrhages and mild superficial siderosis suggesting amyloid angiopathy. Electronically Signed   By: Tiburcio Pea M.D.   On: 04/04/2023 10:39   CT HEAD WO CONTRAST ( )  Result Date: 04/04/2023 CLINICAL DATA:  Stroke follow-up EXAM: CT HEAD WITHOUT CONTRAST TECHNIQUE: Contiguous axial images were obtained from the base of the skull through the vertex without intravenous contrast. RADIATION DOSE REDUCTION: This exam was performed according to the departmental dose-optimization program which includes automated exposure control, adjustment of the mA and/or kV according to patient size and/or use of iterative reconstruction technique. COMPARISON:  Head CT from yesterday FINDINGS: Brain: More homogeneously dense and slightly larger hematoma in the left parietooccipital region which measures 5 cm craniocaudal as compared to 4.4 cm yesterday. Adjacent edema and local mass effect is similar. No new area of bleeding. No hydrocephalus. Mild chronic white matter disease. Vascular: No hyperdense vessel or unexpected calcification. Skull: Normal. Negative for fracture or focal lesion. Sinuses/Orbits: No acute finding. IMPRESSION: More homogeneously dense and slightly fuller left cerebral ICH but similar degree of local mass effect. Electronically Signed   By: Tiburcio Pea M.D.   On: 04/04/2023 06:15  CT ANGIO HEAD NECK W WO CM (CODE STROKE)  Result Date: 04/03/2023 CLINICAL DATA:  Neuro deficit, acute, stroke suspected. Intracranial hemorrhage. EXAM: CT ANGIOGRAPHY HEAD AND NECK WITH AND WITHOUT CONTRAST TECHNIQUE: Multidetector CT imaging of the head and neck was performed using the standard protocol during bolus administration of intravenous contrast. Multiplanar CT image reconstructions and MIPs were obtained to evaluate the  vascular anatomy. Carotid stenosis measurements (when applicable) are obtained utilizing NASCET criteria, using the distal internal carotid diameter as the denominator. RADIATION DOSE REDUCTION: This exam was performed according to the departmental dose-optimization program which includes automated exposure control, adjustment of the mA and/or kV according to patient size and/or use of iterative reconstruction technique. CONTRAST:  75mL OMNIPAQUE IOHEXOL 350 MG/ML SOLN COMPARISON:  Head CT 04/03/2023. FINDINGS: CTA NECK FINDINGS Aortic arch: Incompletely imaged. Visualized portions the proximal arch vessels are patent. Right carotid system: No evidence of dissection, stenosis (50% or greater), or occlusion. Left carotid system: No evidence of dissection, stenosis (50% or greater), or occlusion. Vertebral arteries: Codominant. No evidence of dissection, stenosis (50% or greater), or occlusion. Skeleton: Mild cervical spondylosis without high-grade spinal canal stenosis. Other neck: Unremarkable. Upper chest: Unremarkable. Review of the MIP images confirms the above findings CTA HEAD FINDINGS Anterior circulation: Intracranial ICAs are patent without stenosis or aneurysm. The proximal ACAs and MCAs are patent without stenosis, aneurysm or vascular malformation. Distal branches are symmetric. Posterior circulation: Normal basilar artery. The SCAs, AICAs and PICAs are patent proximally. The PCAs are patent proximally without stenosis, aneurysm or vascular malformation. Distal branches are symmetric. Venous sinuses: As permitted by contrast timing, patent. Anatomic variants: Hypoplastic right A1 segment. Review of the MIP images confirms the above findings IMPRESSION: No large vessel occlusion, hemodynamically significant stenosis, aneurysm, or vascular malformation in the head or neck. Electronically Signed   By: Orvan Falconer M.D.   On: 04/03/2023 19:41   CT HEAD CODE STROKE WO CONTRAST  Result Date:  04/03/2023 CLINICAL DATA:  Code stroke. Initial evaluation for neuro deficit, stroke suspected. EXAM: CT HEAD WITHOUT CONTRAST TECHNIQUE: Contiguous axial images were obtained from the base of the skull through the vertex without intravenous contrast. RADIATION DOSE REDUCTION: This exam was performed according to the departmental dose-optimization program which includes automated exposure control, adjustment of the mA and/or kV according to patient size and/or use of iterative reconstruction technique. COMPARISON:  Prior study from 06/03/2005. FINDINGS: Brain: Acute intraparenchymal hematoma centered at the left parieto-occipital convexity measures 5.3 x 4.0 x 3.0 cm (estimated volume 32 mL). Surrounding vasogenic edema without significant regional mass effect or midline shift. No intraventricular or extra-axial extension. No other acute intracranial hemorrhage. No other large vessel territory infarct. Underlying mild chronic microvascular ischemic disease noted. No midline shift or hydrocephalus. Vascular: No abnormal hyperdense vessel. Skull: Scalp soft tissues and calvarium demonstrate no acute finding. Sinuses/Orbits: Globes and orbital soft tissues within normal limits. Scattered mucosal thickening present about the ethmoidal air cells. No mastoid effusion. Other: None. ASPECTS [facility] Stroke Program Early CT Score) Does not apply, acute ICH. IMPRESSION: 5.3 x 4.0 x 3.0 cm acute intraparenchymal hematoma centered at the left parieto-occipital convexity (estimated volume 32 mL). Surrounding vasogenic edema without significant regional mass effect or midline shift. These results were communicated to Dr. Derry Lory at 7:19 pm on 04/03/2023 by text page via the [facility] messaging system. Electronically Signed   By: Rise Mu M.D.   On: 04/03/2023 19:20    Assessment & Plan:  There are no diagnoses  linked to this encounter.   Follow-up: No follow-ups on file.  Sanda Linger, MD

## 2023-04-18 ENCOUNTER — Encounter: Payer: Self-pay | Admitting: Occupational Therapy

## 2023-04-18 ENCOUNTER — Other Ambulatory Visit: Payer: Self-pay

## 2023-04-18 ENCOUNTER — Ambulatory Visit: Payer: Medicare HMO | Attending: Neurology | Admitting: Occupational Therapy

## 2023-04-18 ENCOUNTER — Encounter: Payer: Self-pay | Admitting: Physical Therapy

## 2023-04-18 ENCOUNTER — Ambulatory Visit: Payer: Medicare HMO

## 2023-04-18 ENCOUNTER — Ambulatory Visit: Payer: Medicare HMO | Admitting: Physical Therapy

## 2023-04-18 DIAGNOSIS — R41841 Cognitive communication deficit: Secondary | ICD-10-CM | POA: Diagnosis not present

## 2023-04-18 DIAGNOSIS — R278 Other lack of coordination: Secondary | ICD-10-CM | POA: Diagnosis not present

## 2023-04-18 DIAGNOSIS — R2681 Unsteadiness on feet: Secondary | ICD-10-CM | POA: Diagnosis not present

## 2023-04-18 DIAGNOSIS — I619 Nontraumatic intracerebral hemorrhage, unspecified: Secondary | ICD-10-CM | POA: Insufficient documentation

## 2023-04-18 DIAGNOSIS — R2689 Other abnormalities of gait and mobility: Secondary | ICD-10-CM | POA: Insufficient documentation

## 2023-04-18 DIAGNOSIS — R41840 Attention and concentration deficit: Secondary | ICD-10-CM | POA: Diagnosis not present

## 2023-04-18 DIAGNOSIS — R41842 Visuospatial deficit: Secondary | ICD-10-CM | POA: Diagnosis not present

## 2023-04-18 NOTE — Therapy (Signed)
OUTPATIENT OCCUPATIONAL THERAPY NEURO EVALUATION  Patient Name: Mark Cohen MRN: [id number] DOB:[birth year], 72 y.o., male Today's Date: 04/18/2023  PCP: Etta Grandchild, MD  REFERRING PROVIDER: Micki Riley, MD  END OF SESSION:  OT End of Session - 04/18/23 1420     Visit Number 1    Number of Visits 16   anticipate 1x per week for 8 weeks (8 visits), may increase to 2x per week for 8 weeks (16 visits) PRN   Date for OT Re-Evaluation 06/13/23    Authorization Type [employer] 2024 - Auth not required    Progress Note Due on Visit 10    OT Start Time 1325    OT Stop Time 1415    OT Time Calculation (min) 50 min    Activity Tolerance Patient tolerated treatment well    Behavior During Therapy [facility] for tasks assessed/performed             Past Medical History:  Diagnosis Date   Allergic rhinitis    Atrial fibrillation (HCC)    Colon polyps    Epididymal cyst    right   GERD (gastroesophageal reflux disease)    Hepatitis 1961   hepatitis a as child, no liver oproblems since   Hip pain    both   Past Surgical History:  Procedure Laterality Date   APPENDECTOMY     colonscopy  07/27/2016   EPIDIDYMECTOMY  08/29/2002   Sees Dr Patsi Sears twice a year left side   EPIDIDYMECTOMY Right 09/09/2016   Procedure: EPIDIDYMECTOMY;  Surgeon: Jethro Bolus, MD;  Location: [facility];  Service: Urology;  Laterality: Right;   IR IVC FILTER PLMT / S&I /IMG GUID/MOD SED  04/04/2023   IR IVC FILTER RETRIEVAL / S&I /IMG GUID/MOD SED  04/04/2023   NASAL SINUS SURGERY  2008   Patient Active Problem List   Diagnosis Date Noted   ICH (intracerebral hemorrhage) (HCC) 04/03/2023   D-dimer, elevated 03/31/2023   Acute pulmonary embolism (HCC) 03/31/2023   Paroxysmal atrial fibrillation (HCC) 03/31/2023   Elevated troponin 03/31/2023   Dry mouth 03/29/2023   Atrial fibrillation with RVR (HCC) 03/29/2023   Elevated brain natriuretic peptide (BNP) level 03/29/2023   Sacro-iliac  pain 02/03/2023   Chronic bilateral low back pain without sciatica 04/07/2022   Chronic pain of both hips 04/07/2022   Gastroesophageal reflux disease without esophagitis 10/12/2021   Polyp of colon 10/12/2021   Plantar fasciitis 04/18/2019   Benign prostatic hyperplasia without lower urinary tract symptoms 04/24/2018   Carpal tunnel syndrome on both sides 06/22/2017   Hypogonadism male 02/09/2017   Hyperlipidemia with target LDL less than 130 01/14/2016   GAD (generalized anxiety disorder) 01/21/2015   DJD (degenerative joint disease), multiple sites 05/10/2013   Left lumbar radiculitis 08/12/2011   Essential hypertension, benign 08/12/2011   Routine general medical examination at a health care facility 04/28/2011   ALLERGIC RHINITIS 01/29/2010    ONSET DATE: 04/06/2023 (referral date), 04/03/23 (date of ICH)  REFERRING DIAG: I61.9 (ICD-10-CM) - Nontraumatic intracerebral hemorrhage, unspecified cerebral location, unspecified laterality (HCC)  THERAPY DIAG:  Other lack of coordination  Visuospatial deficit  Attention and concentration deficit  Rationale for Evaluation and Treatment: Rehabilitation  SUBJECTIVE:   SUBJECTIVE STATEMENT: Pt reports showering ind, completing light household cleaning tasks ind, and ind pouring coffee. Pt reports increased difficulty with making coffee and cooking simple meals since stroke. Pt's spouse reports pt is no longer driving since ICH and PE.  Pt accompanied by:  self and significant other  PERTINENT HISTORY: Per 04/06/23 d/c summary: left parieto-occipital ICH, AFIB, hx of recent pulmonary embolism and multiple DVTs, chronic low back pain without sciatica, GERD, plantar fascitis, hyperlipidemia, BUE carpal tunnel syndrome, GAD, HTN, apraxia and right hemianopsia, chronic R CN 6 palsy  04/06/23 - DISCHARGE PRIMARY DIAGNOSIS:  Intracerebral Hemorrhage:  left parieto-occipital convexity ICH with surrounding vasogenic edema without significant  mass effect or midline shift following Xarelto initiation 2 days PTA. S/p Andexxa administration for reversal. 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 No  FALLS: Has patient fallen in last 6 months? No  LIVING ENVIRONMENT: Lives with: lives with their spouse Lives in: House/apartment Stairs: Yes: External: 2 steps; none Has following equipment at home: Single point cane though currently not using cane for walking  PLOF: Independent with I/ADLs and driving  PATIENT GOALS: Pt reports "I want to return to cutting the grass and yard work and other outdoor activities and feel safe doing it." Pt's spouse reports hope for pt to be more ind with some daily tasks, such as grocery shopping.  OBJECTIVE:  Note: Objective measures were completed at Evaluation unless otherwise noted.  HAND DOMINANCE: Right  ADLs: Overall ADLs: ind for BADLs Transfers/ambulation related to ADLs: Eating: ind Grooming: ind UB Dressing: ind LB Dressing: ind Toileting: ind Bathing: ind Tub Shower transfers: ind (walk-in shower) Equipment: Walk in shower  IADLs: Shopping: dependent Light housekeeping: ind Meal Prep: dependent for stovetop and oven cooking tasks, pt prepares sandwiches ind Community mobility: dependent Medication management: with supervision and check-ins from spouse to verify Financial management: with supervision and assistance from spouse Handwriting: 100% legible and Increased time  04/18/23 - Pt copied 26/26 letters within a simple sentence. Pt's spouse reports pt writes with significantly decreased speed compared to PLOF. OT noted good spacing and legibility today though demo'd below-baseline alignment for 4 out of 9 words. Pt demonstrates some difficulty with fully completing letters (e.g. not crossing "A" and "x.")  Pt reported that 9-word sentence appeared to be "a lot" for writing.  MOBILITY STATUS: Independent  POSTURE COMMENTS:  No Significant postural limitations Sitting balance:  ind  ACTIVITY TOLERANCE: Activity tolerance: Good  FUNCTIONAL OUTCOME MEASURES: FOTO: Staff did not capture  UPPER EXTREMITY ROM:     BUE ROM noted to be [facility]  UPPER EXTREMITY MMT:     MMT Right eval Left eval  Shoulder flexion 5 5  Shoulder abduction 5 5  Shoulder adduction 5 5  Shoulder extension 5 5  Shoulder internal rotation    Shoulder external rotation    Middle trapezius    Lower trapezius    Elbow flexion 5 5  Elbow extension 5 5  Wrist flexion    Wrist extension    Wrist ulnar deviation    Wrist radial deviation    Wrist pronation    Wrist supination    (Blank rows = not tested)  HAND FUNCTION: Grip strength: Right: 67 lbs; Left: 67 lbs  COORDINATION: 9 Hole Peg test: Right: 37 sec; Left: 23 sec  OT noted pt turned his head in order to visually see pegs on R side, which may have increased time required for RUE.  SENSATION: Pt reported no sensation changes since ICH.  EDEMA: OT noted no edema on B hands.  MUSCLE TONE: Not tested. OT did not note any overt  signs and symptoms of muscle tone.  COGNITION: Overall cognitive status:  A&O: 4 out of 4 Pt recalled 3 out of 3 words after approx. 3 minutes.  Additional cognitive abilities assessed by SLP, please see SLP note. OT plans to further assess cognitive abilities during functional ADL/IADL tasks. OT noted pt sometimes required repeated verbal instructions.  VISION: Subjective report: Pt reports visual field cut on R side at "1 o'clock." Pt reports double vision when reading and therefore closes one eye to read information on a page. Pt reports decreased ability to locate walls when walking d/t poor depth perception and therefore uses outstretched arms to locate walls.  Baseline vision: Wears glasses all the time   Visual  history: Per 04/06/23 d/c summary: right hemianopsia, chronic R CN 6 palsy.  VISION ASSESSMENT: Impaired Visual Fields: Right visual field deficits Pt demonstrates no vision on R side until approx. 30 degrees from midline ("1 o'clock").  Patient has difficulty with following activities due to following visual impairments: Navigating environment, locating objects, persons, and obstacles on R side.  PERCEPTION: Not tested  PRAXIS: Not tested  OBSERVATIONS: Pt was accompanied by spouse today. Pt appeared well-kept. Pt ambulated ind with no LOB noted. Pt reports owning a cane though does not use cane for ambulation currently. Pt's spouse walked on pt's right side when exiting clinic today and pt's spouse confirmed that she intentionally walked on pt's right side. Pt reported being cautious with tasks. Per OT conversation with SLP, pt corrected initial error in visual scanning task with v/c and education. However, pt then continued to make similar errors throughout task despite education.   TODAY'S TREATMENT:                                                                                                                              DATE:    04/18/23 - OT provided education to pt and pt's spouse regarding OT role, POC, and visual scanning/compensation strategies for safety at home and in community. Pt and pt's spouse verbalized understanding. Handouts provided (see pt instructions).   PATIENT EDUCATION: Education details: see today's treatment above Person educated: Patient and Spouse Education method: Explanation, Demonstration, and Handouts Education comprehension: verbalized understanding, returned demonstration, and needs further education  HOME EXERCISE PROGRAM: 04/18/23 - Visual scanning and visual compensation strategies handouts (see pt instructions)   GOALS: Goals reviewed with patient? Yes  SHORT TERM GOALS: Target date: 05/16/23  Pt will demonstrate or verbalize compliance with  vision compensation strategies and vision HEP. Baseline: Goal status: INITIAL  Pt will be ind with coordination HEP. Baseline: Goal status: INITIAL  3. Pt will complete tabletop scanning activities with no v/c for vision compensation strategies and identifying errors. Baseline: Goal status: INITIAL   4.  Pt will report making a simple stovetop meal with no more than supervision assistance while attending to safety precautions. Baseline: Pt can make simple sandwich though not attempting stovetop or oven meals d/t  pt and pt's spouse concerns about safety. Goal status: INITIAL    LONG TERM GOALS: Target date: 06/13/23  Pt will complete 9-hole peg test in 32 seconds or less using visual compensation strategies PRN with dominant RUE. Baseline: Right: 37 sec Goal status: INITIAL  2.  Pt will copy a simple sentence with appropriate speed and less than 25% errors with baseline alignment using visual compensation strategies PRN. Baseline: Pt demonstrated 4 out of 9 below-line alignment when writing a simple sentence. Pt and pt's spouse report significantly decreased writing speed compared to PLOF. Goal status: INITIAL  3. Pt will navigate a moderately busy environment no more than 1 v/c to identify major obstacles in environment. Baseline: Pt's spouse reports pt is unable to locate items at grocery store currently. Goal status: INITIAL    ASSESSMENT:  CLINICAL IMPRESSION: Patient is a 72 y.o. male who was seen today for occupational therapy evaluation for left parieto-occipital ICH. Hx includes AFIB, recent pulmonary embolism and multiple DVTs, GERD, BUE carpal tunnel syndrome, apraxia and right hemianopsia, chronic R CN 6 palsy. Patient currently presents below baseline level of functioning demonstrating functional deficits and impairments as noted below.  Pt demonstrated BUE ROM and grip strength WFL though decreased RUE coordination. Pt demonstrated good understanding of visual  compensation strategies today though would benefit from continuing education and practice of using strategies to improve safety and improve participation in functional tasks. Pt would benefit from skilled OT services in the outpatient setting to work on impairments as noted below to help pt return to PLOF as able.     PERFORMANCE DEFICITS: in functional skills including IADLs, coordination, dexterity, Fine motor control, vision, and UE functional use, cognitive skills including learn, memory, problem solving, safety awareness, and sequencing, and psychosocial skills including coping strategies and environmental adaptation.   IMPAIRMENTS: are limiting patient from handwriting, IADLs, leisure, and social participation.   CO-MORBIDITIES: may have co-morbidities  that affects occupational performance. Patient will benefit from skilled OT to address above impairments and improve overall function.  MODIFICATION OR ASSISTANCE TO COMPLETE EVALUATION: Min-Moderate modification of tasks or assist with assess necessary to complete an evaluation.  OT OCCUPATIONAL PROFILE AND HISTORY: Problem focused assessment: Including review of records relating to presenting problem.  CLINICAL DECISION MAKING: LOW - limited treatment options, no task modification necessary  REHAB POTENTIAL: Good  EVALUATION COMPLEXITY: Low    PLAN:  OT FREQUENCY: 1-2x/week  OT DURATION: 8 weeks  PLANNED INTERVENTIONS: self care/ADL training, therapeutic exercise, therapeutic activity, neuromuscular re-education, manual therapy, manual lymph drainage, passive range of motion, functional mobility training, electrical stimulation, ultrasound, paraffin, fluidotherapy, moist heat, cryotherapy, patient/family education, cognitive remediation/compensation, visual/perceptual remediation/compensation, energy conservation, DME and/or AE instructions, and Re-evaluation  RECOMMENDED OTHER SERVICES: Pt also participated in PT and SLP  evaluations today  CONSULTED AND AGREED WITH PLAN OF CARE: Patient and pt's spouse  PLAN FOR NEXT SESSION:  Review visual compensation strategies PRN FM coordination tasks Initiate coordination HEP Practice writing tasks to improve FM coordination and dexterity Further assess cognitive abilities during functional ADL/IADL tasks.   Wynetta Emery, OT 04/18/2023, 4:20 PM

## 2023-04-18 NOTE — Therapy (Signed)
OUTPATIENT PHYSICAL THERAPY NEURO EVALUATION   Patient Name: Mark Cohen MRN: [id number] DOB:August 17, 1950, 72 y.o., male Today's Date: 04/18/2023   PCP: Etta Grandchild, MD REFERRING PROVIDER: Micki Riley, MD   END OF SESSION:  PT End of Session - 04/18/23 1247     Visit Number 1    Number of Visits 13    Date for PT Re-Evaluation 06/02/23    Authorization Type [employer] Medicare    PT Start Time 1241    PT Stop Time 1319    PT Time Calculation (min) 38 min    Activity Tolerance Patient tolerated treatment well    Behavior During Therapy [facility] for tasks assessed/performed             Past Medical History:  Diagnosis Date   Allergic rhinitis    Atrial fibrillation (HCC)    Colon polyps    Epididymal cyst    right   GERD (gastroesophageal reflux disease)    Hepatitis 1961   hepatitis a as child, no liver oproblems since   Hip pain    both   Past Surgical History:  Procedure Laterality Date   APPENDECTOMY     colonscopy  07/27/2016   EPIDIDYMECTOMY  08/29/2002   Sees Dr Patsi Sears twice a year left side   EPIDIDYMECTOMY Right 09/09/2016   Procedure: EPIDIDYMECTOMY;  Surgeon: Jethro Bolus, MD;  Location: [facility];  Service: Urology;  Laterality: Right;   IR IVC FILTER PLMT / S&I /IMG GUID/MOD SED  04/04/2023   IR IVC FILTER RETRIEVAL / S&I /IMG GUID/MOD SED  04/04/2023   NASAL SINUS SURGERY  2008   Patient Active Problem List   Diagnosis Date Noted   ICH (intracerebral hemorrhage) (HCC) 04/03/2023   D-dimer, elevated 03/31/2023   Acute pulmonary embolism (HCC) 03/31/2023   Paroxysmal atrial fibrillation (HCC) 03/31/2023   Elevated troponin 03/31/2023   Dry mouth 03/29/2023   Atrial fibrillation with RVR (HCC) 03/29/2023   Elevated brain natriuretic peptide (BNP) level 03/29/2023   Sacro-iliac pain 02/03/2023   Chronic bilateral low back pain without sciatica 04/07/2022   Chronic pain of both hips 04/07/2022   Gastroesophageal reflux disease without  esophagitis 10/12/2021   Polyp of colon 10/12/2021   Plantar fasciitis 04/18/2019   Benign prostatic hyperplasia without lower urinary tract symptoms 04/24/2018   Carpal tunnel syndrome on both sides 06/22/2017   Hypogonadism male 02/09/2017   Hyperlipidemia with target LDL less than 130 01/14/2016   GAD (generalized anxiety disorder) 01/21/2015   DJD (degenerative joint disease), multiple sites 05/10/2013   Left lumbar radiculitis 08/12/2011   Essential hypertension, benign 08/12/2011   Routine general medical examination at a health care facility 04/28/2011   ALLERGIC RHINITIS 01/29/2010    ONSET DATE: 04/06/2023  REFERRING DIAG: I61.9 (ICD-10-CM) - Nontraumatic intracerebral hemorrhage, unspecified cerebral location, unspecified laterality (HCC)   THERAPY DIAG:  Unsteadiness on feet  Other abnormalities of gait and mobility  Rationale for Evaluation and Treatment: Rehabilitation  SUBJECTIVE:  SUBJECTIVE STATEMENT: Cautious with walking around the house.  Able to shower and toilet on my own.  Prior to hospitalization, had changes in medications, with addition of Xarelto and then had the brain bleed several days later.  Noticing some strength and balance changes, which is improving.  Was independent PTA. Pt accompanied by: significant other  PERTINENT HISTORY: Hospitalized 9/23-9/26/2024: Intracerebral Hemorrhage: left parieto-occipital convexity ICH with surrounding vasogenic edema without significant mass effect or midline shift; IVC filter placed; PMH of A-fib and PE, HTN, HLD, chronic R cranial nerve 6 palsy, LBP  PAIN:  Are you having pain? No  PRECAUTIONS: Fall and Other: no peripheral vision R side    No driving  RED FLAGS: None   WEIGHT BEARING RESTRICTIONS: No  FALLS: Has patient  fallen in last 6 months? No  LIVING ENVIRONMENT: Lives with: lives with their spouse Lives in: House/apartment-one level Stairs: Yes: External: 2 steps; none Has following equipment at home: Single point cane  PLOF: Independent  PATIENT GOALS: Pt's goals for therapy are to get back to independent walking and to drive again  OBJECTIVE:  Note: Objective measures were completed at Evaluation unless otherwise noted.  DIAGNOSTIC FINDINGS: See above  COGNITION: Overall cognitive status: Within functional limits for tasks assessed  Tearful at times; he answers questions differently than wife (as far as his movement abilities) SENSATION: Light touch: WFL  COORDINATION: WFL   MUSCLE TONE: WFL BLEs   POSTURE: rounded shoulders and forward head  LOWER EXTREMITY ROM:   WFL BLEs  Active  Right Eval Left Eval  Hip flexion    Hip extension    Hip abduction    Hip adduction    Hip internal rotation    Hip external rotation    Knee flexion    Knee extension    Ankle dorsiflexion    Ankle plantarflexion    Ankle inversion    Ankle eversion     (Blank rows = not tested)  LOWER EXTREMITY MMT:  BLE 5/5 throughout  MMT Right Eval Left Eval  Hip flexion    Hip extension    Hip abduction    Hip adduction    Hip internal rotation    Hip external rotation    Knee flexion    Knee extension    Ankle dorsiflexion    Ankle plantarflexion    Ankle inversion    Ankle eversion    (Blank rows = not tested)   TRANSFERS: Assistive device utilized: None  Sit to stand: Modified independence Stand to sit: Modified independence  STAIRS: Level of Assistance: SBA Stair Negotiation Technique: Alternating Pattern  with Single Rail on Left Number of Stairs: 2-3  Height of Stairs: 4-6"   GAIT: Gait pattern:  holds arms in high guard in front of him due to decreased R peripheral vision, step through pattern, decreased step length- Right, and decreased step length- Left Distance  walked: 50 ft Assistive device utilized: None Level of assistance: CGA Comments: slowed pace, turns head to look at R peripheral visual field or relies on PT cues  FUNCTIONAL TESTS:  5 times sit to stand: 8.63 sec Timed up and go (TUG): 16.68 sec 10 meter walk test: NT Dynamic Gait Index: 11/24 see below  M-CTSIB  Condition 1: Firm Surface, EO 30 Sec, Normal Sway  Condition 2: Firm Surface, EC 30 Sec, Mild Sway  Condition 3: Foam Surface, EO 30 Sec, Mild Sway  Condition 4: Foam Surface, EC 30 Sec, Moderate Sway  [facility] PT Assessment - 04/18/23 0001       Standardized Balance Assessment   Standardized Balance Assessment Dynamic Gait Index      Dynamic Gait Index   Level Surface Moderate Impairment    Change in Gait Speed Mild Impairment    Gait with Horizontal Head Turns Moderate Impairment    Gait with Vertical Head Turns Moderate Impairment    Gait and Pivot Turn Mild Impairment    Step Over Obstacle Moderate Impairment    Step Around Obstacles Moderate Impairment    Steps Mild Impairment    Total Score 11    DGI comment: Scores <19/24 indicate increased fall risk             PATIENT SURVEYS:  FOTO NP-staff did not capture  TODAY'S TREATMENT:                                                                                                                              DATE: 04/18/2023    PATIENT EDUCATION: Education details: Eval results, POC Person educated: Patient and Spouse Education method: Explanation Education comprehension: verbalized understanding  HOME EXERCISE PROGRAM: Not yet initiated  GOALS: Goals reviewed with patient? Yes  SHORT TERM GOALS: Target date: 05/19/2023  Pt will be independent with HEP for improved balance, gait. Baseline: Goal status: INITIAL  2.  Pt will improve Condition 4 on MCTSIB to mild sway for improved balance. Baseline:  Goal status: INITIAL  3.  Pt will improve TUG score to less than or equal to 13 sec for  decreased fall risk. Baseline: 16.68 sec Goal status: INITIAL   LONG TERM GOALS: Target date: 06/02/2023  Pt will be independent with HEP for improved balance and gait. Baseline:  Goal status: INITIAL  2.  Pt will improve DGI score to at least 19/24 to decrease fall risk. Baseline: 11/24 Goal status: INITIAL  3.  Gait velocity to be assessed and goal to be written as appropriate. Baseline:  Goal status: INITIAL   ASSESSMENT:  CLINICAL IMPRESSION: Patient is a 72 y.o. male who was seen today for physical therapy evaluation and treatment for L ICH.  He was hospitalized from 04/03/23-04/06/23 due to L ICH.  Pt/wife reports one big change has been decreased R peripheral vision, which is affected balance and gait.  Prior to hospitalization, he was independent without limitations.  He presents today with decreased balance, decreased independence with gait.  He does tend to veer to L side, has slowed/guarded gait pattern; he turns to look to R side about 50% of the time in busy clinic environment.  He is at fall risk per DGI and TUG scores.  He will benefit from skilled PT to address the above stated deficits to improve functional mobility and return to independent PLOF.  OBJECTIVE IMPAIRMENTS: Abnormal gait, decreased balance, decreased mobility, difficulty walking, and impaired vision/preception.   ACTIVITY LIMITATIONS: carrying, stairs, and locomotion level  PARTICIPATION LIMITATIONS: meal prep, cleaning,  laundry, driving, shopping, and community activity  PERSONAL FACTORS: 3+ comorbidities: See above for PMH  are also affecting patient's functional outcome.   REHAB POTENTIAL: Good  CLINICAL DECISION MAKING: Evolving/moderate complexity  EVALUATION COMPLEXITY: Moderate  PLAN:  PT FREQUENCY: 1-2x/week  PT DURATION: 6 weeks plus eval  PLANNED INTERVENTIONS: Therapeutic exercises, Therapeutic activity, Neuromuscular re-education, Balance training, Gait training, Patient/Family  education, Self Care, Stair training, and DME instructions  PLAN FOR NEXT SESSION: Initiate HEP for standing balance-counter and corner balance; assess and write goal for gait velocity; compensations for loss of R peripheral vision (coordinate with OT)   Gean Maidens., PT 04/18/2023, 3:21 PM  [facility] [address] [place], [place] [address] Phone # [phone] Fax # [phone]

## 2023-04-24 ENCOUNTER — Telehealth: Payer: Self-pay | Admitting: Internal Medicine

## 2023-04-24 NOTE — Telephone Encounter (Signed)
Pt has been informed and expressed understanding.

## 2023-04-24 NOTE — Telephone Encounter (Signed)
Patient needs to have a tooth xrayed and his dentist wanted to make sure that it was ok with you to be seen.  Please advise.  [phone]

## 2023-04-25 ENCOUNTER — Ambulatory Visit: Payer: Medicare HMO | Admitting: Occupational Therapy

## 2023-04-25 ENCOUNTER — Ambulatory Visit: Payer: Medicare HMO | Admitting: Physical Therapy

## 2023-04-25 ENCOUNTER — Ambulatory Visit: Payer: Medicare HMO

## 2023-05-03 ENCOUNTER — Ambulatory Visit: Payer: Medicare HMO

## 2023-05-03 ENCOUNTER — Ambulatory Visit: Payer: Medicare HMO | Admitting: Occupational Therapy

## 2023-05-05 ENCOUNTER — Ambulatory Visit: Payer: Medicare HMO

## 2023-05-10 ENCOUNTER — Ambulatory Visit: Payer: Medicare HMO

## 2023-05-10 ENCOUNTER — Ambulatory Visit: Payer: Medicare HMO | Admitting: Physical Therapy

## 2023-05-10 ENCOUNTER — Ambulatory Visit: Payer: Medicare HMO | Admitting: Occupational Therapy

## 2023-05-15 ENCOUNTER — Encounter: Payer: Self-pay | Admitting: Internal Medicine

## 2023-05-16 ENCOUNTER — Ambulatory Visit: Payer: Medicare HMO | Attending: Neurology | Admitting: Occupational Therapy

## 2023-05-16 ENCOUNTER — Encounter: Payer: Self-pay | Admitting: Physical Therapy

## 2023-05-16 ENCOUNTER — Ambulatory Visit: Payer: Medicare HMO

## 2023-05-16 ENCOUNTER — Ambulatory Visit: Payer: Medicare HMO | Admitting: Physical Therapy

## 2023-05-16 ENCOUNTER — Other Ambulatory Visit: Payer: Self-pay | Admitting: Internal Medicine

## 2023-05-16 DIAGNOSIS — R278 Other lack of coordination: Secondary | ICD-10-CM | POA: Insufficient documentation

## 2023-05-16 DIAGNOSIS — R2689 Other abnormalities of gait and mobility: Secondary | ICD-10-CM | POA: Diagnosis not present

## 2023-05-16 DIAGNOSIS — I611 Nontraumatic intracerebral hemorrhage in hemisphere, cortical: Secondary | ICD-10-CM

## 2023-05-16 DIAGNOSIS — R41842 Visuospatial deficit: Secondary | ICD-10-CM | POA: Diagnosis not present

## 2023-05-16 DIAGNOSIS — R41840 Attention and concentration deficit: Secondary | ICD-10-CM | POA: Diagnosis not present

## 2023-05-16 DIAGNOSIS — R2681 Unsteadiness on feet: Secondary | ICD-10-CM | POA: Diagnosis not present

## 2023-05-16 DIAGNOSIS — R41841 Cognitive communication deficit: Secondary | ICD-10-CM

## 2023-05-16 NOTE — Therapy (Signed)
OUTPATIENT PHYSICAL THERAPY NEURO TREATMENT   Patient Name: Mark Cohen MRN: [id number] DOB:05-15-1951, 72 y.o., male Today's Date: 05/16/2023   PCP: Etta Grandchild, MD REFERRING PROVIDER: Micki Riley, MD   END OF SESSION:  PT End of Session - 05/16/23 1322     Visit Number 2    Number of Visits 13    Date for PT Re-Evaluation 06/02/23    Authorization Type [employer]    PT Start Time 1320    PT Stop Time 1359    PT Time Calculation (min) 39 min    Activity Tolerance Patient tolerated treatment well    Behavior During Therapy WFL for tasks assessed/performed              Past Medical History:  Diagnosis Date   Allergic rhinitis    Atrial fibrillation (HCC)    Colon polyps    Epididymal cyst    right   GERD (gastroesophageal reflux disease)    Hepatitis 1961   hepatitis a as child, no liver oproblems since   Hip pain    both   Past Surgical History:  Procedure Laterality Date   APPENDECTOMY     colonscopy  07/27/2016   EPIDIDYMECTOMY  08/29/2002   Sees Dr Patsi Sears twice a year left side   EPIDIDYMECTOMY Right 09/09/2016   Procedure: EPIDIDYMECTOMY;  Surgeon: Jethro Bolus, MD;  Location: [facility];  Service: Urology;  Laterality: Right;   IR IVC FILTER PLMT / S&I /IMG GUID/MOD SED  04/04/2023   IR IVC FILTER RETRIEVAL / S&I /IMG GUID/MOD SED  04/04/2023   NASAL SINUS SURGERY  2008   Patient Active Problem List   Diagnosis Date Noted   ICH (intracerebral hemorrhage) (HCC) 04/03/2023   D-dimer, elevated 03/31/2023   Paroxysmal atrial fibrillation (HCC) 03/31/2023   Atrial fibrillation with RVR (HCC) 03/29/2023   Elevated brain natriuretic peptide (BNP) level 03/29/2023   Sacro-iliac pain 02/03/2023   Chronic bilateral low back pain without sciatica 04/07/2022   Chronic pain of both hips 04/07/2022   Gastroesophageal reflux disease without esophagitis 10/12/2021   Polyp of colon 10/12/2021   Plantar fasciitis 04/18/2019   Benign  prostatic hyperplasia without lower urinary tract symptoms 04/24/2018   Carpal tunnel syndrome on both sides 06/22/2017   Hypogonadism male 02/09/2017   Hyperlipidemia with target LDL less than 130 01/14/2016   GAD (generalized anxiety disorder) 01/21/2015   DJD (degenerative joint disease), multiple sites 05/10/2013   Left lumbar radiculitis 08/12/2011   Essential hypertension, benign 08/12/2011   Routine general medical examination at a health care facility 04/28/2011   ALLERGIC RHINITIS 01/29/2010    ONSET DATE: 04/06/2023  REFERRING DIAG: I61.9 (ICD-10-CM) - Nontraumatic intracerebral hemorrhage, unspecified cerebral location, unspecified laterality (HCC)   THERAPY DIAG:  Unsteadiness on feet  Other abnormalities of gait and mobility  Rationale for Evaluation and Treatment: Rehabilitation  SUBJECTIVE:  SUBJECTIVE STATEMENT: Feel like vision is a little better.  Did some raking and yard work.  Was able to rake the leave and bag them.  Pt accompanied by: significant other  PERTINENT HISTORY: Hospitalized 9/23-9/26/2024: Intracerebral Hemorrhage: left parieto-occipital convexity ICH with surrounding vasogenic edema without significant mass effect or midline shift; IVC filter placed; PMH of A-fib and PE, HTN, HLD, chronic R cranial nerve 6 palsy, LBP  PAIN:  Are you having pain? No  PRECAUTIONS: Fall and Other: no peripheral vision R side    No driving  RED FLAGS: None   WEIGHT BEARING RESTRICTIONS: No  FALLS: Has patient fallen in last 6 months? No  LIVING ENVIRONMENT: Lives with: lives with their spouse Lives in: House/apartment-one level Stairs: Yes: External: 2 steps; none Has following equipment at home: Single point cane  PLOF: Independent  PATIENT GOALS: Pt's goals for  therapy are to get back to independent walking and to drive again  OBJECTIVE:    TODAY'S TREATMENT: 05/16/2023 Activity Comments  Corner balance exercises: Feet apart/feet together EO and EC head turns/nods 5 reps Increased sway with EC  Head turns with gait 40 ft x 3 Slowed pace  Head nods with gait 40 ft x 3  Slowed pace  Gait velocity:  4 ft/sec          Access Code: [email or web address] URL: [email or web address] Date: 05/16/2023 Prepared by: [facility] - [facility]  Exercises - Corner Balance Feet Together With Eyes Open  - 1 x daily - 7 x weekly - 2 sets - 5 reps - Walking with Head Rotation  - 1-2 x daily - 7 x weekly - 1 sets - 2-3 reps - Walking with Head Nod  - 1-2 x daily - 7 x weekly - 1 sets - 2-3 reps  PATIENT EDUCATION: Education details: HEP initiated-see above; pt describes tightness and swelling at times with LLE new in past few weeks; no overt swelling, redness, warmth or pain-PT advised pt/wife to follow up with MD  Person educated: Patient and Spouse Education method: Explanation, Demonstration, and Handouts Education comprehension: verbalized understanding, returned demonstration, and needs further education  ---------------------------------------------------------------- Note: Objective measures below were completed at Evaluation unless otherwise noted.  DIAGNOSTIC FINDINGS: See above  COGNITION: Overall cognitive status: Within functional limits for tasks assessed  Tearful at times; he answers questions differently than wife (as far as his movement abilities) SENSATION: Light touch: WFL  COORDINATION: WFL   MUSCLE TONE: WFL BLEs   POSTURE: rounded shoulders and forward head  LOWER EXTREMITY ROM:   WFL BLEs  Active  Right Eval Left Eval  Hip flexion    Hip extension    Hip abduction    Hip adduction    Hip internal rotation    Hip external rotation    Knee flexion    Knee extension    Ankle dorsiflexion     Ankle plantarflexion    Ankle inversion    Ankle eversion     (Blank rows = not tested)  LOWER EXTREMITY MMT:  BLE 5/5 throughout  MMT Right Eval Left Eval  Hip flexion    Hip extension    Hip abduction    Hip adduction    Hip internal rotation    Hip external rotation    Knee flexion    Knee extension    Ankle dorsiflexion    Ankle plantarflexion    Ankle inversion    Ankle eversion    (Blank rows =  not tested)   TRANSFERS: Assistive device utilized: None  Sit to stand: Modified independence Stand to sit: Modified independence  STAIRS: Level of Assistance: SBA Stair Negotiation Technique: Alternating Pattern  with Single Rail on Left Number of Stairs: 2-3  Height of Stairs: 4-6"   GAIT: Gait pattern:  holds arms in high guard in front of him due to decreased R peripheral vision, step through pattern, decreased step length- Right, and decreased step length- Left Distance walked: 50 ft Assistive device utilized: None Level of assistance: CGA Comments: slowed pace, turns head to look at R peripheral visual field or relies on PT cues  FUNCTIONAL TESTS:  5 times sit to stand: 8.63 sec Timed up and go (TUG): 16.68 sec 10 meter walk test: NT Dynamic Gait Index: 11/24 see below  M-CTSIB  Condition 1: Firm Surface, EO 30 Sec, Normal Sway  Condition 2: Firm Surface, EC 30 Sec, Mild Sway  Condition 3: Foam Surface, EO 30 Sec, Mild Sway  Condition 4: Foam Surface, EC 30 Sec, Moderate Sway       PATIENT SURVEYS:  FOTO NP-staff did not capture  TODAY'S TREATMENT:                                                                                                                              DATE: 04/18/2023    PATIENT EDUCATION: Education details: Eval results, POC Person educated: Patient and Spouse Education method: Explanation Education comprehension: verbalized understanding  HOME EXERCISE PROGRAM: Not yet initiated  GOALS: Goals reviewed with patient?  Yes  SHORT TERM GOALS: Target date: 05/19/2023  Pt will be independent with HEP for improved balance, gait. Baseline: Goal status: INITIAL  2.  Pt will improve Condition 4 on MCTSIB to mild sway for improved balance. Baseline:  Goal status: INITIAL  3.  Pt will improve TUG score to less than or equal to 13 sec for decreased fall risk. Baseline: 16.68 sec Goal status: INITIAL   LONG TERM GOALS: Target date: 06/02/2023  Pt will be independent with HEP for improved balance and gait. Baseline:  Goal status: INITIAL  2.  Pt will improve DGI score to at least 19/24 to decrease fall risk. Baseline: 11/24 Goal status: INITIAL  3.  Gait velocity to be assessed and goal to be written as appropriate. Baseline: 4 ft/sec 05/16/2023 Goal status: DEFERRED   ASSESSMENT:  CLINICAL IMPRESSION: Pt returns for first visit today after PT eval, due to multiple cancellations for other medical issues/appointments.  Pt does report improved vision and improved participation in yard work and community activities.  Initiated HEP today to address vestibular system use for balance.  He tolerates well and demo understanding.  He is somewhat hesitant to schedule further appointments due to additional medical appointments and overall feeling better.  Will keep chart open, as PT recommends PT return visit to review and progress HEP.   OBJECTIVE IMPAIRMENTS: Abnormal gait, decreased balance, decreased mobility, difficulty walking, and  impaired vision/preception.   ACTIVITY LIMITATIONS: carrying, stairs, and locomotion level  PARTICIPATION LIMITATIONS: meal prep, cleaning, laundry, driving, shopping, and community activity  PERSONAL FACTORS: 3+ comorbidities: See above for PMH  are also affecting patient's functional outcome.   REHAB POTENTIAL: Good  CLINICAL DECISION MAKING: Evolving/moderate complexity  EVALUATION COMPLEXITY: Moderate  PLAN:  PT FREQUENCY: 1-2x/week  PT DURATION: 6 weeks plus  eval  PLANNED INTERVENTIONS: Therapeutic exercises, Therapeutic activity, Neuromuscular re-education, Balance training, Gait training, Patient/Family education, Self Care, Stair training, and DME instructions  PLAN FOR NEXT SESSION: Review and progress HEP for standing balance-counter and corner balance  Steen Bisig W., PT 05/16/2023, 1:59 PM  [facility] at [facility] [address] [place], [place] [address] Phone # [phone] Fax # [phone]

## 2023-05-16 NOTE — Therapy (Addendum)
 OUTPATIENT OCCUPATIONAL THERAPY NEURO Treatment  Patient Name: Mark Cohen MRN: [id number] DOB:[birth year], 72 y.o., male Today's Date: 05/16/2023  PCP: Etta Grandchild, MD  REFERRING PROVIDER: Micki Riley, MD  END OF SESSION:  OT End of Session - 05/16/23 1426     Visit Number 2    Number of Visits 16   anticipate 1x per week for 8 weeks (8 visits), may increase to 2x per week for 8 weeks (16 visits) PRN   Date for OT Re-Evaluation 06/13/23    Authorization Type [employer] 2024 - Auth not required    Progress Note Due on Visit 10    OT Start Time 1232    OT Stop Time 1316    OT Time Calculation (min) 44 min    Activity Tolerance Patient tolerated treatment well    Behavior During Therapy [facility] for tasks assessed/performed              Past Medical History:  Diagnosis Date   Allergic rhinitis    Atrial fibrillation (HCC)    Colon polyps    Epididymal cyst    right   GERD (gastroesophageal reflux disease)    Hepatitis 1961   hepatitis a as child, no liver oproblems since   Hip pain    both   Past Surgical History:  Procedure Laterality Date   APPENDECTOMY     colonscopy  07/27/2016   EPIDIDYMECTOMY  08/29/2002   Sees Dr Patsi Sears twice a year left side   EPIDIDYMECTOMY Right 09/09/2016   Procedure: EPIDIDYMECTOMY;  Surgeon: Jethro Bolus, MD;  Location: [facility];  Service: Urology;  Laterality: Right;   IR IVC FILTER PLMT / S&I /IMG GUID/MOD SED  04/04/2023   IR IVC FILTER RETRIEVAL / S&I /IMG GUID/MOD SED  04/04/2023   NASAL SINUS SURGERY  2008   Patient Active Problem List   Diagnosis Date Noted   ICH (intracerebral hemorrhage) (HCC) 04/03/2023   D-dimer, elevated 03/31/2023   Paroxysmal atrial fibrillation (HCC) 03/31/2023   Atrial fibrillation with RVR (HCC) 03/29/2023   Elevated brain natriuretic peptide (BNP) level 03/29/2023   Sacro-iliac pain 02/03/2023   Chronic bilateral low back pain without sciatica 04/07/2022   Chronic pain of  both hips 04/07/2022   Gastroesophageal reflux disease without esophagitis 10/12/2021   Polyp of colon 10/12/2021   Plantar fasciitis 04/18/2019   Benign prostatic hyperplasia without lower urinary tract symptoms 04/24/2018   Carpal tunnel syndrome on both sides 06/22/2017   Hypogonadism male 02/09/2017   Hyperlipidemia with target LDL less than 130 01/14/2016   GAD (generalized anxiety disorder) 01/21/2015   DJD (degenerative joint disease), multiple sites 05/10/2013   Left lumbar radiculitis 08/12/2011   Essential hypertension, benign 08/12/2011   Routine general medical examination at a health care facility 04/28/2011   ALLERGIC RHINITIS 01/29/2010    ONSET DATE: 04/06/2023 (referral date), 04/03/23 (date of ICH)  REFERRING DIAG: I61.9 (ICD-10-CM) - Nontraumatic intracerebral hemorrhage, unspecified cerebral location, unspecified laterality (HCC)  THERAPY DIAG:  Visuospatial deficit  Attention and concentration deficit  Other lack of coordination  Rationale for Evaluation and Treatment: Rehabilitation  SUBJECTIVE:   SUBJECTIVE STATEMENT: Pt reports sometimes riding in the car gets him feeling "wonky".  Pt reports peripheral vision has gotten a little better, can perceive movement in R periphery but still no clear hand until closer to in front of self.    Pt accompanied by: self and significant other  PERTINENT HISTORY: Per 04/06/23 d/c summary: left parieto-occipital ICH,  AFIB, hx of recent pulmonary embolism and multiple DVTs, chronic low back pain without sciatica, GERD, plantar fascitis, hyperlipidemia, BUE carpal tunnel syndrome, GAD, HTN, apraxia and right hemianopsia, chronic R CN 6 palsy  04/06/23 - DISCHARGE PRIMARY DIAGNOSIS:  Intracerebral Hemorrhage:  left parieto-occipital convexity ICH with surrounding vasogenic edema without significant mass effect or midline shift following Xarelto initiation 2 days PTA. S/p Andexxa administration for reversal. 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 No  FALLS: Has patient fallen in last 6 months? No  LIVING ENVIRONMENT: Lives with: lives with their spouse Lives in: House/apartment Stairs: Yes: External: 2 steps; none Has following equipment at home: Single point cane though currently not using cane for walking  PLOF: Independent with I/ADLs and driving  PATIENT GOALS: Pt reports "I want to return to cutting the grass and yard work and other outdoor activities and feel safe doing it." Pt's spouse reports hope for pt to be more ind with some daily tasks, such as grocery shopping.  OBJECTIVE:  Note: Objective measures were completed at Evaluation unless otherwise noted.  HAND DOMINANCE: Right  ADLs: Overall ADLs: ind for BADLs Transfers/ambulation related to ADLs: Eating: ind Grooming: ind UB Dressing: ind LB Dressing: ind Toileting: ind Bathing: ind Tub Shower transfers: ind (walk-in shower) Equipment: Walk in shower  IADLs: Shopping: dependent Light housekeeping: ind Meal Prep: dependent for stovetop and oven cooking tasks, pt prepares sandwiches ind Community mobility: dependent Medication management: with supervision and check-ins from spouse to verify Financial management: with supervision and assistance from spouse Handwriting: 100% legible and Increased time  04/18/23 - Pt copied 26/26 letters within a simple sentence. Pt's spouse reports pt writes with significantly decreased speed compared to PLOF. OT noted good spacing and legibility today though demo'd below-baseline alignment for 4 out of 9 words. Pt demonstrates some difficulty with fully completing letters (e.g. not crossing "A" and "x.") Pt reported that 9-word sentence appeared to be "a lot" for writing.  MOBILITY STATUS: Independent  POSTURE COMMENTS:  No  Significant postural limitations Sitting balance:  ind  ACTIVITY TOLERANCE: Activity tolerance: Good  FUNCTIONAL OUTCOME MEASURES: FOTO: Staff did not capture  UPPER EXTREMITY ROM:     BUE ROM noted to be [facility]  UPPER EXTREMITY MMT:     MMT Right eval Left eval  Shoulder flexion 5 5  Shoulder abduction 5 5  Shoulder adduction 5 5  Shoulder extension 5 5  Shoulder internal rotation    Shoulder external rotation    Middle trapezius    Lower trapezius    Elbow flexion 5 5  Elbow extension 5 5  Wrist flexion    Wrist extension    Wrist ulnar deviation    Wrist radial deviation    Wrist pronation    Wrist supination    (Blank rows = not tested)  HAND FUNCTION: Grip strength: Right: 67 lbs; Left: 67 lbs  COORDINATION: 9 Hole Peg test: Right: 37 sec; Left: 23 sec  OT noted pt turned his head in order to visually see pegs on R side, which may have increased time required for RUE.  SENSATION: Pt reported no sensation changes since ICH.  EDEMA: OT noted no edema on B hands.  MUSCLE TONE: Not tested. OT did not note any overt signs and symptoms of muscle tone.  COGNITION: Overall cognitive status:  A&O: 4  out of 4 Pt recalled 3 out of 3 words after approx. 3 minutes.  Additional cognitive abilities assessed by SLP, please see SLP note. OT plans to further assess cognitive abilities during functional ADL/IADL tasks. OT noted pt sometimes required repeated verbal instructions.  VISION: Subjective report: Pt reports visual field cut on R side at "1 o'clock." Pt reports double vision when reading and therefore closes one eye to read information on a page. Pt reports decreased ability to locate walls when walking d/t poor depth perception and therefore uses outstretched arms to locate walls.  Baseline vision: Wears glasses all the time   Visual history: Per 04/06/23 d/c summary: right hemianopsia, chronic R CN 6 palsy.  VISION ASSESSMENT: Impaired Visual Fields: Right  visual field deficits Pt demonstrates no vision on R side until approx. 30 degrees from midline ("1 o'clock").  Patient has difficulty with following activities due to following visual impairments: Navigating environment, locating objects, persons, and obstacles on R side.  PERCEPTION: Not tested  PRAXIS: Not tested  OBSERVATIONS: Pt was accompanied by spouse today. Pt appeared well-kept. Pt ambulated ind with no LOB noted. Pt reports owning a cane though does not use cane for ambulation currently. Pt's spouse walked on pt's right side when exiting clinic today and pt's spouse confirmed that she intentionally walked on pt's right side. Pt reported being cautious with tasks. Per OT conversation with SLP, pt corrected initial error in visual scanning task with v/c and education. However, pt then continued to make similar errors throughout task despite education.   TODAY'S TREATMENT:                                                                                            DATE:  05/16/23 Visual perception and attention: engaged in table top visual scanning activity "I SPY" and word search.  OT applying orange lines on edges of paper and encouraging pt to scan until he sees the orange line to ensure seeing the entire picture.  Pt demonstrating good vertical scanning, rarely utilizing horizontal scanning.  Encouraged pt to complete horizontal scanning during word search to carry over to reading.  OT educated on use of visual scanning strategies as well as compensatory strategies with placement of paper and head turning.   Meal prep: Pt reports that he has done a few oven meals ([employer]) and is making tea with teapot on stove top and remembering to turn off burner afterwards.   Vision and cognition: pt's spouse brought 4th and 5th grade math workbook as pt utilizing it at home for cognition and visual attention.  OT observed some handwriting, where letters were arching below line  as he fatigued and as words were further in R visual field.  Pt also with areas of omission due to R visual field deficits.  OT encouraged continued use of math as well as visual puzzles for attention and cognition.  04/18/23 - OT provided education to pt and pt's spouse regarding OT role, POC, and visual scanning/compensation strategies for safety at home and in community. Pt and pt's spouse verbalized understanding. Handouts provided (see pt instructions).  PATIENT EDUCATION: Education details: see today's treatment above Person educated: Patient and Spouse Education method: Explanation, Demonstration, and Handouts Education comprehension: verbalized understanding, returned demonstration, and needs further education  HOME EXERCISE PROGRAM: 04/18/23 - Visual scanning and visual compensation strategies handouts (see pt instructions)   GOALS: Goals reviewed with patient? Yes  SHORT TERM GOALS: Target date: 05/16/23  Pt will demonstrate or verbalize compliance with vision compensation strategies and vision HEP. Baseline: Goal status: MET - 05/16/23  Pt will be ind with coordination HEP. Baseline: Goal status: Still not initiated - 05/16/23  3. Pt will complete tabletop scanning activities with no v/c for vision compensation strategies and identifying errors. Baseline: Goal status: MET - 05/16/23   4.  Pt will report making a simple stovetop meal with no more than supervision assistance while attending to safety precautions. Baseline: Pt can make simple sandwich though not attempting stovetop or oven meals d/t pt and pt's spouse concerns about safety. Goal status: Partially met - Pt has completed oven meal ([employer] meal) and is making tea on the stove top but does not do any other stovetop cooking.  05/16/23    LONG TERM GOALS: Target date: 06/13/23  Pt will complete 9-hole peg test in 32 seconds or less using visual compensation strategies PRN with dominant RUE. Baseline:  Right: 37 sec Goal status: IN PROGRESS  2.  Pt will copy a simple sentence with appropriate speed and less than 25% errors with baseline alignment using visual compensation strategies PRN. Baseline: Pt demonstrated 4 out of 9 below-line alignment when writing a simple sentence. Pt and pt's spouse report significantly decreased writing speed compared to PLOF. Goal status: IN PROGRESS  3. Pt will navigate a moderately busy environment no more than 1 v/c to identify major obstacles in environment. Baseline: Pt's spouse reports pt is unable to locate items at grocery store currently. Goal status: IN PROGRESS    ASSESSMENT:  CLINICAL IMPRESSION: Pt has not been seen since initial evaluation 04/18/23, however is engaging in math workbook which includes word puzzles.  OT reiterated continued use and educated on functional carryover with rounding and estimation as needed for grocery shopping and other aspects of meal prep.  Pt demonstrating decreased recognition of items when they were smaller or upside down in "I SPY" activity.  OT educating on functional implications of form constancy.  Pt would benefit from additional skilled OT services to further address coordination, handwriting, and attention in moderately busy environment.    PERFORMANCE DEFICITS: in functional skills including IADLs, coordination, dexterity, Fine motor control, vision, and UE functional use, cognitive skills including learn, memory, problem solving, safety awareness, and sequencing, and psychosocial skills including coping strategies and environmental adaptation.     PLAN:  OT FREQUENCY: 1-2x/week  OT DURATION: 8 weeks  PLANNED INTERVENTIONS: self care/ADL training, therapeutic exercise, therapeutic activity, neuromuscular re-education, manual therapy, manual lymph drainage, passive range of motion, functional mobility training, electrical stimulation, ultrasound, paraffin, fluidotherapy, moist heat, cryotherapy,  patient/family education, cognitive remediation/compensation, visual/perceptual remediation/compensation, energy conservation, DME and/or AE instructions, and Re-evaluation  RECOMMENDED OTHER SERVICES: Pt also participated in PT and SLP evaluations today  CONSULTED AND AGREED WITH PLAN OF CARE: Patient and pt's spouse  PLAN FOR NEXT SESSION:  Review visual compensation strategies PRN FM coordination tasks Initiate coordination HEP Practice writing tasks to improve FM coordination and dexterity Further assess cognitive abilities during functional ADL/IADL tasks.   Rosalio Loud, OT 05/16/2023, 2:27 PM   OCCUPATIONAL THERAPY DISCHARGE SUMMARY  Visits  from Start of Care: 2  Current functional level related to goals / functional outcomes: Unsure as pt did not return from last (above visit).  Pt did sent MyChart msg to cancel remaining appt with returned msg to confirm cancel.  Pt was utilizing workbook to continue to address vision and cognition.    Remaining deficits: Visual spatial impairments, coordination   Education / Equipment: Educated on visual scanning and compensation, and form constancy   Patient agrees to discharge. Patient goals were not met. Patient is being discharged due to not returning since the last visit.  Rosalio Loud, OT 09/18/23

## 2023-05-16 NOTE — Telephone Encounter (Signed)
Echo was completed on 04/01/23

## 2023-05-16 NOTE — Therapy (Signed)
OUTPATIENT SPEECH LANGUAGE PATHOLOGY TREATMENT   Patient Name: Mark Cohen MRN: [id number] DOB:1951-05-09, 72 y.o., male Today's Date: 05/16/2023  PCP: Etta Grandchild, MD REFERRING PROVIDER: Lina Sayre, MD  END OF SESSION:  End of Session - 05/16/23 1406     Visit Number 2    Number of Visits 17    Date for SLP Re-Evaluation 06/17/23    SLP Start Time 1404    SLP Stop Time  1445    SLP Time Calculation (min) 41 min    Activity Tolerance Patient tolerated treatment well             Past Medical History:  Diagnosis Date   Allergic rhinitis    Atrial fibrillation (HCC)    Colon polyps    Epididymal cyst    right   GERD (gastroesophageal reflux disease)    Hepatitis 1961   hepatitis a as child, no liver oproblems since   Hip pain    both   Past Surgical History:  Procedure Laterality Date   APPENDECTOMY     colonscopy  07/27/2016   EPIDIDYMECTOMY  08/29/2002   Sees Dr Patsi Sears twice a year left side   EPIDIDYMECTOMY Right 09/09/2016   Procedure: EPIDIDYMECTOMY;  Surgeon: Jethro Bolus, MD;  Location: [facility];  Service: Urology;  Laterality: Right;   IR IVC FILTER PLMT / S&I /IMG GUID/MOD SED  04/04/2023   IR IVC FILTER RETRIEVAL / S&I /IMG GUID/MOD SED  04/04/2023   NASAL SINUS SURGERY  2008   Patient Active Problem List   Diagnosis Date Noted   ICH (intracerebral hemorrhage) (HCC) 04/03/2023   D-dimer, elevated 03/31/2023   Paroxysmal atrial fibrillation (HCC) 03/31/2023   Atrial fibrillation with RVR (HCC) 03/29/2023   Elevated brain natriuretic peptide (BNP) level 03/29/2023   Sacro-iliac pain 02/03/2023   Chronic bilateral low back pain without sciatica 04/07/2022   Chronic pain of both hips 04/07/2022   Gastroesophageal reflux disease without esophagitis 10/12/2021   Polyp of colon 10/12/2021   Plantar fasciitis 04/18/2019   Benign prostatic hyperplasia without lower urinary tract symptoms 04/24/2018   Carpal tunnel syndrome on both sides  06/22/2017   Hypogonadism male 02/09/2017   Hyperlipidemia with target LDL less than 130 01/14/2016   GAD (generalized anxiety disorder) 01/21/2015   DJD (degenerative joint disease), multiple sites 05/10/2013   Left lumbar radiculitis 08/12/2011   Essential hypertension, benign 08/12/2011   Routine general medical examination at a health care facility 04/28/2011   ALLERGIC RHINITIS 01/29/2010    ONSET DATE: 04/03/23   REFERRING DIAG: I61.9 (ICD-10-CM) - Nontraumatic intracerebral hemorrhage, unspecified cerebral location, unspecified laterality (HCC)  THERAPY DIAG:  Cognitive communication deficit  Rationale for Evaluation and Treatment: Rehabilitation  SUBJECTIVE:   SUBJECTIVE STATEMENT: "Well those pointers you gave me on drawing the clock were helpful." Pt explained OT session to SLP.  Pt accompanied by: significant other -wife [name]  PERTINENT HISTORY:  history of atrial fibrillation, recent PE and 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 (04/06/23) DISCHARGE PRIMARY DIAGNOSIS:  Intracerebral Hemorrhage:  left parieto-occipital convexity ICH with surrounding vasogenic edema without significant mass effect or midline shift following Xarelto initiation 2 days PTA. S/p Andexxa administration for reversal.  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 No  FALLS: Has patient fallen in last 6 months?  No   PATIENT GOALS: Return as close to baseline as  possible  OBJECTIVE:  Note: Objective measures were completed at Evaluation unless otherwise noted.  DIAGNOSTIC FINDINGS:  SLE 04/05/23:  Clinical Impression Pt reports living at home with his wife and completing all ADLs independently. He presents with a moderate cognitive impairment evidenced by his performance on the SLUMS. He scored 12/30 (a score of 27 or above is considered WFL) characterized by  deficits related to memory, attention, problem solving, and planning. He could not recall any of the five objects after a delay. He was only able to name seven animals in a one minute divergent naming task with four repetitions. During the clock task, pt with some deficits related to planning. He was also unable to draw the hands of the clock, instead repeatedly writing "10:45" when prompted to set the time to "ten minutes until 11" and given Mod verbal cueing. He appeared to have difficulty locating the shapes and required increased support. Overall, feel that pt is experiencing acute changes related to cognition and may benefit from ongoing SLP f/u on an OP basis to facilitate return to PLOF. Will continue to follow acutely.  STANDARDIZED ASSESSMENTS:  Cognitive Linguistic Quick Test  AGE - 70-89   The Cognitive Linguistic Quick Test (CLQT) was administered to assess the relative status of five cognitive domains: attention, memory, language, executive functioning, and visuospatial skills. Scores from 10 tasks were used to estimate severity ratings (standardized for age groups 18-69 years and 70-89 years) for each domain, a clock drawing task, as well as an overall composite severity rating of cognition.       Task Score Criterion Cut Scores  Personal Facts 8/8 8  Symbol Cancellation 3/12 10  Confrontation Naming 10/10 10  Clock Drawing  9/13 11  Story Retelling 8/10 5  Symbol Trails 10/10 6  Generative Naming 7/9 4  Design Memory 5/6 4  Mazes  8/8 4  Design Generation 7/13 5    Cognitive Domain Composite Score Severity Rating  Attention 122/215 Mild  Memory 161/185 WNL  Executive Function 30/40 WNL  Language 33/37 WNL  Visuospatial Skills 77/105 WNL  Clock Drawing  9/13 Mild  Composite Severity Rating  WNL     PATIENT REPORTED OUTCOME MEASURES (PROM): Cognitive Function: to be provided first therapy session.   TODAY'S TREATMENT:                                                                                                                                          DATE:  05/16/23: CLQT completed today, pt's composite score was WNL. Other results above. SLP suspects that pt's vision deficit depressed pt's clock drawing score, and his symbol cancellation score. Pt admits he experienced double vision during evaluation.  SLP provided homework for detailed work/responses to complete prior to next session next week.   04/18/23 (eval): n/a   PATIENT EDUCATION: Education details: Results of subtests, role of SLP in cognitive  communication therapy, SLP prefers therapy frequency/duration at 1-2 times per week x8 weeks Person educated: Patient and Spouse Education method: Medical illustrator Education comprehension: verbalized understanding   GOALS: Goals reviewed with patient? No  SHORT TERM GOALS: Target date: 05/19/23  Pt will complete cognitive communication assessment and Cognition PROM in first therapy session; Goals added PRN Baseline: Goal status: INITIAL  2.  Joe will demo appropriate planning and organization in mod complex cognitive communication tasks in 3 sessions Baseline:  Goal status: INITIAL  3.  Pt will demo 95% error awareness after double checking functional detailed written and/or verbal tasks in 3 sessions Baseline:  Goal status: INITIAL  4.  Pt will demo strategies for error awareness in mod complex cognitive communication (cog-comm) tasks independently in 3 sessions Baseline:  Goal status: INITIAL  5.  Pt will demo strategies for planning and organization with rare min A independently in 3 sessions Baseline:  Goal status: INITIAL   LONG TERM GOALS: Target date: 06/17/23  Pt will improve PROM from initial administration Baseline:  Goal status: INITIAL  2.  Pt will demo strategies for planning and organization independently in mod complex/complex cog-comm tasks in 3 sessions Baseline:  Goal status: INITIAL  3.  Pt will demo  strategies for error awareness in mod complex/complex cog-comm tasks independently in 3 sessions Baseline:  Goal status: INITIAL  4.  Joe will demo more WNL processing time with /mod complex novel cog-comm tasks in 3 sessions.  Baseline:  Goal status: INITIAL  ASSESSMENT:  CLINICAL IMPRESSION: "Gabriel Rung" is a 72 y.o. M who was seen today for assessment of cognitive communication after an ICH suffered 04/03/23, with CLQT. Pt's composite score was WNL, with likely depressed scores with clock drawing and symbol cancellation due to double vision on day of initial eval (04/18/23). Today pt appeared more aware of his surroundings and admittedly did not still have double vision. 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 SLP will keep current STGs and LTGs until more therapy is completed - however, SLP suspects pt will have these goals modified in the next 2-3 sessions.   OBJECTIVE IMPAIRMENTS: include awareness and executive functioning  . These impairments are limiting patient from household responsibilities, ADLs/IADLs, and effectively communicating at home and in community. Factors affecting potential to achieve goals and functional outcome are  awareness of deficits(?) .Marland Kitchen Patient will benefit from skilled SLP services to address above impairments and improve overall function.  REHAB POTENTIAL: Good  PLAN:  SLP FREQUENCY: 2x/week  SLP DURATION: 8 weeks  PLANNED INTERVENTIONS: Cueing hierachy, Cognitive reorganization, Internal/external aids, Functional tasks, SLP instruction and feedback, Compensatory strategies, and Patient/family education    [facility], CCC-SLP 05/16/2023, 2:06 PM

## 2023-05-17 ENCOUNTER — Ambulatory Visit: Payer: Medicare HMO | Admitting: Physical Therapy

## 2023-05-17 ENCOUNTER — Ambulatory Visit: Payer: Medicare HMO | Admitting: Occupational Therapy

## 2023-05-17 ENCOUNTER — Ambulatory Visit: Payer: Medicare HMO

## 2023-05-30 ENCOUNTER — Encounter: Payer: Self-pay | Admitting: Internal Medicine

## 2023-06-04 ENCOUNTER — Ambulatory Visit
Admission: RE | Admit: 2023-06-04 | Discharge: 2023-06-04 | Disposition: A | Discharge status: Home / Self Care | Payer: Medicare HMO | Source: Ambulatory Visit | Attending: Internal Medicine | Admitting: Internal Medicine

## 2023-06-04 DIAGNOSIS — I618 Other nontraumatic intracerebral hemorrhage: Secondary | ICD-10-CM | POA: Diagnosis not present

## 2023-06-04 DIAGNOSIS — I611 Nontraumatic intracerebral hemorrhage in hemisphere, cortical: Secondary | ICD-10-CM

## 2023-06-04 DIAGNOSIS — Z09 Encounter for follow-up examination after completed treatment for conditions other than malignant neoplasm: Secondary | ICD-10-CM | POA: Diagnosis not present

## 2023-06-06 ENCOUNTER — Encounter: Payer: Self-pay | Admitting: Cardiology

## 2023-06-06 ENCOUNTER — Ambulatory Visit: Payer: Medicare HMO | Attending: Cardiology | Admitting: Cardiology

## 2023-06-06 VITALS — BP 120/80 | HR 78 | Ht 72.0 in | Wt 163.0 lb

## 2023-06-06 DIAGNOSIS — E785 Hyperlipidemia, unspecified: Secondary | ICD-10-CM

## 2023-06-06 DIAGNOSIS — I10 Essential (primary) hypertension: Secondary | ICD-10-CM | POA: Diagnosis not present

## 2023-06-06 DIAGNOSIS — I2699 Other pulmonary embolism without acute cor pulmonale: Secondary | ICD-10-CM | POA: Diagnosis not present

## 2023-06-06 DIAGNOSIS — I480 Paroxysmal atrial fibrillation: Secondary | ICD-10-CM | POA: Diagnosis not present

## 2023-06-06 NOTE — Progress Notes (Signed)
Cardiology Office Note:  .   Date:  06/06/2023  ID:  Mark Cohen, DOB 1951/06/14, MRN [id number] PCP: Etta Grandchild, MD  [facility] Providers Cardiologist:  Donato Schultz, MD     History of Present Illness: .   Mark Cohen is a 72 y.o. male Discussed the use of AI scribe software for clinical note transcription with the patient, who gave verbal consent to proceed.  History of Present Illness   The patient, a 72 year old with a recent history of intracranial hemorrhage (ICH) in September 2024, presents for a new patient evaluation. The ICH, located in the left parietal occipital convexity, was surrounded by vasogenic edema without significant mass effect or midline shift. The ICH occurred following initiation of Xarelto, which was subsequently reversed with Andexxa. The etiology of the ICH is thought to be either amyloid angiopathy or primary hemorrhage.  In addition to the ICH, the patient had an acute pulmonary embolism (PE) and was placed on Xarelto. Two days prior to the ICH, the patient had a new onset of atrial fibrillation in the setting of the PE, but returned to sinus rhythm shortly thereafter.  The patient reports no current use of blood thinners and expresses a preference to avoid them in the future. He has been monitoring his heart rhythm with a home EKG device multiple times a day and reports that it has been consistently normal.  The patient also reports a recent brain scan and has an upcoming appointment with a neurologist. He has been engaging in cognitive exercises such as puzzles and math problems to aid in his recovery from the ICH. He has also resumed driving and yard work, activities he enjoys and had set as goals for his recovery.  The patient has an IVC filter in place, which was inserted after the cessation of Xarelto. He reports no current swelling in his legs. The patient's spouse reports that the patient had no significant health issues prior to  these recent events.           Studies Reviewed: .        Results RADIOLOGY Head CT: Intracranial hemorrhage in left parietal occipital convexity with surrounding vasogenic edema without significant mass effect or midline shift (03/2023) Chest CT: Small subsegmental pulmonary embolism (03/2023)  DIAGNOSTIC Echocardiogram: Normal ejection fraction (03/2023)  Risk Assessment/Calculations:            Physical Exam:   VS:  BP 120/80   Pulse 78   Ht 6' (1.829 m)   Wt 163 lb (73.9 kg)   SpO2 99%   BMI 22.11 kg/m    Wt Readings from Last 3 Encounters:  06/06/23 163 lb (73.9 kg)  04/17/23 160 lb (72.6 kg)  04/03/23 165 lb 9.1 oz (75.1 kg)    GEN: Well nourished, well developed in no acute distress NECK: No JVD; No carotid bruits CARDIAC: RRR, no murmurs, no rubs, no gallops RESPIRATORY:  Clear to auscultation without rales, wheezing or rhonchi  ABDOMEN: Soft, non-tender, non-distended EXTREMITIES:  No edema; No deformity   ASSESSMENT AND PLAN: .    Assessment and Plan    Intracranial Hemorrhage (ICH) Recent ICH in September 2024 with left parietal occipital convexity. Surrounding vasogenic edema without significant mass or midline shift. Likely etiology: amyloid angiopathy vs. primary hemorrhage. Not on blood thinners due to risk of recurrent hemorrhage. Patient prefers to avoid blood thinners due to previous adverse events. - Hold off on blood thinners - Follow-up with neurologist on June 22, 2023  Pulmonary Embolism (PE) Acute PE with small subsegmental bilateral vessels. Initially treated with Xarelto, discontinued due to subsequent ICH. Currently protected with an IVC filter. Low risk of significant PE recurrence with IVC filter. - Continue monitoring without blood thinners  Deep Vein Thrombosis (DVT) DVT with clots in the left leg and two in the right leg. Managed with an IVC filter. Avoiding blood thinners due to risk of recurrent hemorrhage and presence of  IVC filter. - Continue monitoring without blood thinners  Atrial Fibrillation (AFib) Brief episode of AFib in the setting of PE, currently in sinus rhythm. No recurrence noted. Continues on Cardizem (diltiazem) for rate control and blood pressure management. - Continue Cardizem (diltiazem)  General Health Maintenance Recovering well and engaging in daily activities such as yard work. No leg swelling noted. Monitoring heart rhythm with a personal EKG device. - Continue daily activities and monitoring heart rhythm with EKG device - Follow-up in six months for reassessment  Follow-up - Schedule follow-up appointment in six months. APP               Signed, Donato Schultz, MD

## 2023-06-06 NOTE — Patient Instructions (Signed)
Medication Instructions:  The current medical regimen is effective;  continue present plan and medications.  *If you need a refill on your cardiac medications before your next appointment, please call your pharmacy*  Follow-Up: At [facility], you and your health needs are our priority.  As part of our continuing mission to provide you with exceptional heart care, we have created designated Provider Care Teams.  These Care Teams include your primary Cardiologist (physician) and Advanced Practice Providers (APPs -  Physician Assistants and Nurse Practitioners) who all work together to provide you with the care you need, when you need it.  We recommend signing up for the patient portal called "MyChart".  Sign up information is provided on this After Visit Summary.  MyChart is used to connect with patients for Virtual Visits (Telemedicine).  Patients are able to view lab/test results, encounter notes, upcoming appointments, etc.  Non-urgent messages can be sent to your provider as well.   To learn more about what you can do with MyChart, go to [email or web address].    Your next appointment:   6 month(s)  Provider:   Dr Anne Fu

## 2023-06-20 ENCOUNTER — Encounter: Payer: Self-pay | Admitting: Internal Medicine

## 2023-06-21 ENCOUNTER — Telehealth: Payer: Self-pay | Admitting: Neurology

## 2023-06-21 NOTE — Telephone Encounter (Signed)
Pt's wife calling to confirm appt details and if the Dr would be able to see MRI results

## 2023-06-22 ENCOUNTER — Ambulatory Visit: Payer: Medicare HMO | Admitting: Neurology

## 2023-06-22 ENCOUNTER — Encounter: Payer: Self-pay | Admitting: Neurology

## 2023-06-22 VITALS — BP 134/84 | HR 66 | Ht 72.0 in | Wt 164.0 lb

## 2023-06-22 DIAGNOSIS — I680 Cerebral amyloid angiopathy: Secondary | ICD-10-CM | POA: Diagnosis not present

## 2023-06-22 DIAGNOSIS — I611 Nontraumatic intracerebral hemorrhage in hemisphere, cortical: Secondary | ICD-10-CM

## 2023-06-22 DIAGNOSIS — H53461 Homonymous bilateral field defects, right side: Secondary | ICD-10-CM

## 2023-06-22 DIAGNOSIS — G3184 Mild cognitive impairment, so stated: Secondary | ICD-10-CM

## 2023-06-22 NOTE — Progress Notes (Signed)
[facility] [address] [place]. [place] [address] [phone]       OFFICE FOLLOW-UP NOTE  [name] Date of Birth:  08/20/1950 Medical Record Number:  [id number]   HPI: Mark Cohen is a 72 year old pleasant Caucasian male seen today for initial office follow-up visit following hospital admission for intracerebral hemorrhage.  He is accompanied by Mark Cohen.  History is obtained from them and review of electronic medical records.  I personally reviewed pertinent available imaging films in PACS.  He has past medical history significant for gastroparesis residual reflux disease, hepatitis, colonic polyps, recent diagnosis of pulmonary embolism with transient A-fib for which she was started on Xarelto only 2 days prior to admission..  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  Patient presented on 04/03/2023 when he developed sudden onset of difficulty with texting.  He tripped over Mark Cohen and was clearly having trouble with Mark vision.  Family called EMS and and CT head on arrival showed a left parieto-occipital convexity parenchymal hemorrhage measuring 5.3 x 4 x 3 cm estimated volume of 32 mL.  ICH score was 1.  Patient blood pressure was tightly controlled.  He was on Xarelto which was reversed using Andexxa.Patient had a recent pulm embolism he had an IVC filter placed some difficulty . He states he has done well since discharge.  He has not had any recurrence of Mark A-fib or any stroke or TIA symptoms.  Still has some right-sided peripheral lower vision difficulties.  He is yet been able to drive.  He has seen Mark Cohen Mark cardiologist just recommended rate control medications and no need for anticoagulation.  On inquiry he admits to some slight memory difficulties but is still quite independent in managing Mark own affairs.  He did have a follow-up MRI scan on 06/04/2023 which showed expected evolutionary changes with reduction in the size of the left parietal  occipital convexity parenchymal hematoma now 4.4 in the long axis compared to 5.3 cm previously with regression of the surrounding edema.  There are extensive chronic blood products noted raising concern for amyloid angiopathy. ROS:   14 system review of systems is positive for vision difficulties, bruising, memory difficulties all other systems negative  PMH:  Past Medical History:  Diagnosis Date   Allergic rhinitis    Atrial fibrillation (HCC)    Colon polyps    Epididymal cyst    right   GERD (gastroesophageal reflux disease)    Hepatitis 1961   hepatitis a as child, no liver oproblems since   Hip pain    both    Social History:  Social History   Socioeconomic History   Marital status: Married    Spouse name: Not on file   Number of children: Not on file   Years of education: Not on file   Highest education level: Bachelor's degree (e.g., BA, AB, BS)  Occupational History   Occupation: Retired  Tobacco Use   Smoking status: Former    Current packs/day: 1.00    Average packs/day: 1 pack/day for 7.0 years (7.0 ttl pk-yrs)    Types: Cigarettes    Passive exposure: Never   Smokeless tobacco: Never  Substance and Sexual Activity   Alcohol use: Yes    Alcohol/week: 21.0 standard drinks of alcohol    Types: 21 Cans of beer per week   Drug use: No   Sexual activity: Not Currently    Partners: Female  Other Topics Concern   Not on file  Social History Narrative   Regular Exercise -  YES         Social Drivers of Health   Financial Resource Strain: Low Risk  (03/28/2023)   Overall Financial Resource Strain (CARDIA)    Difficulty of Paying Living Expenses: Not hard at all  Food Insecurity: No Food Insecurity (04/07/2023)   Hunger Vital Sign    Worried About Running Out of Food in the Last Year: Never true    Ran Out of Food in the Last Year: Never true  Transportation Needs: No Transportation Needs (04/07/2023)   PRAPARE - Administrator, Civil Service  (Medical): No    Lack of Transportation (Non-Medical): No  Physical Activity: Sufficiently Active (03/28/2023)   Exercise Vital Sign    Days of Exercise per Week: 5 days    Minutes of Exercise per Session: 60 min  Stress: No Stress Concern Present (03/28/2023)   [employer] of Occupational Health - Occupational Stress Questionnaire    Feeling of Stress : Not at all  Social Connections: Moderately Isolated (03/28/2023)   Social Connection and Isolation Panel [NHANES]    Frequency of Communication with Friends and Family: More than three times a week    Frequency of Social Gatherings with Friends and Family: Three times a week    Attends Religious Services: Never    Active Member of Clubs or Organizations: No    Attends Banker Meetings: Not on file    Marital Status: Married  Catering manager Violence: Not At Risk (04/05/2023)   Humiliation, Afraid, Rape, and Kick questionnaire    Fear of Current or Ex-Partner: No    Emotionally Abused: No    Physically Abused: No    Sexually Abused: No    Medications:   Current Outpatient Medications on File Prior to Visit  Medication Sig Dispense Refill   ALPRAZolam (XANAX) 0.25 MG tablet Take 1 tablet (0.25 mg total) by mouth 2 (two) times daily as needed. (Patient taking differently: Take 0.25 mg by mouth 2 (two) times daily as needed for anxiety.) 60 tablet 3   Cholecalciferol (DIALYVITE VITAMIN D 5000 PO) Take 1 tablet by mouth daily.     diltiazem (CARDIZEM CD) 120 MG 24 hr capsule Take 1 capsule (120 mg total) by mouth daily. 90 capsule 1   esomeprazole (NEXIUM) 40 MG capsule TAKE 1 CAPSULE BY MOUTH DAILY 90 capsule 1   HYDROcodone-acetaminophen (NORCO) 10-325 MG tablet Take 1 tablet by mouth every 8 (eight) hours. (Patient taking differently: Take 1 tablet by mouth every 6 (six) hours as needed for moderate pain (pain score 4-6).) 90 tablet 0   amoxicillin (AMOXIL) 500 MG capsule Take by mouth. (Patient not taking: Reported on  06/22/2023)     No current facility-administered medications on file prior to visit.    Allergies:  No Known Allergies  Physical Exam General: well developed, well nourished pleasant elderly Caucasian male, seated, in no evident distress Head: head normocephalic and atraumatic.  Neck: supple with no carotid or supraclavicular bruits Cardiovascular: regular rate and rhythm, no murmurs Musculoskeletal: no deformity Skin:  no rash/petichiae Vascular:  Normal pulses all extremities Vitals:   06/22/23 0853  BP: 134/84  Pulse: 66   Neurologic Exam Mental Status: Awake and fully alert. Oriented to place and time. Recent and remote memory intact. Attention span, concentration and fund of knowledge appropriate. Mood and affect appropriate.  Diminished recall 2/3.  Able to name 12 animals which can walk on 4 legs.  Clock drawing 4/4. Cranial Nerves: Fundoscopic exam reveals sharp disc margins. Pupils equal, briskly reactive to light. Extraocular movements full without nystagmus. Visual fields right partial homonymous hemianopsia and lower quadrant to confrontation. Hearing intact. Facial sensation intact. Face, tongue, palate moves normally and symmetrically.  Motor: Normal bulk and tone. Normal strength in all tested extremity muscles. Sensory.: intact to touch ,pinprick .position and vibratory sensation.  Coordination: Rapid alternating movements normal in all extremities. Finger-to-nose and heel-to-shin performed accurately bilaterally. Gait and Station: Arises from chair without difficulty. Stance is normal. Gait demonstrates normal stride length and balance . Able to heel, toe and tandem walk with mild difficulty.  Reflexes: 1+ and symmetric. Toes downgoing.   NIHSS  2 Modified Rankin  2   ASSESSMENT: 72 year old Caucasian male with left parieto-occipital parenchymal intracerebral hemorrhage in the setting of starting anticoagulation with Xarelto for acute PE at which time he also had  transient A-fib.  Patient is doing clinically quite well with only mild cognitive impairment and right-sided peripheral vision loss.  MRI of the brain shows multiple microhemorrhages raising concern for underlying mild amyloid angiopathy hence patient is not a good long-term anticoagulation candidate.     PLAN:I had a long discussion with the patient and Mark Cohen regarding Mark recent intracerebral hemorrhage related to anticoagulation for diagnosis of recent PE and discussed MRI findings which raise possibility of underlying mild cerebral amyloid angiopathy.  Clinically he is doing quite well with only mild cognitive impairment and partial peripheral vision deficit.  I agree patient is not a good long-term anticoagulation candidate due to increased risk of brain hemorrhage hence will hold off antiplatelet or anticoagulants at the present time.  If however he has recurrent strokes or TIAs due to history of transient A-fib in the setting of acute PE we may need to consider options like Watchman device or short-term anticoagulation in the neutral.  Maintain strict control of hypertension blood pressure goal below 130/90.  I encouraged him to increase participation in cognitively challenging activities like solving crossword puzzles, language and sudoku.  We also discussed memory compensation strategies.  Will follow-up in the future in 6 months or call earlier if necessary.  Greater than 50% of time during this 35 minute visit was spent on counseling,explanation of diagnosis intracerebral hemorrhage, possible amyloid angiopathy planning of further management, discussion with patient and family and coordination of care Mark Heady, MD Note: This document was prepared with digital dictation and possible smart phrase technology. Any transcriptional errors that result from this process are unintentional

## 2023-06-22 NOTE — Patient Instructions (Addendum)
I had a long discussion with the patient and his wife regarding his recent intracerebral hemorrhage related to anticoagulation for diagnosis of recent PE and discussed MRI findings which raise possibility of underlying mild cerebral amyloid angiopathy.  Clinically he is doing quite well with only mild cognitive impairment and partial peripheral vision deficit.  I agree patient is not a good long-term anticoagulation candidate due to increased risk of brain hemorrhage hence will hold off antiplatelet or anticoagulants at the present time.  If however he has recurrent strokes or TIAs due to history of transient A-fib in the setting of acute PE we may need to consider options like Watchman device or short-term anticoagulation in the neutral.  Maintain strict control of hypertension blood pressure goal below 130/90.  I encouraged him to increase participation in cognitively challenging activities like solving crossword puzzles, language and sudoku.  We also discussed memory compensation strategies.  Will follow-up in the future in 6 months or call earlier if necessary.

## 2023-07-20 DIAGNOSIS — I639 Cerebral infarction, unspecified: Secondary | ICD-10-CM | POA: Diagnosis not present

## 2023-07-20 DIAGNOSIS — H2512 Age-related nuclear cataract, left eye: Secondary | ICD-10-CM | POA: Diagnosis not present

## 2023-08-14 ENCOUNTER — Other Ambulatory Visit: Payer: Self-pay | Admitting: Internal Medicine

## 2023-08-14 DIAGNOSIS — M150 Primary generalized (osteo)arthritis: Secondary | ICD-10-CM

## 2023-08-14 DIAGNOSIS — M5416 Radiculopathy, lumbar region: Secondary | ICD-10-CM

## 2023-08-14 DIAGNOSIS — G8929 Other chronic pain: Secondary | ICD-10-CM

## 2023-08-14 MED ORDER — HYDROCODONE-ACETAMINOPHEN 10-325 MG PO TABS: 1.0000 | ORAL_TABLET | Freq: Four times a day (QID) | ORAL | 0 refills | Status: DC | PRN

## 2023-08-17 ENCOUNTER — Other Ambulatory Visit: Payer: Self-pay | Admitting: Interventional Radiology

## 2023-08-17 ENCOUNTER — Telehealth: Payer: Self-pay | Admitting: *Deleted

## 2023-08-17 DIAGNOSIS — I611 Nontraumatic intracerebral hemorrhage in hemisphere, cortical: Secondary | ICD-10-CM

## 2023-08-17 NOTE — Telephone Encounter (Signed)
  Called [name] to schedule for evaluation of IVC Filter removal s/p placement on 01/03/2023 with Dr. Adele. [name] states he wants to keep the IVC Filter due to the brain bleeds he has and he cannot take blood thinners. I told him I will forward msg to Dr. Adele if there are any questions I will call him back.  [name] was very nice and understanding./vm

## 2023-08-18 ENCOUNTER — Other Ambulatory Visit: Payer: Self-pay | Admitting: Interventional Radiology

## 2023-08-18 DIAGNOSIS — I611 Nontraumatic intracerebral hemorrhage in hemisphere, cortical: Secondary | ICD-10-CM

## 2023-08-23 ENCOUNTER — Ambulatory Visit
Admission: RE | Admit: 2023-08-23 | Discharge: 2023-08-23 | Disposition: A | Discharge status: Home / Self Care | Payer: HMO | Source: Ambulatory Visit | Attending: Interventional Radiology | Admitting: Interventional Radiology

## 2023-08-23 DIAGNOSIS — I611 Nontraumatic intracerebral hemorrhage in hemisphere, cortical: Secondary | ICD-10-CM

## 2023-08-23 DIAGNOSIS — Z86718 Personal history of other venous thrombosis and embolism: Secondary | ICD-10-CM | POA: Diagnosis not present

## 2023-08-29 ENCOUNTER — Ambulatory Visit
Admission: RE | Admit: 2023-08-29 | Discharge: 2023-08-29 | Disposition: A | Discharge status: Home / Self Care | Payer: Medicare HMO | Source: Ambulatory Visit | Attending: Radiology | Admitting: Radiology

## 2023-08-29 DIAGNOSIS — I611 Nontraumatic intracerebral hemorrhage in hemisphere, cortical: Secondary | ICD-10-CM

## 2023-08-29 NOTE — Progress Notes (Signed)
 Chief Complaint: Patient was consulted remotely today (TeleHealth) for IVC filter follow up  Referring Physician(s): Sanda Linger MD Donato Schultz MD  History of Present Illness: Mark Cohen is a 73 y.o. male who was diagnosed with small bilateral pulmonary emboli on 03/31/2023 and venous duplex demonstrated DVT in the right posterior tibial veins, right peroneal veins and left gastrocnemius veins on 04/01/2023.  Patient was also noted to have new onset of atrial fibrillation around the same time as the PE.  Patient was discharged on Xarelto.  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  Anticoagulation was stopped and an IVC filter was placed on 04/04/2023.  Currently, the patient is not on anticoagulation.  He denies chest pain or shortness of breath.  He complains of swelling in his left ankle and calf that worsens throughout the day.  He has not been wearing compression stockings because of skin irritation.  Patient saw his cardiologist on 06/06/2023 and he was in sinus rhythm at that time and no plans for anticoagulation.  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  Today's visit was to discuss IVC filter management.  Past Medical History:  Diagnosis Date   Allergic rhinitis    Atrial fibrillation (HCC)    Colon polyps    Epididymal cyst    right   GERD (gastroesophageal reflux disease)    Hepatitis 1961   hepatitis a as child, no liver oproblems since   Hip pain    both    Past Surgical History:  Procedure Laterality Date   APPENDECTOMY     colonscopy  07/27/2016   EPIDIDYMECTOMY  08/29/2002   Sees Dr Patsi Sears twice a year left side   EPIDIDYMECTOMY Right 09/09/2016   Procedure: EPIDIDYMECTOMY;  Surgeon: Jethro Bolus, MD;  Location: [facility];  Service: Urology;   Laterality: Right;   IR IVC FILTER PLMT / S&I /IMG GUID/MOD SED  04/04/2023   IR IVC FILTER RETRIEVAL / S&I /IMG GUID/MOD SED  04/04/2023   NASAL SINUS SURGERY  2008    Allergies: Patient has no known allergies.  Medications: Prior to Admission medications   Medication Sig Start Date End Date Taking? Authorizing Provider  ALPRAZolam (XANAX) 0.25 MG tablet Take 1 tablet (0.25 mg total) by mouth 2 (two) times daily as needed. Patient taking differently: Take 0.25 mg by mouth 2 (two) times daily as needed for anxiety. 03/30/23   Etta Grandchild, MD  amoxicillin (AMOXIL) 500 MG capsule Take by mouth. Patient not taking: Reported on 06/22/2023 05/25/23   [provider]  Cholecalciferol (DIALYVITE VITAMIN D 5000 PO) Take 1 tablet by mouth daily.    [provider]  diltiazem (CARDIZEM CD) 120 MG 24 hr capsule Take 1 capsule (120 mg total) by mouth daily. 03/29/23   Etta Grandchild, MD  esomeprazole (NEXIUM) 40 MG capsule TAKE 1 CAPSULE BY MOUTH DAILY 10/02/22   Etta Grandchild, MD  HYDROcodone-acetaminophen (NORCO) 10-325 MG tablet Take 1 tablet by mouth every 6 (six) hours as needed for moderate pain (pain score 4-6). 08/14/23   Etta Grandchild, MD     Family History  Problem Relation Age of Onset   Arthritis Unknown    Stroke Unknown    Cancer Neg Hx     Social History   Socioeconomic History   Marital status: Married  Spouse name: Not on file   Number of children: Not on file   Years of education: Not on file   Highest education level: Bachelor's degree (e.g., BA, AB, BS)  Occupational History   Occupation: Retired  Tobacco Use   Smoking status: Former    Current packs/day: 1.00    Average packs/day: 1 pack/day for 7.0 years (7.0 ttl pk-yrs)    Types: Cigarettes    Passive exposure: Never   Smokeless tobacco: Never  Substance and Sexual Activity   Alcohol use: Yes    Alcohol/week: 21.0 standard drinks of alcohol    Types: 21 Cans of beer per week   Drug  use: No   Sexual activity: Not Currently    Partners: Female  Other Topics Concern   Not on file  Social History Narrative   Regular Exercise -  YES         Social Drivers of Health   Financial Resource Strain: Low Risk  (03/28/2023)   Overall Financial Resource Strain (CARDIA)    Difficulty of Paying Living Expenses: Not hard at all  Food Insecurity: No Food Insecurity (04/07/2023)   Hunger Vital Sign    Worried About Running Out of Food in the Last Year: Never true    Ran Out of Food in the Last Year: Never true  Transportation Needs: No Transportation Needs (04/07/2023)   PRAPARE - Administrator, Civil Service (Medical): No    Lack of Transportation (Non-Medical): No  Physical Activity: Sufficiently Active (03/28/2023)   Exercise Vital Sign    Days of Exercise per Week: 5 days    Minutes of Exercise per Session: 60 min  Stress: No Stress Concern Present (03/28/2023)   [employer] of Occupational Health - Occupational Stress Questionnaire    Feeling of Stress : Not at all  Social Connections: Moderately Isolated (03/28/2023)   Social Connection and Isolation Panel [NHANES]    Frequency of Communication with Friends and Family: More than three times a week    Frequency of Social Gatherings with Friends and Family: Three times a week    Attends Religious Services: Never    Active Member of Clubs or Organizations: No    Attends Engineer, structural: Not on file    Marital Status: Married     Review of Systems  Constitutional: Negative.   Respiratory: Negative.    Cardiovascular:  Positive for leg swelling. Negative for chest pain.    Physical Exam No direct physical exam was performed  Vital Signs: There were no vitals taken for this visit.  Imaging: US Venous Img Lower Bilateral (DVT) Result Date: 08/24/2023 CLINICAL DATA:  History of DVT of right posterior tibial, right peroneal and left gastrocnemius veins and prior IVC filter placement on  04/04/2023. EXAM: BILATERAL LOWER EXTREMITY VENOUS DOPPLER ULTRASOUND TECHNIQUE: Gray-scale sonography with graded compression, as well as color Doppler and duplex ultrasound were performed to evaluate the lower extremity deep venous systems from the level of the common femoral vein and including the common femoral, femoral, profunda femoral, popliteal and calf veins including the posterior tibial, peroneal and gastrocnemius veins when visible. The superficial great saphenous vein was also interrogated. Spectral Doppler was utilized to evaluate flow at rest and with distal augmentation maneuvers in the common femoral, femoral and popliteal veins. COMPARISON:  Report from a [place] vascular lab study on 04/01/2023 FINDINGS: RIGHT LOWER EXTREMITY Common Femoral Vein: No evidence of thrombus. Normal compressibility, respiratory phasicity and response to augmentation.  Saphenofemoral Junction: No evidence of thrombus. Normal compressibility and flow on color Doppler imaging. Profunda Femoral Vein: No evidence of thrombus. Normal compressibility and flow on color Doppler imaging. Femoral Vein: No evidence of thrombus. Normal compressibility, respiratory phasicity and response to augmentation. Popliteal Vein: No evidence of thrombus. Normal compressibility, respiratory phasicity and response to augmentation. Calf Veins: No evidence of thrombus. Normal compressibility and flow on color Doppler imaging. Superficial Great Saphenous Vein: No evidence of thrombus. Normal compressibility. Venous Reflux:  None. Other Findings: No evidence of superficial thrombophlebitis or abnormal fluid collection. LEFT LOWER EXTREMITY Common Femoral Vein: No evidence of thrombus. Normal compressibility, respiratory phasicity and response to augmentation. Saphenofemoral Junction: No evidence of thrombus. Normal compressibility and flow on color Doppler imaging. Profunda Femoral Vein: No evidence of thrombus. Normal compressibility and flow on  color Doppler imaging. Femoral Vein: No evidence of thrombus. Normal compressibility, respiratory phasicity and response to augmentation. Popliteal Vein: No evidence of thrombus. Normal compressibility, respiratory phasicity and response to augmentation. Calf Veins: No evidence of thrombus. Normal compressibility and flow on color Doppler imaging. Superficial Great Saphenous Vein: No evidence of thrombus. Normal compressibility. Venous Reflux:  None. Other Findings: No evidence of superficial thrombophlebitis or abnormal fluid collection. IMPRESSION: No evidence of deep venous thrombosis in either lower extremity. Complete resolution of previously identified bilateral DVT. Electronically Signed   By: Irish Lack M.D.   On: 08/24/2023 09:47    Labs:  CBC: Recent Labs    03/31/23 1740 04/01/23 0201 04/03/23 1907 04/06/23 1026  WBC 7.2 6.3 7.2 7.4  HGB 12.8* 11.8* 13.7  13.3 14.1  HCT 38.5* 34.8* 40.4  39.0 40.3  PLT 353 290 324 272    COAGS: Recent Labs    03/31/23 1740 04/01/23 0201 04/03/23 1907  INR 1.9*  --  2.0*  APTT  --  76* 40*    BMP: Recent Labs    03/31/23 1050 03/31/23 1740 04/03/23 1907 04/06/23 1026  NA 138 137 138  139 135  K 4.2 4.0 4.0  3.8 4.1  CL 101 102 106  105 100  CO2 30 26 23 25   GLUCOSE 99 95 109*  102* 88  BUN 10 10 11  12 10   CALCIUM 9.6 9.2 9.1 9.1  CREATININE 0.84 0.89 0.89  0.80 0.87  GFRNONAA  --  >60 >60 >60    LIVER FUNCTION TESTS: Recent Labs    02/03/23 1140 04/03/23 1907  BILITOT 0.7 0.9  AST 19 26  ALT 14 16  ALKPHOS 79 76  PROT 6.7 6.8  ALBUMIN 4.6 3.7    TUMOR MARKERS: No results for input(s): "AFPTM", "CEA", "CA199", "CHROMGRNA" in the last 8760 hours.  Assessment and Plan:  73 year old with presumably unprovoked thromboembolic disease in September 8657.  Patient had small bilateral pulmonary emboli and bilateral calf DVT.  Patient developed intracranial hemorrhage after starting anticoagulation.  As a  result, IVC filter was placed on 04/04/2023.  Patient recently had a bilateral lower extremity venous duplex on 08/23/2023 that was negative for DVT.  Patient complains of left ankle and calf swelling.  We discussed IVC filter management.  It is not clear why the patient had lower extremity DVT or PE.  He reportedly had been traveling approximately 1 month before the episode which could have been related.  Currently the patient has no evidence of lower extremity DVT.  To my knowledge, the patient has not had a hypercoagulable workup.  We discussed the benefits and risks of keeping  an IVC filter long-term.  Explained that a chronic filter can have complications such as increasing the risk of thromboembolic disease in the abdomen or lower extremities and there is a risk of filter fracture or migration.  At this time, the patient feels better having the IVC filter since the lower extremity DVT was presumably unprovoked.  I explained that we could always revisit filter retrieval in the future and that he does not need to make a decision at this time.  Therefore, we will plan to keep the IVC filter unless the patient changes his mind.    We discussed the patient's left lower extremity swelling.  Patient denies prior surgery or injury in this area.  He says that the left ankle swelling started after the DVT and PE.  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  I explained the pathophysiology of this process with the patient and his wife.  I told him that we could evaluate him for superficial venous insufficiency but he denies any significant varicosities in the left lower extremity.  I recommend using lower extremity compression stockings at least knee-high.  I suggested he start with lower compression like 10 to 15 mmHg.  At this time, we will declare the IVC filter permanent with no active plans for retrieval.  If the patient changes his mind we can discuss filter retrieval in the future.  In  addition, if the patient's lower extremity symptoms worsen I told him I would be happy to evaluate him for venous insufficiency.    Electronically Signed: Arn Medal 08/29/2023, 12:05 PM   I spent a total of 30 minutes in remote  clinical consultation, greater than 50% of which was counseling/coordinating care for IVC filter management.    Visit type: Audio only (telephone). Audio (no video) only due to patient preference. Alternative for in-person consultation at [facility].  Patient ID: Mark Cohen, male   DOB: June 18, 1951, 73 y.o.   MRN: [id number]

## 2023-08-31 ENCOUNTER — Telehealth: Payer: Medicare HMO

## 2023-09-17 ENCOUNTER — Other Ambulatory Visit: Payer: Self-pay | Admitting: Internal Medicine

## 2023-09-17 DIAGNOSIS — I4891 Unspecified atrial fibrillation: Secondary | ICD-10-CM

## 2023-10-31 DIAGNOSIS — H43392 Other vitreous opacities, left eye: Secondary | ICD-10-CM | POA: Diagnosis not present

## 2023-10-31 DIAGNOSIS — H43812 Vitreous degeneration, left eye: Secondary | ICD-10-CM | POA: Diagnosis not present

## 2023-10-31 DIAGNOSIS — H26491 Other secondary cataract, right eye: Secondary | ICD-10-CM | POA: Diagnosis not present

## 2023-10-31 DIAGNOSIS — H31091 Other chorioretinal scars, right eye: Secondary | ICD-10-CM | POA: Diagnosis not present

## 2023-11-23 NOTE — Therapy (Signed)
 [facility] [address], [address] [place], [place], [address] Phone: [phone]   Fax:  [phone]  Patient Details  Name: Mark Cohen MRN: [id number] Date of Birth: 01-20-1951 Referring Provider:  Joann Mu., MD  Encounter Date: 11/23/2023  SPEECH THERAPY DISCHARGE SUMMARY  Visits from Start of Care: 2  Current functional level related to goals / functional outcomes: After evaluation and scheduling follow up appointments, pt called and cancelled all but the session on 05/16/23. He did not schedule further ST. Goals from his last attended session follow: SHORT TERM GOALS: Target date: 05/19/23   Pt will complete cognitive communication assessment and Cognition PROM in first therapy session; Goals added PRN Baseline: Goal status: INITIAL   2.  Joe will demo appropriate planning and organization in mod complex cognitive communication tasks in 3 sessions Baseline:  Goal status: INITIAL   3.  Pt will demo 95% error awareness after double checking functional detailed written and/or verbal tasks in 3 sessions Baseline:  Goal status: INITIAL   4.  Pt will demo strategies for error awareness in mod complex cognitive communication (cog-comm) tasks independently in 3 sessions Baseline:  Goal status: INITIAL   5.  Pt will demo strategies for planning and organization with rare min A independently in 3 sessions Baseline:  Goal status: INITIAL     LONG TERM GOALS: Target date: 06/17/23   Pt will improve PROM from initial administration Baseline:  Goal status: INITIAL   2.  Pt will demo strategies for planning and organization independently in mod complex/complex cog-comm tasks in 3 sessions Baseline:  Goal status: INITIAL   3.  Pt will demo strategies for error awareness in mod complex/complex cog-comm tasks independently in 3 sessions Baseline:  Goal status: INITIAL   4.  Joe will demo more WNL processing time with  /mod complex novel cog-comm tasks in 3 sessions.             Baseline:             Goal status: INITIAL    Remaining deficits: [place] as pt cancelled all appointments after 05/16/23.   Education / Equipment: See therapy notes.   Patient agrees to discharge. Patient goals were not met. Patient is being discharged due to not returning since the last visit.Aaron Aas    Pricsilla Lindvall, CCC-SLP 11/23/2023, 1:55 PM  [facility] [address] [place], [place], [address] Phone: [phone]   Fax:  [phone]

## 2023-11-27 DIAGNOSIS — H33312 Horseshoe tear of retina without detachment, left eye: Secondary | ICD-10-CM | POA: Diagnosis not present

## 2023-11-27 DIAGNOSIS — H43392 Other vitreous opacities, left eye: Secondary | ICD-10-CM | POA: Diagnosis not present

## 2023-11-30 ENCOUNTER — Telehealth: Payer: Self-pay | Admitting: Neurology

## 2023-11-30 NOTE — Telephone Encounter (Signed)
 LVM and sent mychart msg informing pt of need to reschedule 02/29/24 appt - MD out

## 2023-12-06 ENCOUNTER — Ambulatory Visit: Payer: HMO | Attending: Cardiology | Admitting: Cardiology

## 2023-12-06 ENCOUNTER — Encounter: Payer: Self-pay | Admitting: Cardiology

## 2023-12-06 VITALS — BP 146/83 | HR 76 | Ht 72.0 in

## 2023-12-06 DIAGNOSIS — I2699 Other pulmonary embolism without acute cor pulmonale: Secondary | ICD-10-CM | POA: Diagnosis not present

## 2023-12-06 DIAGNOSIS — E785 Hyperlipidemia, unspecified: Secondary | ICD-10-CM | POA: Diagnosis not present

## 2023-12-06 DIAGNOSIS — I480 Paroxysmal atrial fibrillation: Secondary | ICD-10-CM

## 2023-12-06 DIAGNOSIS — I10 Essential (primary) hypertension: Secondary | ICD-10-CM | POA: Diagnosis not present

## 2023-12-06 NOTE — Progress Notes (Signed)
 Cardiology Office Note:  .   Date:  12/06/2023  ID:  Mark Cohen, DOB [birth year], MRN [id number] PCP: Mark Knuckles, MD  [facility] Providers Cardiologist:  Mark Gathers, MD    History of Present Illness: .   Mark Cohen is a 73 y.o. male Discussed the use of AI scribe software for clinical note transcription with the patient, who gave verbal consent to proceed.  History of Present Illness Mark Cohen "Mark Cohen" is a 73 year old male with atrial fibrillation and a history of pulmonary embolism who presents for follow-up regarding his IVC filter.  He has a history of atrial fibrillation, previously paroxysmal, in the context of a prior pulmonary embolism. He continues to take diltiazem  for rate control, maintaining sinus rhythm, and reports no recent episodes of atrial fibrillation. He engages in significant physical activity.  An IVC filter was placed following an acute pulmonary embolism. There is ongoing consideration of its removal. No recurrent blood clots/thrombosis have been reported, and a recent ultrasound of his legs showed no evidence of thrombosis.  He experienced an intracranial hemorrhage in the left parietal region after starting Xarelto , which was reversed with Andexxa . The cause of the hemorrhage is uncertain, with possibilities including amyloid angiopathy or primary hemorrhage. He is currently avoiding anticoagulation.  He takes diltiazem  daily in the morning, which he finds effective for managing his blood pressure and heart rate.  He has not experienced symptoms suggestive of recurrent blood clots, such as redness or pain in the legs.    Studies Reviewed: .        Results RADIOLOGY Head CT: Intracranial hemorrhage, left parietal region (2024) Head CT: No new findings (2024) Lower extremity ultrasound: No evidence of deep vein thrombosis (2024) Risk Assessment/Calculations:                Physical Exam:   VS:  BP (!) 146/83   Pulse  76   Ht 6' (1.829 m)   SpO2 98%   BMI 22.24 kg/m    Wt Readings from Last 3 Encounters:  06/22/23 164 lb (74.4 kg)  06/06/23 163 lb (73.9 kg)  04/17/23 160 lb (72.6 kg)    GEN: Well nourished, well developed in no acute distress NECK: No JVD; No carotid bruits CARDIAC: RRR, no murmurs, no rubs, no gallops RESPIRATORY:  Clear to auscultation without rales, wheezing or rhonchi  ABDOMEN: Soft, non-tender, non-distended EXTREMITIES:  No edema; No deformity   ASSESSMENT AND PLAN: .    Assessment and Plan Assessment & Plan Atrial Fibrillation Paroxysmal atrial fibrillation in the context of pulmonary embolism. Maintained sinus rhythm with diltiazem  for rate control. No recent episodes of atrial fibrillation. - Continue diltiazem .  Pulmonary Embolism No evidence of recurrent thromboembolism. Managed with IVC filter placement due to contraindication for anticoagulation following intracranial hemorrhage.  IVC Filter IVC filter placed due to pulmonary embolism and contraindication for anticoagulation. Advised removal due to absence of recurrent thromboembolism and potential for thrombus formation on the filter. Recent ultrasound showed no evidence of thrombus. - Contact vascular or interventional radiologist to discuss removal of IVC filter. Agree with removal.  Intracranial Hemorrhage Intracranial hemorrhage in the left parietal region post-Xarelto  initiation, reversed with Andexxa . Suspected amyloid angiopathy or primary hemorrhage. Avoiding anticoagulation due to risk of recurrence.         Dispo: Will Graduate from CV clinic.  Please reach out if any further assistance is necessary.  Okay to continue with diltiazem .  No further evidence  of A-fib.  Signed, Mark Gathers, MD

## 2023-12-06 NOTE — Patient Instructions (Signed)
 Medication Instructions:  The current medical regimen is effective;  continue present plan and medications.  *If you need a refill on your cardiac medications before your next appointment, please call your pharmacy*  Follow-Up: At [facility], you and your health needs are our priority.  As part of our continuing mission to provide you with exceptional heart care, our providers are all part of one team.  This team includes your primary Cardiologist (physician) and Advanced Practice Providers or APPs (Physician Assistants and Nurse Practitioners) who all work together to provide you with the care you need, when you need it.  Your next appointment:   Follow up as needed with Dr Anne Fu  We recommend signing up for the patient portal called "MyChart".  Sign up information is provided on this After Visit Summary.  MyChart is used to connect with patients for Virtual Visits (Telemedicine).  Patients are able to view lab/test results, encounter notes, upcoming appointments, etc.  Non-urgent messages can be sent to your provider as well.   To learn more about what you can do with MyChart, go to [email or web address].

## 2023-12-15 DIAGNOSIS — H26491 Other secondary cataract, right eye: Secondary | ICD-10-CM | POA: Diagnosis not present

## 2023-12-29 DIAGNOSIS — H43392 Other vitreous opacities, left eye: Secondary | ICD-10-CM | POA: Diagnosis not present

## 2023-12-29 DIAGNOSIS — Z9889 Other specified postprocedural states: Secondary | ICD-10-CM | POA: Diagnosis not present

## 2024-01-01 ENCOUNTER — Other Ambulatory Visit: Payer: Self-pay | Admitting: Internal Medicine

## 2024-01-01 DIAGNOSIS — F411 Generalized anxiety disorder: Secondary | ICD-10-CM

## 2024-01-10 ENCOUNTER — Encounter: Payer: Self-pay | Admitting: Internal Medicine

## 2024-01-10 ENCOUNTER — Other Ambulatory Visit: Payer: Self-pay | Admitting: Internal Medicine

## 2024-01-10 DIAGNOSIS — M150 Primary generalized (osteo)arthritis: Secondary | ICD-10-CM

## 2024-01-10 DIAGNOSIS — M5416 Radiculopathy, lumbar region: Secondary | ICD-10-CM

## 2024-01-10 DIAGNOSIS — G8929 Other chronic pain: Secondary | ICD-10-CM

## 2024-01-10 MED ORDER — HYDROCODONE-ACETAMINOPHEN 10-325 MG PO TABS: 1.0000 | ORAL_TABLET | Freq: Four times a day (QID) | ORAL | 0 refills | Status: DC | PRN

## 2024-01-14 ENCOUNTER — Encounter (HOSPITAL_COMMUNITY): Payer: Self-pay | Admitting: Emergency Medicine

## 2024-01-14 ENCOUNTER — Emergency Department (HOSPITAL_COMMUNITY)
Admission: EM | Admit: 2024-01-14 | Discharge: 2024-01-14 | Disposition: A | Discharge status: Home / Self Care | Attending: Emergency Medicine | Admitting: Emergency Medicine

## 2024-01-14 ENCOUNTER — Emergency Department (HOSPITAL_COMMUNITY)

## 2024-01-14 ENCOUNTER — Other Ambulatory Visit: Payer: Self-pay

## 2024-01-14 DIAGNOSIS — G459 Transient cerebral ischemic attack, unspecified: Secondary | ICD-10-CM | POA: Diagnosis not present

## 2024-01-14 DIAGNOSIS — W19XXXA Unspecified fall, initial encounter: Secondary | ICD-10-CM | POA: Diagnosis not present

## 2024-01-14 DIAGNOSIS — R29818 Other symptoms and signs involving the nervous system: Secondary | ICD-10-CM | POA: Diagnosis not present

## 2024-01-14 DIAGNOSIS — Z7901 Long term (current) use of anticoagulants: Secondary | ICD-10-CM | POA: Diagnosis not present

## 2024-01-14 DIAGNOSIS — S199XXA Unspecified injury of neck, initial encounter: Secondary | ICD-10-CM | POA: Diagnosis not present

## 2024-01-14 DIAGNOSIS — R569 Unspecified convulsions: Secondary | ICD-10-CM | POA: Diagnosis not present

## 2024-01-14 DIAGNOSIS — M47812 Spondylosis without myelopathy or radiculopathy, cervical region: Secondary | ICD-10-CM | POA: Diagnosis not present

## 2024-01-14 DIAGNOSIS — I6782 Cerebral ischemia: Secondary | ICD-10-CM | POA: Diagnosis not present

## 2024-01-14 DIAGNOSIS — R29810 Facial weakness: Secondary | ICD-10-CM | POA: Diagnosis not present

## 2024-01-14 DIAGNOSIS — D72829 Elevated white blood cell count, unspecified: Secondary | ICD-10-CM | POA: Diagnosis not present

## 2024-01-14 DIAGNOSIS — G40901 Epilepsy, unspecified, not intractable, with status epilepticus: Secondary | ICD-10-CM | POA: Diagnosis not present

## 2024-01-14 LAB — COMPREHENSIVE METABOLIC PANEL WITH GFR
ALT: 19 U/L (ref 0–44)
AST: 36 U/L (ref 15–41)
Albumin: 4.3 g/dL (ref 3.5–5.0)
Alkaline Phosphatase: 86 U/L (ref 38–126)
Anion gap: 27 — ABNORMAL HIGH (ref 5–15)
BUN: 12 mg/dL (ref 8–23)
CO2: 11 mmol/L — ABNORMAL LOW (ref 22–32)
Calcium: 9.4 mg/dL (ref 8.9–10.3)
Chloride: 102 mmol/L (ref 98–111)
Creatinine, Ser: 1.15 mg/dL (ref 0.61–1.24)
GFR, Estimated: >60 mL/min (ref >60)
Glucose, Bld: 176 mg/dL — ABNORMAL HIGH (ref 70–99)
Potassium: 3.7 mmol/L (ref 3.5–5.1)
Sodium: 140 mmol/L (ref 135–145)
Total Bilirubin: 1 mg/dL (ref 0.0–1.2)
Total Protein: 7 g/dL (ref 6.5–8.1)

## 2024-01-14 LAB — I-STAT CHEM 8, ED
BUN: 11 mg/dL (ref 8–23)
Calcium, Ion: 1.16 mmol/L (ref 1.15–1.40)
Chloride: 108 mmol/L (ref 98–111)
Creatinine, Ser: 0.9 mg/dL (ref 0.61–1.24)
Glucose, Bld: 170 mg/dL — ABNORMAL HIGH (ref 70–99)
HCT: 46 % (ref 39.0–52.0)
Hemoglobin: 15.6 g/dL (ref 13.0–17.0)
Potassium: 3.8 mmol/L (ref 3.5–5.1)
Sodium: 139 mmol/L (ref 135–145)
TCO2: 12 mmol/L — ABNORMAL LOW (ref 22–32)

## 2024-01-14 LAB — URINALYSIS, ROUTINE W REFLEX MICROSCOPIC
Bacteria, UA: NONE SEEN
Bilirubin Urine: NEGATIVE
Glucose, UA: NEGATIVE mg/dL
Hgb urine dipstick: NEGATIVE
Ketones, ur: NEGATIVE mg/dL
Leukocytes,Ua: NEGATIVE
Nitrite: NEGATIVE
Protein, ur: 100 mg/dL — AB
Specific Gravity, Urine: 1.012 (ref 1.005–1.030)
pH: 5 (ref 5.0–8.0)

## 2024-01-14 LAB — DIFFERENTIAL
Abs Immature Granulocytes: 0.04 K/uL (ref 0.00–0.07)
Basophils Absolute: 0.1 K/uL (ref 0.0–0.1)
Basophils Relative: 0 %
Eosinophils Absolute: 0.2 K/uL (ref 0.0–0.5)
Eosinophils Relative: 2 %
Immature Granulocytes: 0 %
Lymphocytes Relative: 31 %
Lymphs Abs: 3.5 K/uL (ref 0.7–4.0)
Monocytes Absolute: 0.7 K/uL (ref 0.1–1.0)
Monocytes Relative: 6 %
Neutro Abs: 6.8 K/uL (ref 1.7–7.7)
Neutrophils Relative %: 61 %

## 2024-01-14 LAB — CBC
HCT: 45.5 % (ref 39.0–52.0)
Hemoglobin: 15.6 g/dL (ref 13.0–17.0)
MCH: 32.6 pg (ref 26.0–34.0)
MCHC: 34.3 g/dL (ref 30.0–36.0)
MCV: 95 fL (ref 80.0–100.0)
Platelets: 236 K/uL (ref 150–400)
RBC: 4.79 MIL/uL (ref 4.22–5.81)
RDW: 12.5 % (ref 11.5–15.5)
WBC: 11.3 K/uL — ABNORMAL HIGH (ref 4.0–10.5)
nRBC: 0 % (ref 0.0–0.2)

## 2024-01-14 LAB — PROTIME-INR
INR: 1.1 (ref 0.8–1.2)
Prothrombin Time: 15.2 s (ref 11.4–15.2)

## 2024-01-14 LAB — ETHANOL: Alcohol, Ethyl (B): <15 mg/dL (ref <15)

## 2024-01-14 LAB — CBG MONITORING, ED: Glucose-Capillary: 158 mg/dL — ABNORMAL HIGH (ref 70–99)

## 2024-01-14 LAB — APTT: aPTT: 26 s (ref 24–36)

## 2024-01-14 MED ORDER — SODIUM CHLORIDE 0.9% FLUSH
3.0000 mL | Freq: Once | INTRAVENOUS | Status: AC
  Administered 2024-01-14: 3 mL via INTRAVENOUS

## 2024-01-14 MED ORDER — LEVETIRACETAM 500 MG PO TABS: 500.0000 mg | ORAL_TABLET | Freq: Two times a day (BID) | ORAL | 1 refills | Status: DC

## 2024-01-14 MED ORDER — LEVETIRACETAM (KEPPRA) 500 MG/5 ML ADULT IV PUSH
2000.0000 mg | Freq: Once | INTRAVENOUS | Status: AC
  Administered 2024-01-14: 2000 mg via INTRAVENOUS

## 2024-01-14 MED ORDER — LEVETIRACETAM (KEPPRA) 500 MG/5 ML ADULT IV PUSH: 1000.0000 mg | Freq: Once | INTRAVENOUS | Status: DC

## 2024-01-14 NOTE — ED Notes (Signed)
 EEG at bedside.

## 2024-01-14 NOTE — Progress Notes (Signed)
 EEG complete - results pending

## 2024-01-14 NOTE — ED Notes (Signed)
 CCMD called and notified

## 2024-01-14 NOTE — ED Provider Notes (Signed)
 [place] EMERGENCY DEPARTMENT AT [facility] Provider Note   CSN: [id number] Arrival date & time: 01/14/24  1205  An emergency department physician performed an initial assessment on this suspected stroke patient at 1203.  Patient presents with: Code Stroke   Mark Cohen is a 73 y.o. male.   HPI   Patient has a history of acid reflux atrial fibrillation, IVC placement, cerebral hemorrhage, pulmonary embolism.  Patient presented to the ED for altered mental status weakness possible seizures.  Pt was unloading groceries from the car today.  Found to have seizure activity and fell to the ground.  EMS noted weakness and gaze deviation.  Pt had a second witnessed seizure.  Code stroke activated by EMS.  Pt not able to answer questions on arrival  Prior to Admission medications   Medication Sig Start Date End Date Taking? Authorizing Provider  ALPRAZolam  (XANAX ) 0.25 MG tablet Take 1 tablet (0.25 mg total) by mouth 2 (two) times daily as needed for anxiety. 01/06/24   Joshua Debby CROME, MD  amoxicillin  (AMOXIL ) 500 MG capsule Take by mouth. Patient not taking: Reported on 12/06/2023 05/25/23   [provider]  Cholecalciferol  (DIALYVITE VITAMIN D  5000 PO) Take 1 tablet by mouth daily.    [provider]  diltiazem  (CARDIZEM  CD) 120 MG 24 hr capsule TAKE 1 CAPSULE BY MOUTH DAILY 09/18/23   Joshua Debby CROME, MD  esomeprazole  (NEXIUM ) 40 MG capsule TAKE 1 CAPSULE BY MOUTH DAILY 10/02/22   Joshua Debby CROME, MD  HYDROcodone -acetaminophen  (NORCO) 10-325 MG tablet Take 1 tablet by mouth every 6 (six) hours as needed for moderate pain (pain score 4-6). 01/10/24   Joshua Debby CROME, MD  levETIRAcetam  (KEPPRA ) 500 MG tablet Take 1 tablet (500 mg total) by mouth 2 (two) times daily. 01/14/24   Randol Simmonds, MD    Allergies: Patient has no known allergies.    Review of Systems  Updated Vital Signs BP 118/75   Pulse 65   Temp 98 F (36.7 C) (Axillary)   Resp 16   Ht 1.829 m (6')    Wt 77 kg   SpO2 100%   BMI 23.02 kg/m   Physical Exam Vitals and nursing note reviewed.  Constitutional:      Appearance: He is well-developed. He is not diaphoretic.  HENT:     Head: Normocephalic and atraumatic.     Right Ear: External ear normal.     Left Ear: External ear normal.  Eyes:     General: No scleral icterus.       Right eye: No discharge.        Left eye: No discharge.     Conjunctiva/sclera: Conjunctivae normal.  Neck:     Trachea: No tracheal deviation.  Cardiovascular:     Rate and Rhythm: Normal rate and regular rhythm.  Pulmonary:     Effort: Pulmonary effort is normal. No respiratory distress.     Breath sounds: Normal breath sounds. No stridor. No wheezing or rales.  Abdominal:     General: Bowel sounds are normal. There is no distension.     Palpations: Abdomen is soft.     Tenderness: There is no abdominal tenderness. There is no guarding or rebound.  Musculoskeletal:        General: No tenderness or deformity.     Cervical back: Neck supple.  Skin:    General: Skin is warm and dry.     Findings: No rash.  Neurological:  General: No focal deficit present.     Motor: No seizure activity.     Comments: ?facial droop, pt non verbal, not following commands, moving both lower extremities, weakness, ?right facial droop  Psychiatric:        Mood and Affect: Mood normal.     (all labs ordered are listed, but only abnormal results are displayed) Labs Reviewed  CBC - Abnormal; Notable for the following components:      Result Value   WBC 11.3 (*)    All other components within normal limits  COMPREHENSIVE METABOLIC PANEL WITH GFR - Abnormal; Notable for the following components:   CO2 11 (*)    Glucose, Bld 176 (*)    Anion gap 27 (*)    All other components within normal limits  URINALYSIS, ROUTINE W REFLEX MICROSCOPIC - Abnormal; Notable for the following components:   Protein, ur 100 (*)    All other components within normal limits   I-STAT CHEM 8, ED - Abnormal; Notable for the following components:   Glucose, Bld 170 (*)    TCO2 12 (*)    All other components within normal limits  CBG MONITORING, ED - Abnormal; Notable for the following components:   Glucose-Capillary 158 (*)    All other components within normal limits  PROTIME-INR  APTT  DIFFERENTIAL  ETHANOL    EKG: None  Radiology: CT Cervical Spine Wo Contrast Result Date: 01/14/2024 CLINICAL DATA:  Neck trauma (Age >= 65y) EXAM: CT CERVICAL SPINE WITHOUT CONTRAST TECHNIQUE: Multidetector CT imaging of the cervical spine was performed without intravenous contrast. Multiplanar CT image reconstructions were also generated. RADIATION DOSE REDUCTION: This exam was performed according to the departmental dose-optimization program which includes automated exposure control, adjustment of the mA and/or kV according to patient size and/or use of iterative reconstruction technique. COMPARISON:  None Available. FINDINGS: Alignment: Normal. Skull base and vertebrae: C5-C7 moderate degenerative changes of the spine. No associated severe osseous neural foraminal stenosis. No acute fracture. No aggressive appearing focal osseous lesion or focal pathologic process. Soft tissues and spinal canal: No prevertebral fluid or swelling. No visible canal hematoma. Upper chest: Biapical pleural/pulmonary scarring. Other: None. IMPRESSION: No acute displaced fracture or traumatic listhesis of the cervical spine. Hi, Electronically Signed   By: Morgane  Naveau M.D.   On: 01/14/2024 15:01   CT HEAD CODE STROKE WO CONTRAST Result Date: 01/14/2024 CLINICAL DATA:  Code stroke. Neuro deficit, concern for stroke. Seizure. EXAM: CT HEAD WITHOUT CONTRAST TECHNIQUE: Contiguous axial images were obtained from the base of the skull through the vertex without intravenous contrast. RADIATION DOSE REDUCTION: This exam was performed according to the departmental dose-optimization program which includes  automated exposure control, adjustment of the mA and/or kV according to patient size and/or use of iterative reconstruction technique. COMPARISON:  MRI head 06/04/2023.  CT head 04/05/2023. FINDINGS: Brain: No acute intracranial hemorrhage. Encephalomalacia in the left parieto-occipital lobes at the site of prior hemorrhage. Nonspecific hypoattenuation in the periventricular and subcortical white matter favored to reflect chronic microvascular ischemic changes. No CT evidence of new large territory infarct. No edema, mass effect, or midline shift. Ventricles: The ventricles are normal. Vascular: No hyperdense vessel or unexpected calcification. Skull: No acute or aggressive finding. Orbits: Right lens replacement.  Orbits are otherwise unremarkable. Sinuses: Mild mucosal thickening in the ethmoid sinuses. Postsurgical changes of the maxillary sinuses. Other: Mastoid air cells are clear. ASPECTS [facility] Stroke Program Early CT Score) - Ganglionic level infarction (caudate, lentiform nuclei, internal  capsule, insula, M1-M3 cortex): 7 - Supraganglionic infarction (M4-M6 cortex): 3 Total score (0-10 with 10 being normal): 10 IMPRESSION: 1. No CT evidence of acute intracranial abnormality. 2. Encephalomalacia in the left parieto-occipital lobes at the site of prior hemorrhage. 3. Chronic microvascular ischemic changes. 4. ASPECTS is 10 These results were communicated to Dr. Jerri at 12:25 pm on 01/14/2024 by text page via the [facility] messaging system. Electronically Signed   By: Donnice Mania M.D.   On: 01/14/2024 12:26     .Critical Care  Performed by: Randol Simmonds, MD Authorized by: Randol Simmonds, MD   Critical care provider statement:    Critical care time (minutes):  30   Critical care was time spent personally by me on the following activities:  Development of treatment plan with patient or surrogate, discussions with consultants, evaluation of patient's response to treatment, examination of patient, ordering and  review of laboratory studies, ordering and review of radiographic studies, ordering and performing treatments and interventions, pulse oximetry, re-evaluation of patient's condition and review of old charts    Medications Ordered in the ED  sodium chloride  flush (NS) 0.9 % injection 3 mL (3 mLs Intravenous Given 01/14/24 1226)  levETIRAcetam  (KEPPRA ) undiluted injection 2,000 mg (2,000 mg Intravenous Given 01/14/24 1225)    Clinical Course as of 01/14/24 1547  Sun Jan 14, 2024  1226 EKG normal sinus rhythm rate 73. [JK]  1330 Comprehensive metabolic panel(!) Metabolic panel notable for decreased bicarb.  CBC shows slight leukocytosis. [JK]  1330 Head CT does not show any signs of acute hemorrhage [JK]  1434 Patient is alert and awake.  He is back to baseline.  Discussed options of staying in the hospital for further evaluation and treatment.  Patient really would prefer to go home as he has a doctor's appointment tomorrow.  We will get CT scan of his C-spine and reassess [JK]  1504 CT scan without acute findings [JK]    Clinical Course User Index [JK] Randol Simmonds, MD                                 Medical Decision Making Amount and/or Complexity of Data Reviewed Labs: ordered. Decision-making details documented in ED Course. Radiology: ordered.  Risk Prescription drug management.   Patient presented to the ED for altered mental status seizure possible stroke.  Patient was seen by Dr. Michaela in the stroke team on arrival.  Patient's imaging test did not show any signs of cerebral hemorrhage.  No acute abnormality.  Labs did show decreased bicarb consistent with acute seizure.  Patient was treated with Keppra  in the ED.  He was also given a dose of Ativan.  Patient's mental status has returned to normal.  Postictal state has resolved.  Findings were discussed with the patient and his wife.  Admission to the hospital for overnight observation was offered.  Patient however does want to  go home.  He has an appointment tomorrow that he wants to go to.  Patient was able to ambulate around the ED without difficulty.  Patient was given a prescription for Keppra .  Also instructed him to not drive until cleared by neurology.  Evaluation and diagnostic testing in the emergency department does not suggest an emergent condition requiring admission or immediate intervention beyond what has been performed at this time.  The patient is safe for discharge and has been instructed to return immediately for worsening symptoms, change  in symptoms or any other concerns.      Final diagnoses:  Seizure [facility])    ED Discharge Orders          Ordered    levETIRAcetam  (KEPPRA ) 500 MG tablet  2 times daily,   Status:  Discontinued        01/14/24 1536    levETIRAcetam  (KEPPRA ) 500 MG tablet  2 times daily        01/14/24 1543    Ambulatory referral to Neurology       Comments: An appointment is requested in approximately: 2 weeks   01/14/24 1546               Randol Simmonds, MD 01/14/24 [phone]

## 2024-01-14 NOTE — Code Documentation (Addendum)
 Stroke Response Nurse Documentation Code Documentation  RAWN QUIROA is a 73 y.o. male arriving to [facility] via [facility] as Code Stroke activation. LKW 1100 when he then began to have right sided seizure activity as he was bringing in groceries from the car. With EMS, right gaze and right weakness as well as able to answer simple yes/no questions. Patient had another seizure with EMS and was given 5mg  versed .   Stroke team met patient at the bridge, labs drawn, airway cleared by EDP Randol, pt taken to CT. CT completed. Pt restless, some delay due to needing to restrap pt a few times. No TNK as pt on Eliquis. No LVO. Care Plan: Keppra  2g IV given. EEG. NIH and vitals q2h x12h then per unit routine. Bedside handoff with ED RN Izetta.    Tonna Lacks K  Rapid Response RN

## 2024-01-14 NOTE — ED Notes (Signed)
 Patient ambulated in room and hallway with steady gait.

## 2024-01-14 NOTE — Consult Note (Signed)
 NEUROLOGY CONSULT NOTE   Date of service: January 14, 2024 Patient Name: Mark Cohen MRN:  [id number] DOB:  1951-07-10 Chief Complaint: Seizure Requesting Provider: Randol Simmonds, MD  History of Present Illness  Mark Cohen is a 73 y.o. male with hx of previous left cerebral intra-axial hemorrhage who presents with new onset seizures.  He was in his normal state of health at 61 when he began having seizure activity on his right side.  EMS was called, and on their arrival he was saying yes/no, and then had a recurrent seizure with right-sided gaze.  Due to right-sided weakness following the seizure a code stroke was activated.  On arrival, he did still have confusion and right-sided weakness, appeared postictal but steadily improved  LKW: 11 AM IV Thrombolysis: No, anticoagulation  NIHSS components Score: Comment  1a Level of Conscious 0[]  1[x]  2[]  3[]      1b LOC Questions 0[]  1[]  2[x]       1c LOC Commands 0[]  1[]  2[x]       2 Best Gaze 0[]  1[x]  2[]       3 Visual 0[]  1[]  2[x]  3[]      4 Facial Palsy 0[]  1[x]  2[]  3[]      5a Motor Arm - left 0[]  1[]  2[]  3[]  4[]  UN[]    5b Motor Arm - Right 0[]  1[]  2[]  3[x]  4[]  UN[]    6a Motor Leg - Left 0[]  1[]  2[]  3[]  4[]  UN[]    6b Motor Leg - Right 0[x]  1[]  2[]  3[]  4[]  UN[]    7 Limb Ataxia 0[x]  1[]  2[]  UN[]      8 Sensory 0[]  1[x]  2[]  UN[]      9 Best Language 0[]  1[]  2[]  3[x]      10 Dysarthria 0[]  1[]  2[x]  UN[]      11 Extinct. and Inattention 0[]  1[x]  2[]       TOTAL: 19      Past History   Past Medical History:  Diagnosis Date   Allergic rhinitis    Atrial fibrillation (HCC)    Colon polyps    Epididymal cyst    right   GERD (gastroesophageal reflux disease)    Hepatitis 1961   hepatitis a as child, no liver oproblems since   Hip pain    both    Past Surgical History:  Procedure Laterality Date   APPENDECTOMY     colonscopy  07/27/2016   EPIDIDYMECTOMY  08/29/2002   Sees Dr Chales twice a year left side   EPIDIDYMECTOMY  Right 09/09/2016   Procedure: EPIDIDYMECTOMY;  Surgeon: Arlena Chales, MD;  Location: [facility];  Service: Urology;  Laterality: Right;   IR IVC FILTER PLMT / S&I /IMG GUID/MOD SED  04/04/2023   IR IVC FILTER RETRIEVAL / S&I /IMG GUID/MOD SED  04/04/2023   NASAL SINUS SURGERY  2008    Family History: Family History  Problem Relation Age of Onset   Arthritis Unknown    Stroke Unknown    Cancer Neg Hx     Social History  reports that he has quit smoking. His smoking use included cigarettes. He has a 7 pack-year smoking history. He has never been exposed to tobacco smoke. He has never used smokeless tobacco. He reports current alcohol use of about 21.0 standard drinks of alcohol per week. He reports that he does not use drugs.  No Known Allergies  Medications   Current Facility-Administered Medications:    sodium chloride  flush (NS) 0.9 % injection 3 mL, 3 mL, Intravenous, Once, Randol Simmonds, MD  Current Outpatient  Medications:    ALPRAZolam  (XANAX ) 0.25 MG tablet, Take 1 tablet (0.25 mg total) by mouth 2 (two) times daily as needed for anxiety., Disp: 60 tablet, Rfl: 1   amoxicillin  (AMOXIL ) 500 MG capsule, Take by mouth. (Patient not taking: Reported on 12/06/2023), Disp: , Rfl:    Cholecalciferol  (DIALYVITE VITAMIN D  5000 PO), Take 1 tablet by mouth daily., Disp: , Rfl:    diltiazem  (CARDIZEM  CD) 120 MG 24 hr capsule, TAKE 1 CAPSULE BY MOUTH DAILY, Disp: 90 capsule, Rfl: 1   esomeprazole  (NEXIUM ) 40 MG capsule, TAKE 1 CAPSULE BY MOUTH DAILY, Disp: 90 capsule, Rfl: 1   HYDROcodone -acetaminophen  (NORCO) 10-325 MG tablet, Take 1 tablet by mouth every 6 (six) hours as needed for moderate pain (pain score 4-6)., Disp: 120 tablet, Rfl: 0  Vitals   Vitals:   01-25-2024 1208  Weight: 77 kg    Body mass index is 23.02 kg/m.   Physical Exam   Constitutional: Appears well-developed and well-nourished.   Neurologic Examination    Neuro: Mental Status: Patient is having a CT scan  lethargic and confused, postictal appearing.  He does not follow commands. Cranial Nerves: II: Does not blink to threat from either direction. Pupils are equal, round, and reactive to light.   III,IV, VI: Does not clearly cross midline to the right.  V: Facial sensation is symmetric to temperature VII: Facial movement is weak and right VIII: hearing is intact to voice X: Uvula elevates symmetrically XII: tongue is midline without atrophy or fasciculations.  Motor: He initially has very little movement in his right upper extremity, but this improves rapidly over the course of our examination. Sensory: He responds less in the right than the left to noxious stimulation Cerebellar: Does not perform        Labs/Imaging/Neurodiagnostic studies   CBC:  Recent Labs  Lab Jan 25, 2024 1206 01-25-24 1210  WBC 11.3*  --   NEUTROABS 6.8  --   HGB 15.6 15.6  HCT 45.5 46.0  MCV 95.0  --   PLT 236  --    Basic Metabolic Panel:  Lab Results  Component Value Date   NA 139 01/25/24   K 3.8 25-Jan-2024   CO2 25 04/06/2023   GLUCOSE 170 (H) 01/25/24   BUN 11 01/25/24   CREATININE 0.90 01-25-2024   CALCIUM  9.1 04/06/2023   GFRNONAA >60 04/06/2023   GFRAA >60 09/08/2017   Lipid Panel:  Lab Results  Component Value Date   LDLCALC 68 02/03/2023   HgbA1c:  Lab Results  Component Value Date   HGBA1C 5.7 (H) 04/03/2023   Urine Drug Screen:     Component Value Date/Time   LABOPIA NONE DETECTED 04/03/2023 2002   COCAINSCRNUR NONE DETECTED 04/03/2023 2002   LABBENZ POSITIVE (A) 04/03/2023 2002   AMPHETMU NONE DETECTED 04/03/2023 2002   THCU POSITIVE (A) 04/03/2023 2002   LABBARB NONE DETECTED 04/03/2023 2002    Alcohol Level     Component Value Date/Time   ETH <10 04/03/2023 1907   INR  Lab Results  Component Value Date   INR 2.0 (H) 04/03/2023   APTT  Lab Results  Component Value Date   APTT 40 (H) 04/03/2023   AED levels: No results found for: PHENYTOIN,  ZONISAMIDE, LAMOTRIGINE, LEVETIRACETA  CT Head without contrast(Personally reviewed): Negative for acute findings, he has an old cortical stroke.   ASSESSMENT   Mark Cohen is a 73 y.o. male with new onset seizures without clear inciting event.  He  has approximately 8 months after the hemorrhagic stroke, I suspect that this is the etiology of the seizure.  No clear findings on chemistry or labs for inciting event.  Nothing based on history that is clear for provoking event.  He will need to be maintained on antiepileptic therapy moving forward, and he has been loaded with Keppra .  Following the exam noted above, he had rapid improvement and return to baseline.  I discussed with him and his wife my recommendations for starting antiepileptic therapy.  With him return to baseline, did not have any other acute recommendations and therefore he can follow-up with outpatient neurology at his next physical appointment.  RECOMMENDATIONS  Keppra  500 mg twice daily Initially recommended labs and CT which are negative at the time of finalizing this note. Follow-up as previously scheduled with neurology I discussed that he is not allowed to drive by law for period of at least 6 months from his most recent seizure ______________________________________________________________________    Signed, Aisha Seals, MD [facility]

## 2024-01-14 NOTE — ED Triage Notes (Signed)
 Per [facility] pt coming from home- wife called out after patient was bringing in groceries when he began having seizure like activity and fell. Patient was post ictal on ems arrival. Patient had another seizure in truck around 11:15 and 5 mg versed  IM administered. Ems report left facial droop and right sided gaze. Patient extremely restless and not following commands.

## 2024-01-14 NOTE — Discharge Instructions (Addendum)
 Start taking your seizure medication this evening.  You will need to take the medications twice daily indefinitely.  Follow-up with the neurologist for further evaluation.  You should not drive until a neurologist clears you to resume driving.  Typically this is after you have been seizure-free for 6 months.

## 2024-01-15 NOTE — Procedures (Signed)
 Patient Name: ROAN MIKLOS  MRN: [id number]  Epilepsy Attending: Arlin MALVA Krebs  Referring Physician/Provider: Michaela Aisha SQUIBB, MD  Date: 01/14/2024 Duration: 22.38 mins  Patient history: 73 y.o. male with hx of previous left cerebral intra-axial hemorrhage who presents with new onset seizures. EEG to evaluate for seizure.  Level of alertness: Awake  AEDs during EEG study: LEV  Technical aspects: This EEG study was done with scalp electrodes positioned according to the 10-20 International system of electrode placement. Electrical activity was reviewed with band pass filter of 1-70Hz , sensitivity of 7 uV/mm, display speed of 102mm/sec with a 60Hz  notched filter applied as appropriate. EEG data were recorded continuously and digitally stored.  Video monitoring was available and reviewed as appropriate.  Description: The posterior dominant rhythm consists of 8Hz  activity of moderate voltage (25-35 uV) seen predominantly in posterior head regions, symmetric and reactive to eye opening and eye closing. EEG showed continuous 3 to 6 Hz theta-delta slowing in left temporoparietal region. Hyperventilation and photic stimulation were not performed.     ABNORMALITY - Continuous slow, left temporoparietal region  IMPRESSION: This study is suggestive of cortical dysfunction arising from left temporoparietal region likely secondary to underlying encephalomalacia. No seizures or epileptiform discharges were seen throughout the recording.  Chuck Caban O Daray Polgar

## 2024-02-05 ENCOUNTER — Ambulatory Visit: Payer: Medicare HMO

## 2024-02-05 DIAGNOSIS — H26491 Other secondary cataract, right eye: Secondary | ICD-10-CM | POA: Diagnosis not present

## 2024-02-06 ENCOUNTER — Ambulatory Visit: Payer: Self-pay | Admitting: Internal Medicine

## 2024-02-06 ENCOUNTER — Ambulatory Visit (INDEPENDENT_AMBULATORY_CARE_PROVIDER_SITE_OTHER): Admitting: Internal Medicine

## 2024-02-06 ENCOUNTER — Encounter: Payer: Self-pay | Admitting: Internal Medicine

## 2024-02-06 ENCOUNTER — Ambulatory Visit

## 2024-02-06 VITALS — BP 130/72 | HR 86 | Temp 98.5°F | Ht 72.0 in | Wt 163.6 lb

## 2024-02-06 DIAGNOSIS — N400 Enlarged prostate without lower urinary tract symptoms: Secondary | ICD-10-CM

## 2024-02-06 DIAGNOSIS — R739 Hyperglycemia, unspecified: Secondary | ICD-10-CM | POA: Diagnosis not present

## 2024-02-06 DIAGNOSIS — M20011 Mallet finger of right finger(s): Secondary | ICD-10-CM | POA: Insufficient documentation

## 2024-02-06 DIAGNOSIS — R0609 Other forms of dyspnea: Secondary | ICD-10-CM | POA: Diagnosis not present

## 2024-02-06 DIAGNOSIS — I10 Essential (primary) hypertension: Secondary | ICD-10-CM | POA: Diagnosis not present

## 2024-02-06 DIAGNOSIS — E785 Hyperlipidemia, unspecified: Secondary | ICD-10-CM | POA: Diagnosis not present

## 2024-02-06 DIAGNOSIS — I700 Atherosclerosis of aorta: Secondary | ICD-10-CM | POA: Insufficient documentation

## 2024-02-06 DIAGNOSIS — Z0000 Encounter for general adult medical examination without abnormal findings: Secondary | ICD-10-CM

## 2024-02-06 DIAGNOSIS — K5730 Diverticulosis of large intestine without perforation or abscess without bleeding: Secondary | ICD-10-CM | POA: Insufficient documentation

## 2024-02-06 DIAGNOSIS — K2930 Chronic superficial gastritis without bleeding: Secondary | ICD-10-CM | POA: Insufficient documentation

## 2024-02-06 DIAGNOSIS — Z0001 Encounter for general adult medical examination with abnormal findings: Secondary | ICD-10-CM | POA: Insufficient documentation

## 2024-02-06 DIAGNOSIS — Z23 Encounter for immunization: Secondary | ICD-10-CM

## 2024-02-06 LAB — TSH: TSH: 1.46 u[IU]/mL (ref 0.35–5.50)

## 2024-02-06 LAB — HEMOGLOBIN A1C: Hgb A1c MFr Bld: 5.7 % (ref 4.6–6.5)

## 2024-02-06 LAB — CBC WITH DIFFERENTIAL/PLATELET
Basophils Absolute: 0 K/uL (ref 0.0–0.1)
Basophils Relative: 0.3 % (ref 0.0–3.0)
Eosinophils Absolute: 0.1 K/uL (ref 0.0–0.7)
Eosinophils Relative: 1.4 % (ref 0.0–5.0)
HCT: 44.6 % (ref 39.0–52.0)
Hemoglobin: 15.5 g/dL (ref 13.0–17.0)
Lymphocytes Relative: 20.8 % (ref 12.0–46.0)
Lymphs Abs: 1.4 K/uL (ref 0.7–4.0)
MCHC: 34.8 g/dL (ref 30.0–36.0)
MCV: 89.6 fl (ref 78.0–100.0)
Monocytes Absolute: 0.3 K/uL (ref 0.1–1.0)
Monocytes Relative: 5.2 % (ref 3.0–12.0)
Neutro Abs: 4.7 K/uL (ref 1.4–7.7)
Neutrophils Relative %: 72.3 % (ref 43.0–77.0)
Platelets: 211 K/uL (ref 150.0–400.0)
RBC: 4.97 Mil/uL (ref 4.22–5.81)
RDW: 12.7 % (ref 11.5–15.5)
WBC: 6.5 K/uL (ref 4.0–10.5)

## 2024-02-06 LAB — PSA: PSA: 0.13 ng/mL (ref 0.10–4.00)

## 2024-02-06 LAB — TROPONIN I (HIGH SENSITIVITY): High Sens Troponin I: 4 ng/L (ref 2–17)

## 2024-02-06 MED ORDER — SHINGRIX 50 MCG/0.5ML IM SUSR
0.5000 mL | Freq: Once | INTRAMUSCULAR | 1 refills | Status: AC
Start: 2024-02-06 — End: 2024-02-06

## 2024-02-06 NOTE — Progress Notes (Signed)
 Subjective:  Patient ID: Mark Cohen, male    DOB: 1950-12-07  Age: 73 y.o. MRN: [id number]  CC: Annual Exam (Patient has a seizure 01/14/2024 patient was seen in the ER. Patient had a stroke last year. Patient wants to discuss possibly removing his IVC filter.  ) and Hyperlipidemia   HPI Mark Cohen presents for a CPX and f/up -----  Discussed the use of AI scribe software for clinical note transcription with the patient, who gave verbal consent to proceed.  History of Present Illness Mark Cohen is a 73 year old male with seizures and atrial fibrillation who presents with concerns about recent seizures and medication management. He is accompanied by his wife.  On July 6th, he experienced a seizure after returning from grocery shopping. He parked his truck, started his wife's car, and upon reaching the end of the driveway, he could not remember how to turn the car off. He walked into the house, looked at his wife, and then had a seizure. His wife noted that he stared blankly before falling to the floor and having a full-blown seizure without losing consciousness. He reportedly had another seizure in the ambulance on the way to the hospital but has not experienced any further seizures since being discharged.  He is currently on Keppra , taken twice daily at 10 AM and 10 PM. He feels 'woozy' if he misses a dose or takes it late, describing the sensation as 'waves' that come and go. He has not experienced any further seizures since starting Keppra .  He denies a history of atrial fibrillation and reports no irregular heartbeats since a previous check. He uses a personal device to monitor his heart rhythm daily. 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  He experienced a stroke in the past, which affected his peripheral vision on the right side. He has undergone a retina procedure in the left eye and a laser procedure to remove scar tissue from a previous  cataract surgery, resulting in improved vision.  He reports shortness of breath, which sometimes occurs with exertion and sometimes he feels it may be related to stress. He describes himself as someone who pushes himself to complete tasks, which sometimes leads to feeling 'uptight' and short of breath. No chest pain but notes feeling 'wobbly and wonky,' requiring his wife to drive him. He has not driven since the seizure.    Outpatient Medications Prior to Visit  Medication Sig Dispense Refill   ALPRAZolam  (XANAX ) 0.25 MG tablet Take 1 tablet (0.25 mg total) by mouth 2 (two) times daily as needed for anxiety. 60 tablet 1   Cholecalciferol  (DIALYVITE VITAMIN D  5000 PO) Take 1 tablet by mouth daily.     diltiazem  (CARDIZEM  CD) 120 MG 24 hr capsule TAKE 1 CAPSULE BY MOUTH DAILY 90 capsule 1   esomeprazole  (NEXIUM ) 40 MG capsule TAKE 1 CAPSULE BY MOUTH DAILY (Patient taking differently: Take 20 mg by mouth daily.) 90 capsule 1   HYDROcodone -acetaminophen  (NORCO) 10-325 MG tablet Take 1 tablet by mouth every 6 (six) hours as needed for moderate pain (pain score 4-6). 120 tablet 0   levETIRAcetam  (KEPPRA ) 500 MG tablet Take 1 tablet (500 mg total) by mouth 2 (two) times daily. 60 tablet 1   amoxicillin  (AMOXIL ) 500 MG capsule Take by mouth. (Patient not taking: Reported on 12/06/2023)     No facility-administered medications prior to visit.    ROS Review of Systems  Constitutional:  Negative  for appetite change, chills, diaphoresis, fatigue and fever.  HENT: Negative.    Eyes:  Negative for visual disturbance.  Respiratory:  Positive for shortness of breath (DOE). Negative for cough, chest tightness, wheezing and stridor.   Cardiovascular:  Negative for chest pain, palpitations and leg swelling.  Gastrointestinal: Negative.  Negative for abdominal pain, blood in stool, constipation, diarrhea, nausea and vomiting.  Genitourinary: Negative.  Negative for difficulty urinating.  Musculoskeletal:   Positive for arthralgias. Negative for back pain and myalgias.       Deformity right ring finger after recent seizure  Skin: Negative.   Neurological:  Positive for dizziness and light-headedness. Negative for weakness.  Hematological:  Negative for adenopathy. Does not bruise/bleed easily.  Psychiatric/Behavioral: Negative.      Objective:  BP 130/72 (BP Location: Right Arm, Patient Position: Sitting, Cuff Size: Normal)   Pulse 86   Temp 98.5 F (36.9 C) (Oral)   Ht 6' (1.829 m)   Wt 163 lb 9.6 oz (74.2 kg)   SpO2 98%   BMI 22.19 kg/m   BP Readings from Last 3 Encounters:  02/06/24 130/72  01/14/24 118/75  12/06/23 (!) 146/83    Wt Readings from Last 3 Encounters:  02/06/24 163 lb 9.6 oz (74.2 kg)  01/14/24 169 lb 12.1 oz (77 kg)  06/22/23 164 lb (74.4 kg)    Physical Exam Vitals reviewed.  Constitutional:      Appearance: Normal appearance.  HENT:     Mouth/Throat:     Mouth: Mucous membranes are moist.  Eyes:     General: No scleral icterus.    Conjunctiva/sclera: Conjunctivae normal.  Cardiovascular:     Rate and Rhythm: Normal rate and regular rhythm.     Heart sounds: Normal heart sounds, S1 normal and S2 normal. No murmur heard.    No friction rub. No gallop.     Comments: EKG -- NSR, 71 bpm No LVH, Q waves, or ST/T wave changes  Pulmonary:     Breath sounds: No stridor. No wheezing, rhonchi or rales.  Abdominal:     Palpations: There is no mass.     Tenderness: There is no abdominal tenderness. There is no guarding.     Hernia: No hernia is present.  Musculoskeletal:     Cervical back: Neck supple.     Right lower leg: No edema.     Left lower leg: No edema.     Comments: Mallet deformity right ring finger  Skin:    General: Skin is warm and dry.  Neurological:     General: No focal deficit present.     Mental Status: He is alert. Mental status is at baseline.  Psychiatric:        Mood and Affect: Mood normal.        Behavior: Behavior normal.      Lab Results  Component Value Date   WBC 6.5 02/06/2024   HGB 15.5 02/06/2024   HCT 44.6 02/06/2024   PLT 211.0 02/06/2024   GLUCOSE 170 (H) 01/14/2024   CHOL 166 02/03/2023   TRIG 47.0 02/03/2023   HDL 88.10 02/03/2023   LDLCALC 68 02/03/2023   ALT 19 01/14/2024   AST 36 01/14/2024   NA 139 01/14/2024   K 3.8 01/14/2024   CL 108 01/14/2024   CREATININE 0.90 01/14/2024   BUN 11 01/14/2024   CO2 11 (L) 01/14/2024   TSH 1.46 02/06/2024   PSA 0.13 02/06/2024   INR 1.1 01/14/2024   HGBA1C  5.7 02/06/2024    EEG adult Result Date: 01/14/2024 Shelton Arlin KIDD, MD     01/15/2024  8:27 AM Patient Name: Mark Cohen MRN: [id number] Epilepsy Attending: Arlin KIDD Shelton Referring Physician/Provider: Michaela Aisha SQUIBB, MD Date: 01/14/2024 Duration: 22.38 mins Patient history: 73 y.o. male with hx of previous left cerebral intra-axial hemorrhage who presents with new onset seizures. EEG to evaluate for seizure. Level of alertness: Awake AEDs during EEG study: LEV Technical aspects: This EEG study was done with scalp electrodes positioned according to the 10-20 International system of electrode placement. Electrical activity was reviewed with band pass filter of 1-70Hz , sensitivity of 7 uV/mm, display speed of 64mm/sec with a 60Hz  notched filter applied as appropriate. EEG data were recorded continuously and digitally stored.  Video monitoring was available and reviewed as appropriate. Description: The posterior dominant rhythm consists of 8Hz  activity of moderate voltage (25-35 uV) seen predominantly in posterior head regions, symmetric and reactive to eye opening and eye closing. EEG showed continuous 3 to 6 Hz theta-delta slowing in left temporoparietal region. Hyperventilation and photic stimulation were not performed.   ABNORMALITY - Continuous slow, left temporoparietal region IMPRESSION: This study is suggestive of cortical dysfunction arising from left temporoparietal region likely  secondary to underlying encephalomalacia. No seizures or epileptiform discharges were seen throughout the recording. Arlin KIDD Shelton   CT Cervical Spine Wo Contrast Result Date: 01/14/2024 CLINICAL DATA:  Neck trauma (Age >= 65y) EXAM: CT CERVICAL SPINE WITHOUT CONTRAST TECHNIQUE: Multidetector CT imaging of the cervical spine was performed without intravenous contrast. Multiplanar CT image reconstructions were also generated. RADIATION DOSE REDUCTION: This exam was performed according to the departmental dose-optimization program which includes automated exposure control, adjustment of the mA and/or kV according to patient size and/or use of iterative reconstruction technique. COMPARISON:  None Available. FINDINGS: Alignment: Normal. Skull base and vertebrae: C5-C7 moderate degenerative changes of the spine. No associated severe osseous neural foraminal stenosis. No acute fracture. No aggressive appearing focal osseous lesion or focal pathologic process. Soft tissues and spinal canal: No prevertebral fluid or swelling. No visible canal hematoma. Upper chest: Biapical pleural/pulmonary scarring. Other: None. IMPRESSION: No acute displaced fracture or traumatic listhesis of the cervical spine. Hi, Electronically Signed   By: Morgane  Naveau M.D.   On: 01/14/2024 15:01   CT HEAD CODE STROKE WO CONTRAST Result Date: 01/14/2024 CLINICAL DATA:  Code stroke. Neuro deficit, concern for stroke. Seizure. EXAM: CT HEAD WITHOUT CONTRAST TECHNIQUE: Contiguous axial images were obtained from the base of the skull through the vertex without intravenous contrast. RADIATION DOSE REDUCTION: This exam was performed according to the departmental dose-optimization program which includes automated exposure control, adjustment of the mA and/or kV according to patient size and/or use of iterative reconstruction technique. COMPARISON:  MRI head 06/04/2023.  CT head 04/05/2023. FINDINGS: Brain: No acute intracranial hemorrhage.  Encephalomalacia in the left parieto-occipital lobes at the site of prior hemorrhage. Nonspecific hypoattenuation in the periventricular and subcortical white matter favored to reflect chronic microvascular ischemic changes. No CT evidence of new large territory infarct. No edema, mass effect, or midline shift. Ventricles: The ventricles are normal. Vascular: No hyperdense vessel or unexpected calcification. Skull: No acute or aggressive finding. Orbits: Right lens replacement.  Orbits are otherwise unremarkable. Sinuses: Mild mucosal thickening in the ethmoid sinuses. Postsurgical changes of the maxillary sinuses. Other: Mastoid air cells are clear. ASPECTS [facility] Stroke Program Early CT Score) - Ganglionic level infarction (caudate, lentiform nuclei, internal capsule, insula, M1-M3 cortex): 7 -  Supraganglionic infarction (M4-M6 cortex): 3 Total score (0-10 with 10 being normal): 10 IMPRESSION: 1. No CT evidence of acute intracranial abnormality. 2. Encephalomalacia in the left parieto-occipital lobes at the site of prior hemorrhage. 3. Chronic microvascular ischemic changes. 4. ASPECTS is 10 These results were communicated to Dr. Jerri at 12:25 pm on 01/14/2024 by text page via the [facility] messaging system. Electronically Signed   By: Donnice Mania M.D.   On: 01/14/2024 12:26    Assessment & Plan:  Benign prostatic hyperplasia without lower urinary tract symptoms -     PSA; Future  Encounter for general adult medical examination with abnormal findings- Exam completed, labs reviewed, vaccines reviewed and updated, cancer screenings addressed, pt ed material was given.   Hyperlipidemia with target LDL less than 130 -     TSH; Future  Essential hypertension, benign- BP is well controlled. -     TSH; Future -     CBC with Differential/Platelet; Future  DOE (dyspnea on exertion)- EKG is normal. -     EKG 12-Lead -     Troponin I (High Sensitivity); Future -     CT CORONARY MORPH W/CTA COR W/SCORE W/CA  W/CM &/OR WO/CM; Future  Atherosclerosis of aorta [facility])- He is not willing to take a statin.  Mallet finger of right hand -     Ambulatory referral to Orthopedic Surgery  Chronic hyperglycemia -     Hemoglobin A1c; Future     Follow-up: Return in about 6 months (around 08/08/2024).  Debby Molt, MD

## 2024-02-06 NOTE — Patient Instructions (Signed)
 Health Maintenance, Male  Adopting a healthy lifestyle and getting preventive care are important in promoting health and wellness. Ask your health care provider about:  The right schedule for you to have regular tests and exams.  Things you can do on your own to prevent diseases and keep yourself healthy.  What should I know about diet, weight, and exercise?  Eat a healthy diet    Eat a diet that includes plenty of vegetables, fruits, low-fat dairy products, and lean protein.  Do not eat a lot of foods that are high in solid fats, added sugars, or sodium.  Maintain a healthy weight  Body mass index (BMI) is a measurement that can be used to identify possible weight problems. It estimates body fat based on height and weight. Your health care provider can help determine your BMI and help you achieve or maintain a healthy weight.  Get regular exercise  Get regular exercise. This is one of the most important things you can do for your health. Most adults should:  Exercise for at least 150 minutes each week. The exercise should increase your heart rate and make you sweat (moderate-intensity exercise).  Do strengthening exercises at least twice a week. This is in addition to the moderate-intensity exercise.  Spend less time sitting. Even light physical activity can be beneficial.  Watch cholesterol and blood lipids  Have your blood tested for lipids and cholesterol at 74 years of age, then have this test every 5 years.  You may need to have your cholesterol levels checked more often if:  Your lipid or cholesterol levels are high.  You are older than 74 years of age.  You are at high risk for heart disease.  What should I know about cancer screening?  Many types of cancers can be detected early and may often be prevented. Depending on your health history and family history, you may need to have cancer screening at various ages. This may include screening for:  Colorectal cancer.  Prostate cancer.  Skin cancer.  Lung  cancer.  What should I know about heart disease, diabetes, and high blood pressure?  Blood pressure and heart disease  High blood pressure causes heart disease and increases the risk of stroke. This is more likely to develop in people who have high blood pressure readings or are overweight.  Talk with your health care provider about your target blood pressure readings.  Have your blood pressure checked:  Every 3-5 years if you are 9-95 years of age.  Every year if you are 85 years old or older.  If you are between the ages of 29 and 29 and are a current or former smoker, ask your health care provider if you should have a one-time screening for abdominal aortic aneurysm (AAA).  Diabetes  Have regular diabetes screenings. This checks your fasting blood sugar level. Have the screening done:  Once every three years after age 23 if you are at a normal weight and have a low risk for diabetes.  More often and at a younger age if you are overweight or have a high risk for diabetes.  What should I know about preventing infection?  Hepatitis B  If you have a higher risk for hepatitis B, you should be screened for this virus. Talk with your health care provider to find out if you are at risk for hepatitis B infection.  Hepatitis C  Blood testing is recommended for:  Everyone born from [birth year] through 1966.  Anyone  with known risk factors for hepatitis C.  Sexually transmitted infections (STIs)  You should be screened each year for STIs, including gonorrhea and chlamydia, if:  You are sexually active and are younger than 74 years of age.  You are older than 74 years of age and your health care provider tells you that you are at risk for this type of infection.  Your sexual activity has changed since you were last screened, and you are at increased risk for chlamydia or gonorrhea. Ask your health care provider if you are at risk.  Ask your health care provider about whether you are at high risk for HIV. Your health care provider  may recommend a prescription medicine to help prevent HIV infection. If you choose to take medicine to prevent HIV, you should first get tested for HIV. You should then be tested every 3 months for as long as you are taking the medicine.  Follow these instructions at home:  Alcohol use  Do not drink alcohol if your health care provider tells you not to drink.  If you drink alcohol:  Limit how much you have to 0-2 drinks a day.  Know how much alcohol is in your drink. In the [place] one drink equals one 12 oz bottle of beer (355 mL), one 5 oz glass of wine (148 mL), or one 1 oz glass of hard liquor (44 mL).  Lifestyle  Do not use any products that contain nicotine or tobacco. These products include cigarettes, chewing tobacco, and vaping devices, such as e-cigarettes. If you need help quitting, ask your health care provider.  Do not use street drugs.  Do not share needles.  Ask your health care provider for help if you need support or information about quitting drugs.  General instructions  Schedule regular health, dental, and eye exams.  Stay current with your vaccines.  Tell your health care provider if:  You often feel depressed.  You have ever been abused or do not feel safe at home.  Summary  Adopting a healthy lifestyle and getting preventive care are important in promoting health and wellness.  Follow your health care provider's instructions about healthy diet, exercising, and getting tested or screened for diseases.  Follow your health care provider's instructions on monitoring your cholesterol and blood pressure.  This information is not intended to replace advice given to you by your health care provider. Make sure you discuss any questions you have with your health care provider.  Document Revised: 11/16/2020 Document Reviewed: 11/16/2020  Elsevier Patient Education  2024 [employer].

## 2024-02-14 ENCOUNTER — Ambulatory Visit: Admitting: Neurology

## 2024-02-14 ENCOUNTER — Encounter: Payer: Self-pay | Admitting: Neurology

## 2024-02-14 VITALS — BP 117/73 | HR 69 | Ht 72.0 in | Wt 167.8 lb

## 2024-02-14 DIAGNOSIS — G40009 Localization-related (focal) (partial) idiopathic epilepsy and epileptic syndromes with seizures of localized onset, not intractable, without status epilepticus: Secondary | ICD-10-CM

## 2024-02-14 DIAGNOSIS — H53461 Homonymous bilateral field defects, right side: Secondary | ICD-10-CM

## 2024-02-14 MED ORDER — LEVETIRACETAM 500 MG PO TABS: 500.0000 mg | ORAL_TABLET | Freq: Two times a day (BID) | ORAL | 5 refills | Status: DC

## 2024-02-14 NOTE — Progress Notes (Addendum)
 [facility] [address] [place]. [place] [address] [phone]       OFFICE FOLLOW-UP NOTE  [name] Date of Birth:  Dec 26, 1950 Medical Record Number:  [id number]   HPI: Initial visit 06/22/2023 Mr. Callender is a 73 year old pleasant Caucasian male seen today for initial office follow-up visit following hospital admission for intracerebral hemorrhage.  He is accompanied by his wife.  History is obtained from them and review of electronic medical records.  I personally reviewed pertinent available imaging films in PACS.  He has past medical history significant for gastroparesis residual reflux disease, hepatitis, colonic polyps, recent diagnosis of pulmonary embolism with transient A-fib for which she was started on Xarelto  only 2 days prior to admission..  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  Patient presented on 04/03/2023 when he developed sudden onset of difficulty with texting.  He tripped over his granddaughter and was clearly having trouble with his vision.  Family called EMS and and CT head on arrival showed a left parieto-occipital convexity parenchymal hemorrhage measuring 5.3 x 4 x 3 cm estimated volume of 32 mL.  ICH score was 1.  Patient blood pressure was tightly controlled.  He was on Xarelto  which was reversed using Andexxa .Patient had a recent pulm embolism he had an IVC filter placed some difficulty . He states he has done well since discharge.  He has not had any recurrence of his A-fib or any stroke or TIA symptoms.  Still has some right-sided peripheral lower vision difficulties.  He is yet been able to drive.  He has seen Dr. Jeffrie his cardiologist just recommended rate control medications and no need for anticoagulation.  On inquiry he admits to some slight memory difficulties but is still quite independent in managing his own affairs.  He did have a follow-up MRI scan on 06/04/2023 which showed expected evolutionary changes with reduction in the size of  the left parietal occipital convexity parenchymal hematoma now 4.4 in the long axis compared to 5.3 cm previously with regression of the surrounding edema.  There are extensive chronic blood products noted raising concern for amyloid angiopathy. Update 02/14/2024 : He returns for follow-up after last visit with me 8 months ago.  He is accompanied by his wife.  Patient was seen in the ER on 01/14/2024 with an episode of confusion and blank look on his face followed by weakness generalized tonic-clonic seizure by his wife at home.  EMS was called and while riding the ambulance patient had a second episode of witnessed generalized seizure.  He was loaded with IV Keppra .  CT scan of the head was obtained which showed no acute abnormalities and showed encephalomalacia in the left temporoparietal region from his previous hemorrhage.  EEG showed left temporoparietal slowing but no seizure activity.  Patient was started on Keppra  500 mg twice daily and discharged home.  He states he was quite sleepy for few days but then gradually got better.  He is tolerating Keppra  fairly well without any side effects and has not had any breakthrough seizures.  He continues to have mild short-term memory difficulties but these are unchanged.  He occasionally uses wrong words and struggles to find words but this is not getting worse.  He is not driving but otherwise is quite independent with activities of daily living.  He has no new complaints today.  He has not had any recurrent stroke or TIA symptoms. ROS:   14 system review of systems is positive for vision difficulties, bruising,  memory difficulties all other systems negative  PMH:  Past Medical History:  Diagnosis Date   Allergic rhinitis    Atrial fibrillation (HCC)    Colon polyps    Epididymal cyst    right   GERD (gastroesophageal reflux disease)    Hepatitis 1961   hepatitis a as child, no liver oproblems since   Hip pain    both    Social History:  Social  History   Socioeconomic History   Marital status: Married    Spouse name: Not on file   Number of children: Not on file   Years of education: Not on file   Highest education level: Bachelor's degree (e.g., BA, AB, BS)  Occupational History   Occupation: Retired  Tobacco Use   Smoking status: Former    Current packs/day: 1.00    Average packs/day: 1 pack/day for 7.0 years (7.0 ttl pk-yrs)    Types: Cigarettes    Passive exposure: Never   Smokeless tobacco: Never  Substance and Sexual Activity   Alcohol use: Yes    Alcohol/week: 21.0 standard drinks of alcohol    Types: 21 Cans of beer per week   Drug use: No   Sexual activity: Not Currently    Partners: Female  Other Topics Concern   Not on file  Social History Narrative   Regular Exercise -  YES         Social Drivers of Health   Financial Resource Strain: Patient Declined (02/05/2024)   Overall Financial Resource Strain (CARDIA)    Difficulty of Paying Living Expenses: Patient declined  Food Insecurity: No Food Insecurity (02/05/2024)   Hunger Vital Sign    Worried About Running Out of Food in the Last Year: Never true    Ran Out of Food in the Last Year: Never true  Transportation Needs: No Transportation Needs (02/05/2024)   PRAPARE - Administrator, Civil Service (Medical): No    Lack of Transportation (Non-Medical): No  Physical Activity: Unknown (02/05/2024)   Exercise Vital Sign    Days of Exercise per Week: Patient declined    Minutes of Exercise per Session: Not on file  Stress: Patient Declined (02/05/2024)   [employer] of Occupational Health - Occupational Stress Questionnaire    Feeling of Stress: Patient declined  Social Connections: Unknown (02/05/2024)   Social Connection and Isolation Panel    Frequency of Communication with Friends and Family: Patient declined    Frequency of Social Gatherings with Friends and Family: Patient declined    Attends Religious Services: Patient declined     Database administrator or Organizations: No    Attends Engineer, structural: Not on file    Marital Status: Married  Catering manager Violence: Not At Risk (04/05/2023)   Humiliation, Afraid, Rape, and Kick questionnaire    Fear of Current or Ex-Partner: No    Emotionally Abused: No    Physically Abused: No    Sexually Abused: No    Medications:   Current Outpatient Medications on File Prior to Visit  Medication Sig Dispense Refill   ALPRAZolam  (XANAX ) 0.25 MG tablet Take 1 tablet (0.25 mg total) by mouth 2 (two) times daily as needed for anxiety. 60 tablet 1   Cholecalciferol  (DIALYVITE VITAMIN D  5000 PO) Take 1 tablet by mouth daily.     diltiazem  (CARDIZEM  CD) 120 MG 24 hr capsule TAKE 1 CAPSULE BY MOUTH DAILY 90 capsule 1   esomeprazole  (NEXIUM ) 40 MG capsule TAKE  1 CAPSULE BY MOUTH DAILY (Patient taking differently: Take 20 mg by mouth daily.) 90 capsule 1   HYDROcodone -acetaminophen  (NORCO) 10-325 MG tablet Take 1 tablet by mouth every 6 (six) hours as needed for moderate pain (pain score 4-6). 120 tablet 0   levETIRAcetam  (KEPPRA ) 500 MG tablet Take 1 tablet (500 mg total) by mouth 2 (two) times daily. 60 tablet 1   No current facility-administered medications on file prior to visit.    Allergies:  No Known Allergies  Physical Exam General: well developed, well nourished pleasant elderly Caucasian male, seated, in no evident distress Head: head normocephalic and atraumatic.  Neck: supple with no carotid or supraclavicular bruits Cardiovascular: regular rate and rhythm, no murmurs Musculoskeletal: no deformity Skin:  no rash/petichiae Vascular:  Normal pulses all extremities Vitals:   02/14/24 1326  BP: 117/73  Pulse: 69  SpO2: 98%   Neurologic Exam Mental Status: Awake and fully alert. Oriented to place and time. Recent and remote memory intact. Attention span, concentration and fund of knowledge appropriate. Mood and affect appropriate.   recall 3/3.  Able  to name 14 animals which can walk on 4 legs.  Clock drawing 4/4.MMSE 30/30 Cranial Nerves: Fundoscopic exam reveals sharp disc margins. Pupils equal, briskly reactive to light. Extraocular movements full without nystagmus. Visual fields right partial homonymous hemianopsia and lower quadrant to confrontation. Hearing intact. Facial sensation intact. Face, tongue, palate moves normally and symmetrically.  Motor: Normal bulk and tone. Normal strength in all tested extremity muscles. Sensory.: intact to touch ,pinprick .position and vibratory sensation.  Coordination: Rapid alternating movements normal in all extremities. Finger-to-nose and heel-to-shin performed accurately bilaterally. Gait and Station: Arises from chair without difficulty. Stance is normal. Gait demonstrates normal stride length and balance . Able to heel, toe and tandem walk with mild difficulty.  Reflexes: 1+ and symmetric. Toes downgoing.   NIHSS  2 Modified Rankin  2    02/14/2024    1:51 PM  MMSE - Mini Mental State Exam  Orientation to time 5  Orientation to Place 5  Registration 3  Attention/ Calculation 5  Recall 3  Language- name 2 objects 2  Language- repeat 1  Language- follow 3 step command 3  Language- read & follow direction 1  Write a sentence 1  Copy design 1  Total score 30     ASSESSMENT: 73 year old Caucasian male with left parieto-occipital parenchymal intracerebral hemorrhage in the setting of starting anticoagulation with Xarelto  for acute PE at which time he also had transient A-fib.  Patient is doing clinically quite well with only mild cognitive impairment and right-sided peripheral vision loss.  MRI of the brain shows multiple microhemorrhages raising concern for underlying mild cerebral amyloid angiopathy hence patient is not a good long-term anticoagulation candidate.  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     PLAN:I had a  long discussion with the patient and his wife regarding his recent symptomatic seizures and discussed need to stay on Keppra  500 mg twice daily which she seems to be tolerating well without side effects likely lifelong.  He was advised not to drive for the next 6 months as per [place]  law and to avoid seizures provoking triggers like medication noncompliance, sleep deprivation, stimulants like alcohol.  His mild cognitive impairment and memory loss seems stable at present.  Encouraged him to continue increased participation in cognitively challenging activities like solving crossword puzzles, playing bridge and sudoku.  We also discussed memory compensation strategies.  Continue to stay off anticoagulation for his atrial fibrillation given his history of intracerebral hemorrhage and mild cerebral amyloid angiopathy which makes him a very poor long anticoagulation candidate.  Return for follow-up in the future in 6 practitioner or call earlier if necessary.   I personally spent a total of 45 minutes in the care of the patient today including getting/reviewing separately obtained history, performing a medically appropriate exam/evaluation, counseling and educating, placing orders, referring and communicating with other health care professionals, documenting clinical information in the EHR, independently interpreting results, and coordinating care.        Eather Popp, MD Note: This document was prepared with digital dictation and possible smart phrase technology. Any transcriptional errors that result from this process are unintentional

## 2024-02-14 NOTE — Patient Instructions (Signed)
 I had a long discussion with the patient and his wife regarding his recent symptomatic seizures and discussed need to stay on Keppra  500 mg twice daily which she seems to be tolerating well without side effects likely lifelong.  He was advised not to drive for the next 6 months as per   law and to avoid seizures provoking triggers like medication noncompliance, sleep deprivation, stimulants like alcohol.  His mild cognitive impairment and memory loss seems stable at present.  Encouraged him to continue increased participation in cognitively challenging activities like solving crossword puzzles, playing bridge and sudoku.  We also discussed memory compensation strategies.  Continue to stay off anticoagulation for his atrial fibrillation given his history of intracerebral hemorrhage and mild cerebral amyloid angiopathy which makes him a very poor long anticoagulation candidate.  Return for follow-up in the future in 6 practitioner or call earlier if necessary. Memory Compensation Strategies  Use WARM strategy.  W= write it down  A= associate it  R= repeat it  M= make a mental note  2.   You can keep a Glass blower/designer.  Use a 3-ring notebook with sections for the following: calendar, important names and phone numbers,  medications, doctors' names/phone numbers, lists/reminders, and a section to journal what you did  each day.   3.    Use a calendar to write appointments down.  4.    Write yourself a schedule for the day.  This can be placed on the calendar or in a separate section of the Memory Notebook.  Keeping a  regular schedule can help memory.  5.    Use medication organizer with sections for each day or morning/evening pills.  You may need help loading it  6.    Keep a basket, or pegboard by the door.  Place items that you need to take out with you in the basket or on the pegboard.  You may also want to  include a message board for reminders.  7.    Use sticky notes.  Place  sticky notes with reminders in a place where the task is performed.  For example:  turn off the  stove placed by the stove, lock the door placed on the door at eye level,  take your medications on  the bathroom mirror or by the place where you normally take your medications.  8.    Use alarms/timers.  Use while cooking to remind yourself to check on food or as a reminder to take your medicine, or as a  reminder to make a call, or as a reminder to perform another task, etc.

## 2024-02-20 ENCOUNTER — Telehealth: Payer: Self-pay | Admitting: Neurology

## 2024-02-20 NOTE — Telephone Encounter (Signed)
 Pt states he had an EEG while in [facility] a month before he saw Dr [name], pt is asking if based on that is it required that he has another, please call.

## 2024-02-20 NOTE — Telephone Encounter (Signed)
 Cld Pt, spoke w/wife (DPR) who is asking if Dr. [name] meant to order EEG since Pt had one in hospital on 01/14/24. Discussed w/wife that Dr. [name] reviewed the information and ordered the EEG based on his evaluation at [facility] on 02/14/24. Pt wife voiced understanding and thanks for the call back.

## 2024-02-27 ENCOUNTER — Ambulatory Visit: Admitting: Neurology

## 2024-02-27 DIAGNOSIS — G40009 Localization-related (focal) (partial) idiopathic epilepsy and epileptic syndromes with seizures of localized onset, not intractable, without status epilepticus: Secondary | ICD-10-CM

## 2024-02-29 ENCOUNTER — Ambulatory Visit: Payer: Medicare HMO | Admitting: Neurology

## 2024-03-05 ENCOUNTER — Other Ambulatory Visit: Payer: Self-pay | Admitting: Diagnostic Radiology

## 2024-03-05 DIAGNOSIS — I611 Nontraumatic intracerebral hemorrhage in hemisphere, cortical: Secondary | ICD-10-CM

## 2024-03-10 ENCOUNTER — Other Ambulatory Visit: Payer: Self-pay | Admitting: Internal Medicine

## 2024-03-10 DIAGNOSIS — I4891 Unspecified atrial fibrillation: Secondary | ICD-10-CM

## 2024-03-13 ENCOUNTER — Ambulatory Visit
Admission: RE | Admit: 2024-03-13 | Discharge: 2024-03-13 | Disposition: A | Discharge status: Home / Self Care | Source: Ambulatory Visit | Attending: Diagnostic Radiology | Admitting: Diagnostic Radiology

## 2024-03-13 ENCOUNTER — Ambulatory Visit: Payer: Self-pay | Admitting: Neurology

## 2024-03-13 DIAGNOSIS — I82503 Chronic embolism and thrombosis of unspecified deep veins of lower extremity, bilateral: Secondary | ICD-10-CM | POA: Diagnosis not present

## 2024-03-13 DIAGNOSIS — I611 Nontraumatic intracerebral hemorrhage in hemisphere, cortical: Secondary | ICD-10-CM

## 2024-03-20 ENCOUNTER — Ambulatory Visit
Admission: RE | Admit: 2024-03-20 | Discharge: 2024-03-20 | Disposition: A | Discharge status: Home / Self Care | Source: Ambulatory Visit | Attending: Diagnostic Radiology | Admitting: Diagnostic Radiology

## 2024-03-20 DIAGNOSIS — I611 Nontraumatic intracerebral hemorrhage in hemisphere, cortical: Secondary | ICD-10-CM

## 2024-03-20 DIAGNOSIS — I629 Nontraumatic intracranial hemorrhage, unspecified: Secondary | ICD-10-CM | POA: Diagnosis not present

## 2024-03-20 NOTE — Progress Notes (Signed)
 Chief Complaint: Patient was seen in consultation today for IVC filter follow up.  Referring Physician(s): Joshua Ned, MD  History of Present Illness: Mark Cohen is a 73 y.o. male who was diagnosed with small bilateral pulmonary emboli on 03/31/2023 and venous duplex demonstrated DVT in the right posterior tibial veins, right peroneal veins and left gastrocnemius veins on 04/01/2023.  Patient was also noted to have new onset of atrial fibrillation around the same time as the PE.  Patient was discharged on Xarelto .  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  Anticoagulation was stopped and an IVC filter was placed on 04/04/2023 with Dr. Norleen Roulette.    I talked to the patient 08/29/2023 on a remote consultation. We discussed IVC filter management.  At that time, the patient did not want to pursue having the IVC filter removed.  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  Patient reports that the left leg swelling has resolved.  Currently he is asymptomatic.  He is doing most of his normal activities.  He wanted to discuss the IVC filter again because he has heard concerning things about IVC filters. To my knowledge, the patient has not had a hypercoagulable workup.  Patient is not taking anticoagulation.  Patient had bilateral lower extremity venous duplex exam on 03/13/2024 and it was negative for DVT.  The etiology for the patient's DVT and PE ini 2024 is uncertain.  Not clear if it was provoked or unprovoked.    Past Medical History:  Diagnosis Date   Allergic rhinitis    Atrial fibrillation (HCC)    Colon polyps    Epididymal cyst    right   GERD (gastroesophageal reflux disease)    Hepatitis 1961   hepatitis a as child, no liver oproblems since   Hip pain    both    Past Surgical History:  Procedure Laterality Date   APPENDECTOMY     colonscopy   07/27/2016   EPIDIDYMECTOMY  08/29/2002   Sees Dr Chales twice a year left side   EPIDIDYMECTOMY Right 09/09/2016   Procedure: EPIDIDYMECTOMY;  Surgeon: Arlena Chales, MD;  Location: [facility];  Service: Urology;  Laterality: Right;   IR IVC FILTER PLMT / S&I /IMG GUID/MOD SED  04/04/2023   IR IVC FILTER RETRIEVAL / S&I /IMG GUID/MOD SED  04/04/2023   NASAL SINUS SURGERY  2008    Allergies: Patient has no known allergies.  Medications: Prior to Admission medications   Medication Sig Start Date End Date Taking? Authorizing Provider  ALPRAZolam  (XANAX ) 0.25 MG tablet Take 1 tablet (0.25 mg total) by mouth 2 (two) times daily as needed for anxiety. 01/06/24   [name], MD  Cholecalciferol  (DIALYVITE VITAMIN D  5000 PO) Take 1 tablet by mouth daily.    [provider]  diltiazem  (CARDIZEM  CD) 120 MG 24 hr capsule TAKE 1 CAPSULE BY MOUTH DAILY 03/13/24   [name], MD  esomeprazole  (NEXIUM ) 40 MG capsule TAKE 1 CAPSULE BY MOUTH DAILY Patient taking differently: Take 20 mg by mouth daily. 10/02/22   [name], MD  HYDROcodone -acetaminophen  (NORCO) 10-325 MG tablet Take 1 tablet by mouth every 6 (six) hours as needed for moderate pain (pain score 4-6). 01/10/24   [name], MD  levETIRAcetam  (KEPPRA ) 500 MG tablet Take 1 tablet (500 mg total) by mouth 2 (two) times daily. 02/14/24   [name],  [name], MD     Family History  Problem Relation Age of Onset   Arthritis Unknown    Stroke Unknown    Cancer Neg Hx     Social History   Socioeconomic History   Marital status: Married    Spouse name: Not on file   Number of children: Not on file   Years of education: Not on file   Highest education level: Bachelor's degree (e.g., BA, AB, BS)  Occupational History   Occupation: Retired  Tobacco Use   Smoking status: Former    Current packs/day: 1.00    Average packs/day: 1 pack/day for 7.0 years (7.0 ttl pk-yrs)    Types: Cigarettes    Passive exposure: Never    Smokeless tobacco: Never  Substance and Sexual Activity   Alcohol use: Yes    Alcohol/week: 21.0 standard drinks of alcohol    Types: 21 Cans of beer per week   Drug use: No   Sexual activity: Not Currently    Partners: Female  Other Topics Concern   Not on file  Social History Narrative   Regular Exercise -  YES         Social Drivers of Health   Financial Resource Strain: Patient Declined (02/05/2024)   Overall Financial Resource Strain (CARDIA)    Difficulty of Paying Living Expenses: Patient declined  Food Insecurity: No Food Insecurity (02/05/2024)   Hunger Vital Sign    Worried About Running Out of Food in the Last Year: Never true    Ran Out of Food in the Last Year: Never true  Transportation Needs: No Transportation Needs (02/05/2024)   PRAPARE - Administrator, Civil Service (Medical): No    Lack of Transportation (Non-Medical): No  Physical Activity: Unknown (02/05/2024)   Exercise Vital Sign    Days of Exercise per Week: Patient declined    Minutes of Exercise per Session: Not on file  Stress: Patient Declined (02/05/2024)   [employer] of Occupational Health - Occupational Stress Questionnaire    Feeling of Stress: Patient declined  Social Connections: Unknown (02/05/2024)   Social Connection and Isolation Panel    Frequency of Communication with Friends and Family: Patient declined    Frequency of Social Gatherings with Friends and Family: Patient declined    Attends Religious Services: Patient declined    Database administrator or Organizations: No    Attends Engineer, structural: Not on file    Marital Status: Married    ECOG Status: 1 - Symptomatic but completely ambulatory    Review of Systems  Constitutional: Negative.   Respiratory: Negative.    Cardiovascular: Negative.   Gastrointestinal: Negative.   Genitourinary: Negative.     Vital Signs: BP (!) 156/88 (BP Location: Left Arm, Patient Position: Sitting, Cuff Size:  Normal)   Pulse 70   Temp 98.3 F (36.8 C) (Oral)   Resp 18   SpO2 98%     Physical Exam Constitutional:      Appearance: He is not ill-appearing.  Cardiovascular:     Rate and Rhythm: Normal rate.  Pulmonary:     Effort: Pulmonary effort is normal.  Musculoskeletal:     Right lower leg: No edema.     Left lower leg: No edema.  Neurological:     Mental Status: He is alert.       Imaging: US  Venous Img Lower Bilateral (DVT) Result Date: 03/14/2024 CLINICAL DATA:  IVC filter in place.  Assess  for residual DVT. EXAM: BILATERAL LOWER EXTREMITY VENOUS DOPPLER ULTRASOUND TECHNIQUE: Gray-scale sonography with graded compression, as well as color Doppler and duplex ultrasound were performed to evaluate the lower extremity deep venous systems from the level of the common femoral vein and including the common femoral, femoral, profunda femoral, popliteal and calf veins including the posterior tibial, peroneal and gastrocnemius veins when visible. The superficial great saphenous vein was also interrogated. Spectral Doppler was utilized to evaluate flow at rest and with distal augmentation maneuvers in the common femoral, femoral and popliteal veins. COMPARISON:  None Available. FINDINGS: RIGHT LOWER EXTREMITY Common Femoral Vein: No evidence of thrombus. Normal compressibility, respiratory phasicity and response to augmentation. Saphenofemoral Junction: No evidence of thrombus. Normal compressibility and flow on color Doppler imaging. Profunda Femoral Vein: No evidence of thrombus. Normal compressibility and flow on color Doppler imaging. Femoral Vein: No evidence of thrombus. Normal compressibility, respiratory phasicity and response to augmentation. Popliteal Vein: No evidence of thrombus. Normal compressibility, respiratory phasicity and response to augmentation. Calf Veins: No evidence of thrombus. Normal compressibility and flow on color Doppler imaging. Superficial Great Saphenous Vein: No  evidence of thrombus. Normal compressibility. Venous Reflux:  None. Other Findings:  None. LEFT LOWER EXTREMITY Common Femoral Vein: No evidence of thrombus. Normal compressibility, respiratory phasicity and response to augmentation. Saphenofemoral Junction: No evidence of thrombus. Normal compressibility and flow on color Doppler imaging. Profunda Femoral Vein: No evidence of thrombus. Normal compressibility and flow on color Doppler imaging. Femoral Vein: No evidence of thrombus. Normal compressibility, respiratory phasicity and response to augmentation. Popliteal Vein: No evidence of thrombus. Normal compressibility, respiratory phasicity and response to augmentation. Calf Veins: No evidence of thrombus. Normal compressibility and flow on color Doppler imaging. Superficial Great Saphenous Vein: No evidence of thrombus. Normal compressibility. Venous Reflux:  None. Other Findings:  None. IMPRESSION: No evidence of deep venous thrombosis in either lower extremity. Electronically Signed   By: Wilkie Lent M.D.   On: 03/14/2024 08:27   EEG adult [facility] [address] [place]. [place] [address] [phone]      Electroencephalogram Procedure Note Mr. ALEZANDER DIMAANO Date of Birth:  Nov 29, 1950 Medical Record Number:  [id number] Indications: Diagnostic Date of Procedure 02/27/2024 Medications: levetiracetam  (Keppra ) Clinical history : 73 year old patient being evaluated for seizures Technical Description This study was performed using 17 channel digital electroencephalographic recording equipment. International 10-20 electrode placement was used. The record was obtained with the patient awake and drowsy.  The record is of fair technical quality for purposes of interpretation. Activation Procedures:  hyperventilation and photic stimulation . EEG Description Awake: Alpha Activity: The waking state record contains a well-defined bi-occipital alpha rhythm of  moderate amplitude with a dominant  frequency of 9 Hz. Reactivity is uncertain. No paroxsymal activity, spikes, or sharp waves are noted.  Intermittent 6 to 7 Hz temporoparietal slowing is noted on the left. Technical component of study is adequate. EKG tracing shows regular sinus rhythm Length of this recording is 26 minutes Sleep: With drowsiness, there is attenuation of the background alpha activity. As the patient enters into light sleep, vertex waves and symmetrical spindles are noted. K complexes are noted in sleep. Transition to the waking state is unremarkable. Result of Activation Procedures: Hyperventilation: Hyperventilation for three minutes fails to activate the recording. Photo Stimulation: No photic driving response is noted. Summary This is a mildly abnormal EEG due to the presence of focal left hemispheric slowing which may indicative of underlying structural damage but no definite  epileptiform activity was noted.    Labs:  CBC: Recent Labs    04/03/23 1907 04/06/23 1026 01/14/24 1206 01/14/24 1210 02/06/24 1443  WBC 7.2 7.4 11.3*  --  6.5  HGB 13.7  13.3 14.1 15.6 15.6 15.5  HCT 40.4  39.0 40.3 45.5 46.0 44.6  PLT 324 272 236  --  211.0    COAGS: Recent Labs    03/31/23 1740 04/01/23 0201 04/03/23 1907 01/14/24 1206  INR 1.9*  --  2.0* 1.1  APTT  --  76* 40* 26    BMP: Recent Labs    03/31/23 1740 04/03/23 1907 04/06/23 1026 01/14/24 1206 01/14/24 1210  NA 137 138  139 135 140 139  K 4.0 4.0  3.8 4.1 3.7 3.8  CL 102 106  105 100 102 108  CO2 26 23 25  11*  --   GLUCOSE 95 109*  102* 88 176* 170*  BUN 10 11  12 10 12 11   CALCIUM  9.2 9.1 9.1 9.4  --   CREATININE 0.89 0.89  0.80 0.87 1.15 0.90  GFRNONAA >60 >60 >60 >60  --     LIVER FUNCTION TESTS: Recent Labs    04/03/23 1907 01/14/24 1206  BILITOT 0.9 1.0  AST 26 36  ALT 16 19  ALKPHOS 76 86  PROT 6.8 7.0  ALBUMIN 3.7 4.3    TUMOR MARKERS: No results for input(s): AFPTM, CEA, CA199, CHROMGRNA in the last  8760 hours.  Assessment and Plan: 73 year old with history of small pulmonary emboli and bilateral DVT.  Patient was taken off anticoagulation and an IVC filter was placed due to to an intracranial hemorrhage.  Patient has no signs of thromboembolic disease at this time.  Recent lower extremity venous duplex is negative for DVT.  Patient has concerns about the IVC filter and wanted to have additional discussion.  We had a long discussion about the management of IVC filters and his particular situation.  It is not clear if the patient had a provoked or unprovoked DVT so it is difficult to assess his risk of recurrent thromboembolic disease.  In addition, the patient has not had a hypercoagulable workup or hematology evaluation.  I suspect the patient is low risk to have recurrent thromboembolic disease but it is uncertain.  Patient also expressed that he would like to avoid additional procedures if possible.  After a long discussion with the patient and his wife, they would like to keep the IVC filter for now.  No plans for scheduled follow-up.  They will contact interventional radiology as needed.  Electronically Signed: Juliene JONELLE Balder 03/20/2024, 5:40 PM   I spent a total of    25 Minutes in face to face in clinical consultation, greater than 50% of which was counseling/coordinating care for IVC filter management. Patient ID: KYROLLOS CORDELL, male   DOB: 10/13/1950, 73 y.o.   MRN: [id number]

## 2024-04-18 ENCOUNTER — Encounter: Payer: Self-pay | Admitting: Internal Medicine

## 2024-04-19 ENCOUNTER — Other Ambulatory Visit: Payer: Self-pay

## 2024-04-19 DIAGNOSIS — M150 Primary generalized (osteo)arthritis: Secondary | ICD-10-CM

## 2024-04-19 DIAGNOSIS — M5416 Radiculopathy, lumbar region: Secondary | ICD-10-CM

## 2024-04-19 DIAGNOSIS — M545 Low back pain, unspecified: Secondary | ICD-10-CM

## 2024-04-19 DIAGNOSIS — G8929 Other chronic pain: Secondary | ICD-10-CM

## 2024-04-19 MED ORDER — HYDROCODONE-ACETAMINOPHEN 10-325 MG PO TABS: 1.0000 | ORAL_TABLET | Freq: Four times a day (QID) | ORAL | 0 refills | Status: DC | PRN

## 2024-07-09 ENCOUNTER — Other Ambulatory Visit: Payer: Self-pay

## 2024-07-09 ENCOUNTER — Encounter: Payer: Self-pay | Admitting: Internal Medicine

## 2024-07-09 DIAGNOSIS — M545 Low back pain, unspecified: Secondary | ICD-10-CM

## 2024-07-09 DIAGNOSIS — M150 Primary generalized (osteo)arthritis: Secondary | ICD-10-CM

## 2024-07-09 DIAGNOSIS — M5416 Radiculopathy, lumbar region: Secondary | ICD-10-CM

## 2024-07-09 DIAGNOSIS — G8929 Other chronic pain: Secondary | ICD-10-CM

## 2024-08-08 ENCOUNTER — Encounter: Payer: Self-pay | Admitting: Internal Medicine

## 2024-08-08 ENCOUNTER — Ambulatory Visit: Admitting: Internal Medicine

## 2024-08-08 VITALS — BP 136/86 | HR 70 | Temp 97.8°F | Ht 72.0 in | Wt 168.2 lb

## 2024-08-08 DIAGNOSIS — I480 Paroxysmal atrial fibrillation: Secondary | ICD-10-CM

## 2024-08-08 DIAGNOSIS — M150 Primary generalized (osteo)arthritis: Secondary | ICD-10-CM

## 2024-08-08 DIAGNOSIS — M5416 Radiculopathy, lumbar region: Secondary | ICD-10-CM

## 2024-08-08 DIAGNOSIS — L819 Disorder of pigmentation, unspecified: Secondary | ICD-10-CM

## 2024-08-08 DIAGNOSIS — Z23 Encounter for immunization: Secondary | ICD-10-CM | POA: Insufficient documentation

## 2024-08-08 DIAGNOSIS — E785 Hyperlipidemia, unspecified: Secondary | ICD-10-CM

## 2024-08-08 DIAGNOSIS — Z0001 Encounter for general adult medical examination with abnormal findings: Secondary | ICD-10-CM

## 2024-08-08 DIAGNOSIS — G40909 Epilepsy, unspecified, not intractable, without status epilepticus: Secondary | ICD-10-CM | POA: Insufficient documentation

## 2024-08-08 DIAGNOSIS — G8929 Other chronic pain: Secondary | ICD-10-CM

## 2024-08-08 DIAGNOSIS — I10 Essential (primary) hypertension: Secondary | ICD-10-CM

## 2024-08-08 DIAGNOSIS — F411 Generalized anxiety disorder: Secondary | ICD-10-CM

## 2024-08-08 LAB — CBC WITH DIFFERENTIAL/PLATELET
Basophils Absolute: 0 10*3/uL (ref 0.0–0.1)
Basophils Relative: 0.4 % (ref 0.0–3.0)
Eosinophils Absolute: 0.1 10*3/uL (ref 0.0–0.7)
Eosinophils Relative: 2 % (ref 0.0–5.0)
HCT: 43.2 % (ref 39.0–52.0)
Hemoglobin: 14.9 g/dL (ref 13.0–17.0)
Lymphocytes Relative: 23.8 % (ref 12.0–46.0)
Lymphs Abs: 1.7 10*3/uL (ref 0.7–4.0)
MCHC: 34.6 g/dL (ref 30.0–36.0)
MCV: 90.5 fl (ref 78.0–100.0)
Monocytes Absolute: 0.4 10*3/uL (ref 0.1–1.0)
Monocytes Relative: 6 % (ref 3.0–12.0)
Neutro Abs: 4.7 10*3/uL (ref 1.4–7.7)
Neutrophils Relative %: 67.8 % (ref 43.0–77.0)
Platelets: 211 10*3/uL (ref 150.0–400.0)
RBC: 4.77 Mil/uL (ref 4.22–5.81)
RDW: 13.4 % (ref 11.5–15.5)
WBC: 7 10*3/uL (ref 4.0–10.5)

## 2024-08-08 LAB — BASIC METABOLIC PANEL WITH GFR
BUN: 16 mg/dL (ref 6–23)
CO2: 32 meq/L (ref 19–32)
Calcium: 9.9 mg/dL (ref 8.4–10.5)
Chloride: 102 meq/L (ref 96–112)
Creatinine, Ser: 0.88 mg/dL (ref 0.40–1.50)
GFR: 84.96 mL/min
Glucose, Bld: 93 mg/dL (ref 70–99)
Potassium: 4.3 meq/L (ref 3.5–5.1)
Sodium: 139 meq/L (ref 135–145)

## 2024-08-08 LAB — HEPATIC FUNCTION PANEL
ALT: 22 U/L (ref 3–53)
AST: 29 U/L (ref 5–37)
Albumin: 4.8 g/dL (ref 3.5–5.2)
Alkaline Phosphatase: 87 U/L (ref 39–117)
Bilirubin, Direct: 0.1 mg/dL (ref 0.1–0.3)
Total Bilirubin: 0.6 mg/dL (ref 0.2–1.2)
Total Protein: 7.4 g/dL (ref 6.0–8.3)

## 2024-08-08 LAB — LIPID PANEL
Cholesterol: 184 mg/dL (ref 28–200)
HDL: 89.8 mg/dL
LDL Cholesterol: 79 mg/dL (ref 10–99)
NonHDL: 94.32
Total CHOL/HDL Ratio: 2
Triglycerides: 76 mg/dL (ref 10.0–149.0)
VLDL: 15.2 mg/dL (ref 0.0–40.0)

## 2024-08-08 LAB — TSH: TSH: 1.68 u[IU]/mL (ref 0.35–5.50)

## 2024-08-08 MED ORDER — HYDROCODONE-ACETAMINOPHEN 10-325 MG PO TABS: 1.0000 | ORAL_TABLET | Freq: Four times a day (QID) | ORAL | 0 refills | Status: AC | PRN

## 2024-08-08 MED ORDER — ALPRAZOLAM 0.25 MG PO TABS: 0.2500 mg | ORAL_TABLET | Freq: Two times a day (BID) | ORAL | 3 refills | Status: AC | PRN

## 2024-08-08 MED ORDER — SHINGRIX 50 MCG/0.5ML IM SUSR: 0.5000 mL | Freq: Once | INTRAMUSCULAR | 1 refills | Status: AC

## 2024-08-08 NOTE — Patient Instructions (Signed)
 Hypertension, Adult High blood pressure (hypertension) is when the force of blood pumping through the arteries is too strong. The arteries are the blood vessels that carry blood from the heart throughout the body. Hypertension forces the heart to work harder to pump blood and may cause arteries to become narrow or stiff. Untreated or uncontrolled hypertension can lead to a heart attack, heart failure, a stroke, kidney disease, and other problems. A blood pressure reading consists of a higher number over a lower number. Ideally, your blood pressure should be below 120/80. The first ("top") number is called the systolic pressure. It is a measure of the pressure in your arteries as your heart beats. The second ("bottom") number is called the diastolic pressure. It is a measure of the pressure in your arteries as the heart relaxes. What are the causes? The exact cause of this condition is not known. There are some conditions that result in high blood pressure. What increases the risk? Certain factors may make you more likely to develop high blood pressure. Some of these risk factors are under your control, including: Smoking. Not getting enough exercise or physical activity. Being overweight. Having too much fat, sugar, calories, or salt (sodium) in your diet. Drinking too much alcohol. Other risk factors include: Having a personal history of heart disease, diabetes, high cholesterol, or kidney disease. Stress. Having a family history of high blood pressure and high cholesterol. Having obstructive sleep apnea. Age. The risk increases with age. What are the signs or symptoms? High blood pressure may not cause symptoms. Very high blood pressure (hypertensive crisis) may cause: Headache. Fast or irregular heartbeats (palpitations). Shortness of breath. Nosebleed. Nausea and vomiting. Vision changes. Severe chest pain, dizziness, and seizures. How is this diagnosed? This condition is diagnosed by  measuring your blood pressure while you are seated, with your arm resting on a flat surface, your legs uncrossed, and your feet flat on the floor. The cuff of the blood pressure monitor will be placed directly against the skin of your upper arm at the level of your heart. Blood pressure should be measured at least twice using the same arm. Certain conditions can cause a difference in blood pressure between your right and left arms. If you have a high blood pressure reading during one visit or you have normal blood pressure with other risk factors, you may be asked to: Return on a different day to have your blood pressure checked again. Monitor your blood pressure at home for 1 week or longer. If you are diagnosed with hypertension, you may have other blood or imaging tests to help your health care provider understand your overall risk for other conditions. How is this treated? This condition is treated by making healthy lifestyle changes, such as eating healthy foods, exercising more, and reducing your alcohol intake. You may be referred for counseling on a healthy diet and physical activity. Your health care provider may prescribe medicine if lifestyle changes are not enough to get your blood pressure under control and if: Your systolic blood pressure is above 130. Your diastolic blood pressure is above 80. Your personal target blood pressure may vary depending on your medical conditions, your age, and other factors. Follow these instructions at home: Eating and drinking  Eat a diet that is high in fiber and potassium, and low in sodium, added sugar, and fat. An example of this eating plan is called the DASH diet. DASH stands for Dietary Approaches to Stop Hypertension. To eat this way: Eat  plenty of fresh fruits and vegetables. Try to fill one half of your plate at each meal with fruits and vegetables. Eat whole grains, such as whole-wheat pasta, brown rice, or whole-grain bread. Fill about one  fourth of your plate with whole grains. Eat or drink low-fat dairy products, such as skim milk or low-fat yogurt. Avoid fatty cuts of meat, processed or cured meats, and poultry with skin. Fill about one fourth of your plate with lean proteins, such as fish, chicken without skin, beans, eggs, or tofu. Avoid pre-made and processed foods. These tend to be higher in sodium, added sugar, and fat. Reduce your daily sodium intake. Many people with hypertension should eat less than 1,500 mg of sodium a day. Do not drink alcohol if: Your health care provider tells you not to drink. You are pregnant, may be pregnant, or are planning to become pregnant. If you drink alcohol: Limit how much you have to: 0-1 drink a day for women. 0-2 drinks a day for men. Know how much alcohol is in your drink. In the [place] one drink equals one 12 oz bottle of beer (355 mL), one 5 oz glass of wine (148 mL), or one 1 oz glass of hard liquor (44 mL). Lifestyle  Work with your health care provider to maintain a healthy body weight or to lose weight. Ask what an ideal weight is for you. Get at least 30 minutes of exercise that causes your heart to beat faster (aerobic exercise) most days of the week. Activities may include walking, swimming, or biking. Include exercise to strengthen your muscles (resistance exercise), such as Pilates or lifting weights, as part of your weekly exercise routine. Try to do these types of exercises for 30 minutes at least 3 days a week. Do not use any products that contain nicotine or tobacco. These products include cigarettes, chewing tobacco, and vaping devices, such as e-cigarettes. If you need help quitting, ask your health care provider. Monitor your blood pressure at home as told by your health care provider. Keep all follow-up visits. This is important. Medicines Take over-the-counter and prescription medicines only as told by your health care provider. Follow directions carefully. Blood  pressure medicines must be taken as prescribed. Do not skip doses of blood pressure medicine. Doing this puts you at risk for problems and can make the medicine less effective. Ask your health care provider about side effects or reactions to medicines that you should watch for. Contact a health care provider if you: Think you are having a reaction to a medicine you are taking. Have headaches that keep coming back (recurring). Feel dizzy. Have swelling in your ankles. Have trouble with your vision. Get help right away if you: Develop a severe headache or confusion. Have unusual weakness or numbness. Feel faint. Have severe pain in your chest or abdomen. Vomit repeatedly. Have trouble breathing. These symptoms may be an emergency. Get help right away. Call 911. Do not wait to see if the symptoms will go away. Do not drive yourself to the hospital. Summary Hypertension is when the force of blood pumping through your arteries is too strong. If this condition is not controlled, it may put you at risk for serious complications. Your personal target blood pressure may vary depending on your medical conditions, your age, and other factors. For most people, a normal blood pressure is less than 120/80. Hypertension is treated with lifestyle changes, medicines, or a combination of both. Lifestyle changes include losing weight, eating a healthy,  low-sodium diet, exercising more, and limiting alcohol. This information is not intended to replace advice given to you by your health care provider. Make sure you discuss any questions you have with your health care provider. Document Revised: 05/04/2021 Document Reviewed: 05/04/2021 Elsevier Patient Education  2024 [employer].

## 2024-08-08 NOTE — Progress Notes (Unsigned)
 "     Subjective:  Patient ID: Mark Cohen, male    DOB: 1950/11/11  Age: 74 y.o. MRN: [id number]  CC: Hypertension (6 month follow up, medication refills, )   HPI Mark Cohen presents for f/up---  Discussed the use of AI scribe software for clinical note transcription with the patient, who gave verbal consent to proceed.  History of Present Illness Mark Cohen is a 74 year old male who presents for a medication refill and follow-up on his chronic pain management.  He experiences chronic pain, described as 'a little heavy' on some days. He takes two 10 mg hydrocodone  tablets daily and uses ice packs in the morning and heating pads to manage arthritis-related soreness. The pain tends to increase around noon. Despite the pain, he remains active, engaging in yard work and walking 2.2 to 3 miles regularly.  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 An ultrasound on February 12th and September 3rd showed no evidence of blood clots. He discussed the potential removal of the IVC filter but it was decided to leave it in place for now.  He experienced a seizure on July 6th and was started on seizure medication, which is working well. An EEG on August 19th showed no changes compared to the hospital results from July 6th. He has an appointment with his neurologist's PA on February 23rd. He takes his seizure medication at 10 AM and 10 PM daily and has an alarm set to avoid missing doses.  He reports issues with sleep, waking up every hour or two. He uses Xanax  for anxiety and occasionally for sleep, taking two 0.25 mg tablets when needed.  He recently changed to a [facility] plan and needs a referral to see a dermatologist, as he has not had a dermatology check-up in two years. He previously saw a PA named Burnard Forbes, now with Dr. Tod office.  No chest pain, shortness of breath, dizziness, or lightheadedness during exertion. He mentions being  hyperactive and needing to remind himself to 'calm down and do like a normal person.'     Outpatient Medications Prior to Visit  Medication Sig Dispense Refill   diltiazem  (CARDIZEM  CD) 120 MG 24 hr capsule TAKE 1 CAPSULE BY MOUTH DAILY 90 capsule 1   levETIRAcetam  (KEPPRA ) 500 MG tablet Take 1 tablet (500 mg total) by mouth 2 (two) times daily. 60 tablet 5   omeprazole (PRILOSEC) 20 MG capsule Take 20 mg by mouth daily.     VITAMIN D  PO Take 2,000 mg by mouth daily.     ALPRAZolam  (XANAX ) 0.25 MG tablet Take 1 tablet (0.25 mg total) by mouth 2 (two) times daily as needed for anxiety. 60 tablet 1   Cholecalciferol  (DIALYVITE VITAMIN D  5000 PO) Take 1 tablet by mouth daily.     HYDROcodone -acetaminophen  (NORCO) 10-325 MG tablet Take 1 tablet by mouth every 6 (six) hours as needed for moderate pain (pain score 4-6). 120 tablet 0   esomeprazole  (NEXIUM ) 40 MG capsule TAKE 1 CAPSULE BY MOUTH DAILY (Patient taking differently: Take 20 mg by mouth daily.) 90 capsule 1   No facility-administered medications prior to visit.    ROS Review of Systems  Constitutional:  Negative for appetite change, chills, diaphoresis, fatigue and fever.  HENT: Negative.    Eyes: Negative.   Respiratory:  Negative for cough, chest tightness, shortness of breath and wheezing.   Cardiovascular:  Negative for chest pain, palpitations  and leg swelling.  Gastrointestinal: Negative.  Negative for abdominal pain, constipation, diarrhea, nausea and vomiting.  Endocrine: Negative.   Genitourinary: Negative.  Negative for difficulty urinating and dysuria.  Musculoskeletal:  Positive for arthralgias and back pain. Negative for gait problem, joint swelling and myalgias.  Neurological: Negative.  Negative for dizziness, seizures, syncope, weakness, light-headedness and numbness.  Hematological:  Negative for adenopathy. Does not bruise/bleed easily.  Psychiatric/Behavioral:  Positive for sleep disturbance. Negative for  behavioral problems, confusion, decreased concentration, dysphoric mood and suicidal ideas. The patient is nervous/anxious.     Objective:  BP 136/86 (BP Location: Left Arm, Patient Position: Sitting, Cuff Size: Normal)   Pulse 70   Temp 97.8 F (36.6 C) (Oral)   Ht 6' (1.829 m)   Wt 168 lb 3.2 oz (76.3 kg)   SpO2 99%   BMI 22.81 kg/m   BP Readings from Last 3 Encounters:  08/08/24 136/86  03/20/24 (!) 156/88  02/14/24 117/73    Wt Readings from Last 3 Encounters:  08/08/24 168 lb 3.2 oz (76.3 kg)  02/14/24 167 lb 12.8 oz (76.1 kg)  02/06/24 163 lb 9.6 oz (74.2 kg)    Physical Exam Vitals reviewed.  Constitutional:      Appearance: Normal appearance.  HENT:     Nose: Nose normal.     Mouth/Throat:     Mouth: Mucous membranes are moist.  Eyes:     General: No scleral icterus.    Conjunctiva/sclera: Conjunctivae normal.  Cardiovascular:     Rate and Rhythm: Normal rate and regular rhythm.     Heart sounds: No murmur heard.    No friction rub. No gallop.     Comments: EKG- NSR, 61 bpm No LVH, Q waves, or ST/T wave changes  Pulmonary:     Effort: Pulmonary effort is normal.     Breath sounds: No stridor. No wheezing, rhonchi or rales.  Abdominal:     General: Abdomen is flat.     Palpations: There is no mass.     Tenderness: There is no abdominal tenderness. There is no guarding.     Hernia: No hernia is present.  Musculoskeletal:        General: No swelling. Normal range of motion.     Cervical back: Neck supple.     Right lower leg: No edema.     Left lower leg: No edema.  Lymphadenopathy:     Cervical: No cervical adenopathy.  Skin:    Coloration: Skin is not jaundiced.     Findings: Lesion present.  Neurological:     General: No focal deficit present.     Mental Status: He is alert. Mental status is at baseline.  Psychiatric:        Attention and Perception: He is inattentive.        Mood and Affect: Mood is anxious. Mood is not depressed.         Speech: Speech normal.        Behavior: Behavior normal.        Thought Content: Thought content normal.     Lab Results  Component Value Date   WBC 7.0 08/08/2024   HGB 14.9 08/08/2024   HCT 43.2 08/08/2024   PLT 211.0 08/08/2024   GLUCOSE 93 08/08/2024   CHOL 184 08/08/2024   TRIG 76.0 08/08/2024   HDL 89.80 08/08/2024   LDLCALC 79 08/08/2024   ALT 22 08/08/2024   AST 29 08/08/2024   NA 139 08/08/2024  K 4.3 08/08/2024   CL 102 08/08/2024   CREATININE 0.88 08/08/2024   BUN 16 08/08/2024   CO2 32 08/08/2024   TSH 1.68 08/08/2024   PSA 0.13 02/06/2024   INR 1.1 01/14/2024   HGBA1C 5.7 02/06/2024    IR Radiologist Eval & Mgmt Result Date: 03/20/2024 : See note in Epic. Electronically Signed   By: Juliene Balder M.D.   On: 03/20/2024 17:58   The 10-year ASCVD risk score (Arnett DK, et al., 2019) is: 23.7%   Values used to calculate the score:     Age: 26 years     Clinically relevant sex: Male     Is Non-Hispanic African American: No     Diabetic: No     Tobacco smoker: No     Systolic Blood Pressure: 136 mmHg     Is BP treated: Yes     HDL Cholesterol: 89.8 mg/dL     Total Cholesterol: 184 mg/dL    Assessment & Plan:   Essential hypertension, benign- BP is well controlled. -     EKG 12-Lead -     Basic metabolic panel with GFR; Future -     CBC with Differential/Platelet; Future -     TSH; Future  Hyperlipidemia with target LDL less than 130- He will not take a statin. -     Lipid panel; Future -     Hepatic function panel; Future -     TSH; Future  GAD (generalized anxiety disorder) -     TSH; Future -     ALPRAZolam ; Take 1 tablet (0.25 mg total) by mouth 2 (two) times daily as needed for anxiety.  Dispense: 60 tablet; Refill: 3  Encounter for general adult medical examination with abnormal findings- Exam completed, labs reviewed, vaccines reviewed and updated, cancer screenings are UTD, pt ed material was given.   Paroxysmal atrial fibrillation (HCC)-  He has good R/R control. -     Basic metabolic panel with GFR; Future  Seizure disorder [facility])- Seizure free. -     Levetiracetam  level; Future  Need for prophylactic vaccination and inoculation against varicella -     Shingrix ; Inject 0.5 mLs into the muscle once for 1 dose.  Dispense: 0.5 mL; Refill: 1  Primary osteoarthritis involving multiple joints -     HYDROcodone -Acetaminophen ; Take 1 tablet by mouth every 6 (six) hours as needed for moderate pain (pain score 4-6).  Dispense: 120 tablet; Refill: 0  Chronic bilateral low back pain without sciatica -     HYDROcodone -Acetaminophen ; Take 1 tablet by mouth every 6 (six) hours as needed for moderate pain (pain score 4-6).  Dispense: 120 tablet; Refill: 0  Chronic pain of both hips -     HYDROcodone -Acetaminophen ; Take 1 tablet by mouth every 6 (six) hours as needed for moderate pain (pain score 4-6).  Dispense: 120 tablet; Refill: 0  Left lumbar radiculitis -     HYDROcodone -Acetaminophen ; Take 1 tablet by mouth every 6 (six) hours as needed for moderate pain (pain score 4-6).  Dispense: 120 tablet; Refill: 0  Pigmented skin lesions -     Ambulatory referral to Dermatology     Follow-up: Return in about 4 months (around 12/06/2024).  Debby Molt, MD "

## 2024-08-14 ENCOUNTER — Ambulatory Visit: Payer: Self-pay | Admitting: Internal Medicine

## 2024-08-14 LAB — LEVETIRACETAM LEVEL: Keppra (Levetiracetam): 12.9 ug/mL

## 2024-08-14 MED ORDER — LEVETIRACETAM 500 MG PO TABS: 500.0000 mg | ORAL_TABLET | Freq: Two times a day (BID) | ORAL | 0 refills | Status: AC

## 2024-09-02 ENCOUNTER — Ambulatory Visit: Admitting: Family Medicine
